# Patient Record
Sex: Female | Born: 1990 | Race: Black or African American | Hispanic: No | Marital: Single | Smoking: Current every day smoker
Health system: Southern US, Community
[De-identification: ages and names within clinical notes are randomized; demographics above are authoritative.]

## PROBLEM LIST (undated history)

## (undated) ENCOUNTER — Inpatient Hospital Stay (HOSPITAL_COMMUNITY): Payer: Self-pay

## (undated) DIAGNOSIS — F3281 Premenstrual dysphoric disorder: Secondary | ICD-10-CM

## (undated) DIAGNOSIS — G44009 Cluster headache syndrome, unspecified, not intractable: Secondary | ICD-10-CM

## (undated) DIAGNOSIS — R112 Nausea with vomiting, unspecified: Secondary | ICD-10-CM

## (undated) DIAGNOSIS — I10 Essential (primary) hypertension: Secondary | ICD-10-CM

## (undated) DIAGNOSIS — Z20828 Contact with and (suspected) exposure to other viral communicable diseases: Secondary | ICD-10-CM

## (undated) DIAGNOSIS — N390 Urinary tract infection, site not specified: Secondary | ICD-10-CM

## (undated) HISTORY — PX: NO PAST SURGERIES: SHX2092

## (undated) HISTORY — PX: WISDOM TOOTH EXTRACTION: SHX21

## (undated) HISTORY — DX: Essential (primary) hypertension: I10

---
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
## 2011-04-09 ENCOUNTER — Emergency Department (HOSPITAL_COMMUNITY)
Admission: EM | Admit: 2011-04-09 | Discharge: 2011-04-10 | Disposition: A | Discharge status: Home / Self Care | Payer: Medicaid Other | Attending: Emergency Medicine | Admitting: Emergency Medicine

## 2011-04-09 DIAGNOSIS — R070 Pain in throat: Secondary | ICD-10-CM | POA: Insufficient documentation

## 2011-04-09 MED ORDER — ONDANSETRON 8 MG PO TBDP
8.0000 mg | ORAL_TABLET | Freq: Once | ORAL | Status: AC
  Administered 2011-04-09: 8 mg via ORAL
  Filled 2011-04-09: qty 1

## 2011-04-09 NOTE — ED Notes (Signed)
Sore thraot and chills for one day

## 2011-04-09 NOTE — ED Notes (Signed)
Pt also vomiting as well.  Symptoms started today.

## 2011-04-10 NOTE — ED Notes (Signed)
Pt's mother wanting to leave, AMA paper signed

## 2011-04-11 ENCOUNTER — Encounter (HOSPITAL_COMMUNITY): Payer: Self-pay | Admitting: *Deleted

## 2011-04-11 ENCOUNTER — Emergency Department (HOSPITAL_COMMUNITY)
Admission: EM | Admit: 2011-04-11 | Discharge: 2011-04-11 | Disposition: A | Discharge status: Home / Self Care | Payer: Medicaid Other | Attending: Emergency Medicine | Admitting: Emergency Medicine

## 2011-04-11 DIAGNOSIS — R52 Pain, unspecified: Secondary | ICD-10-CM | POA: Insufficient documentation

## 2011-04-11 DIAGNOSIS — R509 Fever, unspecified: Secondary | ICD-10-CM | POA: Insufficient documentation

## 2011-04-11 DIAGNOSIS — J029 Acute pharyngitis, unspecified: Secondary | ICD-10-CM | POA: Insufficient documentation

## 2011-04-11 DIAGNOSIS — R1110 Vomiting, unspecified: Secondary | ICD-10-CM | POA: Insufficient documentation

## 2011-04-11 DIAGNOSIS — E860 Dehydration: Secondary | ICD-10-CM | POA: Insufficient documentation

## 2011-04-11 LAB — URINALYSIS, ROUTINE W REFLEX MICROSCOPIC
Glucose, UA: NEGATIVE mg/dL
Leukocytes, UA: NEGATIVE
Nitrite: NEGATIVE
Protein, ur: 30 mg/dL — AB
Specific Gravity, Urine: 1.025 (ref 1.005–1.030)

## 2011-04-11 LAB — BASIC METABOLIC PANEL
BUN: 9 mg/dL (ref 6–23)
Calcium: 9.2 mg/dL (ref 8.4–10.5)
Creatinine, Ser: 0.74 mg/dL (ref 0.50–1.10)

## 2011-04-11 LAB — URINE MICROSCOPIC-ADD ON

## 2011-04-11 MED ORDER — ACETAMINOPHEN 160 MG/5ML PO SOLN
650.0000 mg | Freq: Once | ORAL | Status: AC
  Administered 2011-04-11: 650 mg via ORAL
  Filled 2011-04-11: qty 20.3

## 2011-04-11 MED ORDER — POTASSIUM CHLORIDE CRYS ER 20 MEQ PO TBCR
20.0000 meq | EXTENDED_RELEASE_TABLET | Freq: Once | ORAL | Status: AC
  Administered 2011-04-11: 20 meq via ORAL
  Filled 2011-04-11: qty 1

## 2011-04-11 MED ORDER — IBUPROFEN 800 MG PO TABS
800.0000 mg | ORAL_TABLET | Freq: Once | ORAL | Status: AC
  Administered 2011-04-11: 800 mg via ORAL
  Filled 2011-04-11: qty 1

## 2011-04-11 MED ORDER — IBUPROFEN 800 MG PO TABS: 800.0000 mg | ORAL_TABLET | Freq: Three times a day (TID) | ORAL | Status: AC | PRN

## 2011-04-11 MED ORDER — SODIUM CHLORIDE 0.9 % IV BOLUS (SEPSIS)
1000.0000 mL | Freq: Once | INTRAVENOUS | Status: AC
  Administered 2011-04-11: 1000 mL via INTRAVENOUS

## 2011-04-11 NOTE — ED Notes (Signed)
Pt states sore throat began yesterday, states feel like something is blocking thoat, feels swollen.  Pt states vomiting. Last vomited last night. Fever of 102.5 in triage. HR 127

## 2011-04-11 NOTE — ED Provider Notes (Signed)
History     Chief Complaint  Patient presents with  . Fever    sore thorat, body aches, vomiting   HPI Comments: She was seen here 2 days ago and her strep test was negative.  She has continued throat pain and has had difficulty eating and drinking fluids.  She feels weak and dehydrated.  Patient is a 20 y.o. female presenting with fever. The history is provided by the patient and a parent.  Fever Primary symptoms of the febrile illness include fever. Primary symptoms do not include headaches, shortness of breath, abdominal pain, nausea, arthralgias or rash. The current episode started 2 days ago. This is a new problem. The problem has not changed since onset. The fever began 2 days ago. The maximum temperature recorded prior to her arrival was 102 to 102.9 F.  Risk factors: none.Primary symptoms comment: sore throat and nasal congestion    History reviewed. No pertinent past medical history.  History reviewed. No pertinent past surgical history.  No family history on file.  History  Substance Use Topics  . Smoking status: Never Smoker   . Smokeless tobacco: Not on file  . Alcohol Use: No    OB History    Grav Para Term Preterm Abortions TAB SAB Ect Mult Living                  Review of Systems  Constitutional: Positive for fever.  HENT: Positive for congestion, sore throat, rhinorrhea and trouble swallowing. Negative for neck pain, neck stiffness and ear discharge.   Eyes: Negative.   Respiratory: Negative for chest tightness and shortness of breath.   Cardiovascular: Negative for chest pain.  Gastrointestinal: Negative for nausea and abdominal pain.  Genitourinary: Negative.   Musculoskeletal: Negative for joint swelling and arthralgias.  Skin: Negative.  Negative for rash and wound.  Neurological: Positive for weakness. Negative for dizziness, light-headedness, numbness and headaches.  Hematological: Negative.   Psychiatric/Behavioral: Negative.     Physical  Exam  BP 137/86  Pulse 126  Temp(Src) 99.7 F (37.6 C) (Oral)  Resp 20  Ht 5' 2.5" (1.588 m)  Wt 172 lb (78.019 kg)  BMI 30.96 kg/m2  SpO2 100%  LMP 04/06/2011  Physical Exam  Vitals reviewed. Constitutional: She is oriented to person, place, and time. She appears well-developed and well-nourished. No distress.  HENT:  Head: Normocephalic and atraumatic.  Right Ear: Tympanic membrane, external ear and ear canal normal.  Left Ear: Tympanic membrane, external ear and ear canal normal.  Nose: Mucosal edema and rhinorrhea present.  Mouth/Throat: Uvula is midline and mucous membranes are normal. Posterior oropharyngeal erythema present. No oropharyngeal exudate, posterior oropharyngeal edema or tonsillar abscesses.  Eyes: Conjunctivae are normal.  Neck: Normal range of motion. Neck supple.  Cardiovascular: Normal rate, regular rhythm, normal heart sounds and intact distal pulses.   Pulmonary/Chest: Effort normal and breath sounds normal. She has no wheezes.  Abdominal: Soft. Bowel sounds are normal. There is no tenderness.  Musculoskeletal: Normal range of motion.  Neurological: She is alert and oriented to person, place, and time.  Skin: Skin is warm and dry.  Psychiatric: She has a normal mood and affect.    ED Course  Procedures  MDM       Candis Musa, [place] 04/11/11 1727

## 2011-06-09 NOTE — ED Provider Notes (Signed)
Medical screening examination/treatment/procedure(s) were performed by non-physician practitioner and as supervising physician I was immediately available for consultation/collaboration.  Nicholes Stairs, MD 06/09/11 1540

## 2011-07-04 LAB — DIFFERENTIAL
Eosinophils Absolute: 0.1 — ABNORMAL LOW
Lymphocytes Relative: 43
Monocytes Relative: 9
Neutro Abs: 3.5
Neutrophils Relative %: 46

## 2011-07-04 LAB — BASIC METABOLIC PANEL
BUN: 11
Calcium: 9.3
Creatinine, Ser: 0.68
Potassium: 4

## 2011-07-04 LAB — CBC
HCT: 34.3
MCHC: 31.6
MCV: 77.7

## 2011-09-10 ENCOUNTER — Emergency Department (HOSPITAL_COMMUNITY)
Admission: EM | Admit: 2011-09-10 | Discharge: 2011-09-10 | Disposition: A | Discharge status: Home / Self Care | Payer: Self-pay | Attending: Emergency Medicine | Admitting: Emergency Medicine

## 2011-09-10 ENCOUNTER — Encounter (HOSPITAL_COMMUNITY): Payer: Self-pay | Admitting: Emergency Medicine

## 2011-09-10 DIAGNOSIS — B9789 Other viral agents as the cause of diseases classified elsewhere: Secondary | ICD-10-CM | POA: Insufficient documentation

## 2011-09-10 DIAGNOSIS — B349 Viral infection, unspecified: Secondary | ICD-10-CM

## 2011-09-10 HISTORY — DX: Contact with and (suspected) exposure to other viral communicable diseases: Z20.828

## 2011-09-10 HISTORY — DX: Cluster headache syndrome, unspecified, not intractable: G44.009

## 2011-09-10 MED ORDER — IBUPROFEN 400 MG PO TABS
400.0000 mg | ORAL_TABLET | Freq: Once | ORAL | Status: AC
  Administered 2011-09-10: 400 mg via ORAL
  Filled 2011-09-10: qty 1

## 2011-09-10 MED ORDER — ACETAMINOPHEN 500 MG PO TABS
1000.0000 mg | ORAL_TABLET | Freq: Once | ORAL | Status: AC
  Administered 2011-09-10: 1000 mg via ORAL
  Filled 2011-09-10: qty 2

## 2011-09-10 NOTE — ED Notes (Signed)
Pt c/o fever body aches runny eyes and cough. Pt states she was exposed to flu at work.

## 2011-09-10 NOTE — ED Provider Notes (Signed)
History     CSN: [id number] Arrival date & time: 09/10/2011  4:27 PM      Chief Complaint  Patient presents with  . Headache  . Fever  . Emesis  . Nausea    HPI Pt was seen at 1755.  Per pt, c/o gradual onset and persistence of constant runny/stuffy nose, sinus congestion, generalized body aches/fatigue, home fevers/chills, cough with post-tussive emesis, nausea, and "runny eyes" for the past day.  Pt states she was exposed to the flu at work, with 4 people having the same symptoms.  Denies CP/SOB, no abd pain, no diarrhea, no rash.    Past Medical History  Diagnosis Date  . 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 Headaches, cluster     History reviewed. No pertinent past surgical history.   History  Substance Use Topics  . Smoking status: Never Smoker   . Smokeless tobacco: Not on file  . Alcohol Use: No    Review of Systems ROS: Statement: All systems negative except as marked or noted in the HPI; Constitutional: +fever and chills, generalized body aches/fatigue. ; ; Eyes: Negative for eye pain, redness and discharge. ; ; ENMT: Negative for ear pain, hoarseness, sore throat, +rhinorrhea, nasal congestion, sinus pressure. ; ; Cardiovascular: Negative for chest pain, palpitations, diaphoresis, dyspnea and peripheral edema. ; ; Respiratory: +cough. Negative for wheezing and stridor. ; ; Gastrointestinal: +post tussive emesis, nausea.  Negative for diarrhea, abdominal pain, blood in stool, hematemesis, jaundice and rectal bleeding. . ; ; Genitourinary: Negative for dysuria, flank pain and hematuria. ; ; Musculoskeletal: +LBP.  Negative for neck pain. Negative for swelling and trauma.; ; Skin: Negative for pruritus, rash, abrasions, blisters, bruising and skin lesion.; ; Neuro: Negative for headache, lightheadedness and neck stiffness. Negative for weakness, altered level of consciousness , altered mental status, extremity weakness, paresthesias, involuntary movement, seizure and syncope.     Allergies    Latex  Home Medications  No current outpatient prescriptions on file.  BP 135/84  Pulse 115  Temp(Src) 101.5 F (38.6 C) (Oral)  Resp 20  Ht 5\' 2"  (1.575 m)  Wt 155 lb 5 oz (70.449 kg)  BMI 28.41 kg/m2  SpO2 100%  LMP 08/30/2011  Physical Exam 1800: Physical examination:  Nursing notes reviewed; Vital signs and O2 SAT reviewed;  Constitutional: Well developed, Well nourished, Well hydrated, In no acute distress; Head:  Normocephalic, atraumatic; Eyes: EOMI, PERRL, No scleral icterus; ENMT: TM's clear bilat.  +edemetous nasal turbinates bilat with clear rhinorrhea. Mouth and pharynx normal, Mucous membranes moist; Neck: Supple, Full range of motion, No lymphadenopathy; Cardiovascular: Tachycardia rate and regular rhythm, No murmur, rub, or gallop; Respiratory: Breath sounds clear & equal bilaterally, No rales, rhonchi, wheezes, or rub, Normal respiratory effort/excursion; Chest: Nontender, Movement normal; Abdomen: Soft, Nontender, Nondistended, Normal bowel sounds; Genitourinary: No CVA tenderness; Spine:  No midline CS, TS, LS tenderness.  +TTP bilat lower lumbar paraspinal muscles.  Extremities: Pulses normal, No tenderness, No edema, No calf edema or asymmetry.; Neuro: AA&Ox3, Major CN grossly intact.  No gross focal motor or sensory deficits in extremities.; Skin: Color normal, Warm, Dry, no rash, no petechiae.   ED Course  Procedures   MDM  MDM Reviewed: nursing note and vitals   Appears viral process at this time, will tx symptomatically.       Cohen Doleman Allison Quarry, DO 09/11/11 1947

## 2011-11-10 ENCOUNTER — Emergency Department (HOSPITAL_COMMUNITY)
Admission: EM | Admit: 2011-11-10 | Discharge: 2011-11-11 | Disposition: A | Discharge status: Home / Self Care | Payer: Self-pay | Attending: Emergency Medicine | Admitting: Emergency Medicine

## 2011-11-10 ENCOUNTER — Encounter (HOSPITAL_COMMUNITY): Payer: Self-pay | Admitting: *Deleted

## 2011-11-10 ENCOUNTER — Emergency Department (HOSPITAL_COMMUNITY): Payer: Self-pay

## 2011-11-10 DIAGNOSIS — M25476 Effusion, unspecified foot: Secondary | ICD-10-CM | POA: Insufficient documentation

## 2011-11-10 DIAGNOSIS — S82831A Other fracture of upper and lower end of right fibula, initial encounter for closed fracture: Secondary | ICD-10-CM

## 2011-11-10 DIAGNOSIS — W19XXXA Unspecified fall, initial encounter: Secondary | ICD-10-CM | POA: Insufficient documentation

## 2011-11-10 DIAGNOSIS — M25473 Effusion, unspecified ankle: Secondary | ICD-10-CM | POA: Insufficient documentation

## 2011-11-10 DIAGNOSIS — S82899A Other fracture of unspecified lower leg, initial encounter for closed fracture: Secondary | ICD-10-CM | POA: Insufficient documentation

## 2011-11-10 DIAGNOSIS — M25579 Pain in unspecified ankle and joints of unspecified foot: Secondary | ICD-10-CM | POA: Insufficient documentation

## 2011-11-10 MED ORDER — HYDROCODONE-ACETAMINOPHEN 5-325 MG PO TABS
1.0000 | ORAL_TABLET | Freq: Once | ORAL | Status: AC
  Administered 2011-11-10: 1 via ORAL
  Filled 2011-11-10: qty 1

## 2011-11-10 MED ORDER — HYDROCODONE-ACETAMINOPHEN 5-325 MG PO TABS: 1.0000 | ORAL_TABLET | Freq: Four times a day (QID) | ORAL | Status: AC | PRN

## 2011-11-10 NOTE — ED Notes (Signed)
Patient given ice for right ankle per request. Denies any other needs. awaiting to be seen. Call bell and family at bedside. Will continue to monitor.

## 2011-11-10 NOTE — ED Notes (Signed)
MD at bedside. 

## 2011-11-10 NOTE — ED Notes (Signed)
MD at bedside to evaluate.

## 2011-11-10 NOTE — ED Notes (Signed)
Fell , pain rt ankle and foot.

## 2011-11-10 NOTE — ED Notes (Signed)
Patient report given to this nurse. Assuming care of patient. In no distress. Family with patient. Denies any needs at this time. Awaiting to be seen. Call bell within reach.

## 2011-11-10 NOTE — ED Provider Notes (Addendum)
Chief complaint: Fall with right ankle pain  History of present illness: This 21 year old black female who states she fell down a hill this evening about 10 PM. She is complaining of pain in her right lateral ankle. The pain is moderate to severe, worse with movement of the right ankle or palpation. She denies other injury. It was treated with an ice pack prior to my evaluation.  Past Medical History  Diagnosis Date  . 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 Headaches, cluster    History   Social History  . Marital Status: Single    Spouse Name: N/A    Number of Children: N/A  . Years of Education: N/A   Occupational History  . Not on file.   Social History Main Topics  . Smoking status: Never Smoker   . Smokeless tobacco: Not on file  . Alcohol Use: No  . Drug Use: No  . Sexually Active: Yes    Birth Control/ Protection: None   Other Topics Concern  . Not on file   Social History Narrative  . No narrative on file   ROS: All systems reviewed are negative except as noted in the history of present illness.  PE: General: Well-developed, well-nourished female in no acute distress; appearance consistent with age of record HENT: normocephalic, atraumatic Eyes: normal appearance Neck: supple; no C-spine tenderness Heart: regular rate and rhythm Lungs: normal effort and excursion Chest: nontender Abdomen: soft; nondistended; nondistended Back: No T-spine or LS-spine tenderness Neurologic: Awake, alert and oriented; motor function intact in all extremities and symmetric; no facial droop Skin: Warm and dry Extremities: normal except for tenderness and swelling right lateral malleolus of the ankle; decreased range of motion of right ankle and right toes due to pain, but tendon function, motor function and sensation intact distally with brisk capillary refill distally    Patient advised of x-ray findings and need for followup with orthopedist. Patient was advised that this nondisplaced fracture  is likely not require surgery but the final decision will be made by the orthopedist.  Hanley Seamen, MD 11/10/11 2348  Hanley Seamen, MD 11/10/11 4098

## 2011-11-10 NOTE — Discharge Instructions (Signed)
Ankle Fracture A fracture is a break in the bone. A cast or splint is used to protect and keep your injured bone from moving.  HOME CARE INSTRUCTIONS   Use your crutches as directed.   To lessen the swelling, keep the injured leg elevated while sitting or lying down.   Apply ice to the injury for 15 to 20 minutes, 3 to 4 times per day while awake for 2 days. Put the ice in a plastic bag and place a thin towel between the bag of ice and your cast.   If you have a plaster splint:   Wear the splint as directed.   You may loosen the elastic around the splint if your toes become numb, tingle, or turn cold or blue.   Do not put pressure on any part of your cast or splint; it may break. Rest your cast only on a pillow the first 24 hours until it is fully hardened.   Your cast or splint can be protected during bathing with a plastic bag. Do not lower the cast or splint into water.   Take medications as directed by your caregiver. Only take over-the-counter or prescription medicines for pain, discomfort, or fever as directed by your caregiver.   Do not drive a vehicle until your caregiver specifically tells you it is safe to do so.   If your caregiver has given you a follow-up appointment, it is very important to keep that appointment. Not keeping the appointment could result in a chronic or permanent injury, pain, and disability. If there is any problem keeping the appointment, you must call back to this facility for assistance.  SEEK IMMEDIATE MEDICAL CARE IF:   You have continued severe pain or more swelling than you did before the cast was put on.   Your skin or toenails below the injury turn blue or gray, or feel cold or numb.   There is a bad smell or new stains and/or purulent (pus like) drainage coming from under the cast.  If you do not have a window in your cast for observing the wound, a discharge or minor bleeding may show up as a stain on the outside of your cast. Report these  findings to your caregiver. MAKE SURE YOU:   Understand these instructions.   Will watch your condition.   Will get help right away if you are not doing well or get worse.  Document Released: 09/09/2000 Document Revised: 05/25/2011 Document Reviewed: 04/15/2008 [facility] Patient Information 2012 [place], [place].

## 2011-11-11 ENCOUNTER — Emergency Department (HOSPITAL_COMMUNITY)
Admission: EM | Admit: 2011-11-11 | Discharge: 2011-11-11 | Disposition: A | Discharge status: Home / Self Care | Payer: Self-pay | Attending: Orthopaedic Surgery | Admitting: Orthopaedic Surgery

## 2011-11-11 ENCOUNTER — Encounter (HOSPITAL_COMMUNITY): Payer: Self-pay | Admitting: *Deleted

## 2011-11-11 DIAGNOSIS — Z09 Encounter for follow-up examination after completed treatment for conditions other than malignant neoplasm: Secondary | ICD-10-CM | POA: Insufficient documentation

## 2011-11-11 NOTE — Consult Note (Signed)
Reason for Consult:fracture right ankle Referring Physician: ER  Bethany Gomez is an 21 y.o. female.  HPI: She fell in her yard yesterday and hurt her right ankle.  X-rays show a nondisplaced lateral malleolus fracture.  She was given crutches and a posterior splint.  She has no other injury.  Past Medical History  Diagnosis Date  . 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 Headaches, cluster     History reviewed. No pertinent past surgical history.  No family history on file.  Social History:  reports that she has never smoked. She does not have any smokeless tobacco history on file. She reports that she does not drink alcohol or use illicit drugs.  Allergies:  Allergies  Allergen Reactions  . Latex Hives    Medications: I have reviewed the patient's current medications.  No results found for this or any previous visit (from the past 48 hour(s)).  Dg Ankle Complete Right  11/10/2011  *RADIOLOGY REPORT*  Clinical Data: Right lateral ankle pain and swelling.  Status post fall.  RIGHT ANKLE - COMPLETE 3+ VIEW  Comparison: None available.  Findings: An oblique fracture is present within the distal fibula. There is minimal displacement.  The ankle joint is located.  No significant joint effusion is present. Soft tissue swelling overlies the fracture.  IMPRESSION: Minimally-displaced distal fibular fracture with associated soft tissue swelling.  Original Report Authenticated By: Jamesetta Orleans. MATTERN, M.D.    Review of Systems  Constitutional: Negative.   HENT: Negative.   Eyes: Negative.   Respiratory: Negative.   Cardiovascular: Negative.   Gastrointestinal: Negative.   Genitourinary: Negative.   Musculoskeletal: Positive for falls (she fell and hurt the right ankle).  Skin: Negative.   Neurological: Negative.   Endo/Heme/Allergies: Negative.   Psychiatric/Behavioral: Negative.    Blood pressure 120/82, pulse 91, temperature 98.7 F (37.1 C), temperature source Oral, resp. rate 16, height 5\' 2"   (1.575 m), weight 72.576 kg (160 lb), last menstrual period 10/25/2011, SpO2 100.00%. Physical Exam  Constitutional: She is oriented to person, place, and time. She appears well-developed and well-nourished.  HENT:  Head: Normocephalic and atraumatic.  Eyes: Conjunctivae and EOM are normal. Pupils are equal, round, and reactive to light.  Neck: Normal range of motion. Neck supple.  Cardiovascular: Normal rate, regular rhythm, normal heart sounds and intact distal pulses.   Respiratory: Effort normal and breath sounds normal.  GI: Bowel sounds are normal.  Musculoskeletal: She exhibits edema and tenderness (right ankle laterally).       Right ankle: She exhibits decreased range of motion, swelling and ecchymosis. tenderness. Lateral malleolus tenderness found.       Feet:  Neurological: She is alert and oriented to person, place, and time. She has normal reflexes.  Skin: Skin is warm and dry.  Psychiatric: She has a normal mood and affect. Her behavior is normal. Judgment and thought content normal.    Assessment/Plan: Lateral malleolus fracture nondisplaced right.  She will be changed to a CAM walker.  Continue crutches and no weight bearing.  Continue present medicine.  To be seen in office next week. Call if any problem.  Emaad Nanna 11/11/2011, 11:51 AM

## 2011-11-11 NOTE — ED Notes (Signed)
Into room to splint patient's right leg with short leg splint.

## 2011-11-11 NOTE — ED Notes (Signed)
Pt presents to er for cast placement by Dr. Hilda Lias, pt was seen in er last pm for fx of right lower leg

## 2011-11-11 NOTE — ED Notes (Signed)
Patient assisted to bathroom via wheelchair.

## 2011-11-11 NOTE — ED Notes (Signed)
Pt was here in ED last night, dx with broken leg.  To meet Dr. Hilda Lias here at 1030 for recheck.

## 2011-11-11 NOTE — ED Notes (Signed)
Applied splint to right leg. Used two ace wraps, one three inch orthoglass, and splint padding. Patient tolerated well. Fitted for crutches.

## 2011-11-11 NOTE — Discharge Instructions (Signed)
Wear cam walker, follow up with Dr. Hilda Lias office, perform contrast soaks, continue to use crutches.

## 2011-11-16 NOTE — ED Provider Notes (Signed)
History     CSN: [id number]  Arrival date & time 11/11/11  1018   First MD Initiated Contact with Patient 11/11/11 1052      Chief Complaint  Patient presents with  . Wound Check    (Consider location/radiation/quality/duration/timing/severity/associated sxs/prior treatment) Patient is a 21 y.o. female presenting with wound check.  Wound Check     Past Medical History  Diagnosis Date  . 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 Headaches, cluster     History reviewed. No pertinent past surgical history.  No family history on file.  History  Substance Use Topics  . Smoking status: Never Smoker   . Smokeless tobacco: Not on file  . Alcohol Use: No    OB History    Grav Para Term Preterm Abortions TAB SAB Ect Mult Living                  Review of Systems  Allergies  Latex  Home Medications   Current Outpatient Rx  Name Route Sig Dispense Refill  . ACETAMINOPHEN 500 MG PO TABS Oral Take 1,000 mg by mouth every 6 (six) hours as needed. pain    . HYDROCODONE-ACETAMINOPHEN 5-325 MG PO TABS Oral Take 1-2 tablets by mouth every 6 (six) hours as needed for pain. 20 tablet 0    BP 120/82  Pulse 91  Temp(Src) 98.7 F (37.1 C) (Oral)  Resp 16  Ht 5\' 2"  (1.575 m)  Wt 160 lb (72.576 kg)  BMI 29.26 kg/m2  SpO2 100%  LMP 10/25/2011  Physical Exam  ED Course  Procedures (including critical care time)  Labs Reviewed - No data to display No results found.   No diagnosis found.    MDM  i signed up to see pt but was then told that the pt was instructed to come to the ED and dr. Hilda Lias would evaluate him.  i did not see the pt.        Worthy Rancher, PA 11/16/11 2340

## 2011-11-22 NOTE — ED Provider Notes (Signed)
Evaluation and management procedures were performed by the PA/NP under my supervision/collaboration.    Felisa Bonier, MD 11/22/11 2005

## 2011-11-27 ENCOUNTER — Other Ambulatory Visit (HOSPITAL_COMMUNITY): Payer: Self-pay | Admitting: Orthopaedic Surgery

## 2011-11-27 ENCOUNTER — Ambulatory Visit (HOSPITAL_COMMUNITY)
Admission: RE | Admit: 2011-11-27 | Discharge: 2011-11-27 | Disposition: A | Discharge status: Home / Self Care | Payer: Self-pay | Source: Ambulatory Visit | Attending: Orthopaedic Surgery | Admitting: Orthopaedic Surgery

## 2011-11-27 DIAGNOSIS — X58XXXA Exposure to other specified factors, initial encounter: Secondary | ICD-10-CM | POA: Insufficient documentation

## 2011-11-27 DIAGNOSIS — R52 Pain, unspecified: Secondary | ICD-10-CM

## 2011-11-27 DIAGNOSIS — S82409A Unspecified fracture of shaft of unspecified fibula, initial encounter for closed fracture: Secondary | ICD-10-CM | POA: Insufficient documentation

## 2012-01-08 ENCOUNTER — Emergency Department (HOSPITAL_COMMUNITY)
Admission: EM | Admit: 2012-01-08 | Discharge: 2012-01-08 | Disposition: A | Discharge status: Home / Self Care | Payer: Self-pay | Attending: Emergency Medicine | Admitting: Emergency Medicine

## 2012-01-08 ENCOUNTER — Encounter (HOSPITAL_COMMUNITY): Payer: Self-pay

## 2012-01-08 DIAGNOSIS — R509 Fever, unspecified: Secondary | ICD-10-CM | POA: Insufficient documentation

## 2012-01-08 DIAGNOSIS — R112 Nausea with vomiting, unspecified: Secondary | ICD-10-CM | POA: Insufficient documentation

## 2012-01-08 DIAGNOSIS — R209 Unspecified disturbances of skin sensation: Secondary | ICD-10-CM | POA: Insufficient documentation

## 2012-01-08 LAB — CBC
HCT: 32.7 % — ABNORMAL LOW (ref 36.0–46.0)
Hemoglobin: 9.6 g/dL — ABNORMAL LOW (ref 12.0–15.0)
MCH: 21.6 pg — ABNORMAL LOW (ref 26.0–34.0)
MCV: 73.6 fL — ABNORMAL LOW (ref 78.0–100.0)
Platelets: 453 10*3/uL — ABNORMAL HIGH (ref 150–400)
RBC: 4.44 MIL/uL (ref 3.87–5.11)
WBC: 16.7 10*3/uL — ABNORMAL HIGH (ref 4.0–10.5)

## 2012-01-08 LAB — PREGNANCY, URINE: Preg Test, Ur: NEGATIVE

## 2012-01-08 LAB — BASIC METABOLIC PANEL
BUN: 15 mg/dL (ref 6–23)
CO2: 22 mEq/L (ref 19–32)
Calcium: 9.6 mg/dL (ref 8.4–10.5)
Chloride: 100 mEq/L (ref 96–112)
Creatinine, Ser: 0.69 mg/dL (ref 0.50–1.10)
Glucose, Bld: 89 mg/dL (ref 70–99)

## 2012-01-08 LAB — URINALYSIS, ROUTINE W REFLEX MICROSCOPIC
Bilirubin Urine: NEGATIVE
Glucose, UA: NEGATIVE mg/dL
Nitrite: NEGATIVE
Specific Gravity, Urine: 1.025 (ref 1.005–1.030)
pH: 5.5 (ref 5.0–8.0)

## 2012-01-08 MED ORDER — ACETAMINOPHEN 325 MG PO TABS
650.0000 mg | ORAL_TABLET | Freq: Once | ORAL | Status: AC
  Administered 2012-01-08: 650 mg via ORAL
  Filled 2012-01-08: qty 2

## 2012-01-08 NOTE — Discharge Instructions (Signed)
Fever  Fever is a higher-than-normal body temperature. A normal temperature varies with:  Age.   How it is measured (mouth, underarm, rectal, or ear).   Time of day.  In an adult, an oral temperature around 98.6 Fahrenheit (F) or 37 Celsius (C) is considered normal. A rise in temperature of about 1.8 F or 1 C is generally considered a fever (100.4 F or 38 C). In an infant age 20 days or less, a rectal temperature of 100.4 F (38 C) generally is regarded as fever. Fever is not a disease but can be a symptom of illness. CAUSES   Fever is most commonly caused by infection.   Some non-infectious problems can cause fever. For example:   Some arthritis problems.   Problems with the thyroid or adrenal glands.   Immune system problems.   Some kinds of cancer.   A reaction to certain medicines.   Occasionally, the source of a fever cannot be determined. This is sometimes called a "Fever of Unknown Origin" (FUO).   Some situations may lead to a temporary rise in body temperature that may go away on its own. Examples are:   Childbirth.   Surgery.   Some situations may cause a rise in body temperature but these are not considered "true fever". Examples are:   Intense exercise.   Dehydration.   Exposure to high outside or room temperatures.  SYMPTOMS   Feeling warm or hot.   Fatigue or feeling exhausted.   Aching all over.   Chills.   Shivering.   Sweats.  DIAGNOSIS  A fever can be suspected by your caregiver feeling that your skin is unusually warm. The fever is confirmed by taking a temperature with a thermometer. Temperatures can be taken different ways. Some methods are accurate and some are not: With adults, adolescents, and children:   An oral temperature is used most commonly.   An ear thermometer will only be accurate if it is positioned as recommended by the manufacturer.   Under the arm temperatures are not accurate and not recommended.   Most  electronic thermometers are fast and accurate.  Infants and Toddlers:  Rectal temperatures are recommended and most accurate.   Ear temperatures are not accurate in this age group and are not recommended.   Skin thermometers are not accurate.  RISKS AND COMPLICATIONS   During a fever, the body uses more oxygen, so a person with a fever may develop rapid breathing or shortness of breath. This can be dangerous especially in people with heart or lung disease.   The sweats that occur following a fever can cause dehydration.   High fever can cause seizures in infants and children.   Older persons can develop confusion during a fever.  TREATMENT   Medications may be used to control temperature.   Do not give aspirin to children with fevers. There is an association with Reye's syndrome. Reye's syndrome is a rare but potentially deadly disease.   If an infection is present and medications have been prescribed, take them as directed. Finish the full course of medications until they are gone.   Sponging or bathing with room-temperature water may help reduce body temperature. Do not use ice water or alcohol sponge baths.   Do not over-bundle children in blankets or heavy clothes.   Drinking adequate fluids during an illness with fever is important to prevent dehydration.  HOME CARE INSTRUCTIONS   For adults, rest and adequate fluid intake are important. Dress according   to how you feel, but do not over-bundle.   Drink enough water and/or fluids to keep your urine clear or pale yellow.   For infants over 3 months and children, giving medication as directed by your caregiver to control fever can help with comfort. The amount to be given is based on the child's weight. Do NOT give more than is recommended.  SEEK MEDICAL CARE IF:   You or your child are unable to keep fluids down.   Vomiting or diarrhea develops.   You develop a skin rash.   An oral temperature above 102 F (38.9 C)  develops, or a fever which persists for over 3 days.   You develop excessive weakness, dizziness, fainting or extreme thirst.   Fevers keep coming back after 3 days.  SEEK IMMEDIATE MEDICAL CARE IF:   Shortness of breath or trouble breathing develops   You pass out.   You feel you are making little or no urine.   New pain develops that was not there before (such as in the head, neck, chest, back, or abdomen).   You cannot hold down fluids.   Vomiting and diarrhea persist for more than a day or two.   You develop a stiff neck and/or your eyes become sensitive to light.   An unexplained temperature above 102 F (38.9 C) develops.  Document Released: 09/12/2005 Document Revised: 09/01/2011 Document Reviewed: 08/28/2008 [facility] Patient Information 2012 [place], [place].  Recommend taking Tylenol and/or Motrin for the fever and body aches. Most likely a viral illness return for any new or worse symptoms or if not better in 2 days followup with her regular doctor. Today's lab workup without any sniffing findings other than the leukocytosis which means elevation of your white blood cell count.

## 2012-01-08 NOTE — ED Notes (Signed)
Iv attempt x 3 without success. Advised will wait for edp. Nad.

## 2012-01-08 NOTE — ED Notes (Signed)
Pt presents with headache, nausea, and numbness to bilateral hands that started today.

## 2012-01-08 NOTE — ED Provider Notes (Signed)
History  This chart was scribed for Bethany Jakes, MD by Bennett Scrape and Temilola Ajibulu. This patient was seen in room APA07/APA07 and the patient's care was started at 3:40PM.  CSN: [id number]  Arrival date & time 01/08/12  1418   First MD Initiated Contact with Patient 01/08/12 1507      Chief Complaint  Patient presents with  . Headache  . Nausea  . Numbness    The history is provided by the patient. No language interpreter was used.     Bethany Gomez is a 21 y.o. female who presents to the Emergency Department complaining of 15 hours of gradual onset, gradually worsening, constant numbness described as tingling in bilateral hands and feet with associated fever, nausea, emesis and HA that started after she woke up this morning. Fever was unmeasured at home. Fever was measured 101.1 in the ED. She reports one episode of non-bloody emesis approximately 1 to 2 hours ago. She states that she no longer feels nauseated. She states that the HA is temporally located on both sides and she rates it a 7 out of 10 currently. Pt reports that she experienced prior episodes of similar numbness with the last time being July 2012. Pt claims that during her last episode she received IV fluids for dehydration and was diagnosed with viral pharyngitis. 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 She denies having a HA yesterday or the day before. She reports that she wanted to come to the hospital before her symptoms got worse. She also states that she has not urinated today stating that her last urination was "sometime yesterday". Pt denies taking any medication to relieve her symptoms. Pt denies any modifying factors. Pt  denies chills, sore throat, congestion, visual disturbance, chest pain, back pain, dysuria, abdominal pain, diarrhea, weakness and rash as associated symptoms. Other than the cluster HAs, she  has no h/o chronic medical conditions. Pt denies a h/o smoking and alcohol use.  She has no current PCP.  Past Medical History  Diagnosis Date  . 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 Headaches, cluster     History reviewed. No pertinent past surgical history.  No family history on file.  History  Substance Use Topics  . Smoking status: Never Smoker   . Smokeless tobacco: Not on file  . Alcohol Use: No    Review of Systems  Constitutional: Positive for fever. Negative for chills.  HENT: Negative for congestion, sore throat and neck pain.   Eyes: Negative for visual disturbance.  Respiratory: Negative for cough.   Cardiovascular: Negative for chest pain.  Gastrointestinal: Positive for nausea and vomiting. Negative for abdominal pain and diarrhea.  Genitourinary: Negative for dysuria, urgency and hematuria.  Musculoskeletal: Negative for back pain.  Skin: Negative for rash.  Neurological: Positive for numbness and headaches. Negative for seizures.  Hematological: Does not bruise/bleed easily.  Psychiatric/Behavioral: Negative for confusion.    Allergies  Latex  Home Medications   Current Outpatient Rx  Name Route Sig Dispense Refill  . ACETAMINOPHEN 500 MG PO TABS Oral Take 1,000 mg by mouth every 6 (six) hours as needed. pain      Triage Vitals: BP 117/74  Pulse 118  Temp(Src) 101.1 F (38.4 C) (Oral)  Resp 20  Ht 5\' 3"  (1.6 m)  Wt 170 lb (77.111 kg)  BMI 30.11 kg/m2  SpO2 100%  LMP 12/19/2011  Physical Exam  Nursing note and vitals reviewed. Constitutional: She is oriented to person, place, and time. She appears well-developed and well-nourished.       Pt is crying with tears  HENT:  Head: Normocephalic and atraumatic.       Mucus membrane are very moist  Eyes: Conjunctivae and EOM are normal. No scleral icterus.       Sclera are clear, there is no sign of conjunctivitis, eyes track well  Neck: Normal range of motion. Neck supple.  Cardiovascular: Normal rate, regular  rhythm and normal heart sounds.   Pulmonary/Chest: Effort normal and breath sounds normal. No respiratory distress.  Abdominal: Soft. Bowel sounds are normal. There is no tenderness.  Musculoskeletal: Normal range of motion. She exhibits no edema (No lower extremity edema).       Pt moves all extremities   Lymphadenopathy:    She has no cervical adenopathy.  Neurological: She is alert and oriented to person, place, and time. No cranial nerve deficit.  Skin: Skin is warm and dry.  Psychiatric: She has a normal mood and affect. Her behavior is normal.    ED Course  Procedures (including critical care time)  DIAGNOSTIC STUDIES: Oxygen Saturation is 100% on room air, normal by my interpretation.  COORDINATION OF CARE: 3:50PM: Discussed blood work and urinalysis with pt and pt agreed. Advised pt that if she was unable to provide a urine sample, we would have to cath her to get one.    Labs Reviewed  CBC - Abnormal; Notable for the following:    WBC 16.7 (*)    Hemoglobin 9.6 (*)    HCT 32.7 (*)    MCV 73.6 (*)    MCH 21.6 (*)    MCHC 29.4 (*)    Platelets 453 (*)    All other components within normal limits  BASIC METABOLIC PANEL - Abnormal; Notable for the following:    Sodium 134 (*)    All other components within normal limits  URINALYSIS, ROUTINE W REFLEX MICROSCOPIC - Abnormal; Notable for the following:    Ketones, ur TRACE (*)    All other components within normal limits  GLUCOSE, CAPILLARY  PREGNANCY, URINE   No results found.  Results for orders placed during the hospital encounter of 01/08/12  GLUCOSE, CAPILLARY      Component Value Range   Glucose-Capillary 84  70 - 99 (mg/dL)   Comment 1 Documented in Chart     Comment 2 Notify RN    CBC      Component Value Range   WBC 16.7 (*) 4.0 - 10.5 (K/uL)   RBC 4.44  3.87 - 5.11 (MIL/uL)   Hemoglobin 9.6 (*) 12.0 - 15.0 (g/dL)   HCT 19.1 (*) 47.8 - 46.0 (%)   MCV 73.6 (*) 78.0 - 100.0 (fL)   MCH 21.6 (*) 26.0 -  34.0 (pg)   MCHC 29.4 (*) 30.0 - 36.0 (g/dL)   RDW 29.5  62.1 - 30.8 (%)   Platelets 453 (*) 150 - 400 (K/uL)  BASIC METABOLIC PANEL      Component Value Range   Sodium 134 (*) 135 - 145 (mEq/L)   Potassium 4.0  3.5 - 5.1 (mEq/L)   Chloride 100  96 - 112 (mEq/L)   CO2 22  19 - 32 (mEq/L)   Glucose, Bld 89  70 - 99 (mg/dL)   BUN 15  6 - 23 (mg/dL)   Creatinine, Ser 6.57  0.50 - 1.10 (mg/dL)  Calcium 9.6  8.4 - 10.5 (mg/dL)   GFR calc non Af Amer >90  >90 (mL/min)   GFR calc Af Amer >90  >90 (mL/min)  PREGNANCY, URINE      Component Value Range   Preg Test, Ur NEGATIVE  NEGATIVE   URINALYSIS, ROUTINE W REFLEX MICROSCOPIC      Component Value Range   Color, Urine YELLOW  YELLOW    APPearance CLEAR  CLEAR    Specific Gravity, Urine 1.025  1.005 - 1.030    pH 5.5  5.0 - 8.0    Glucose, UA NEGATIVE  NEGATIVE (mg/dL)   Hgb urine dipstick NEGATIVE  NEGATIVE    Bilirubin Urine NEGATIVE  NEGATIVE    Ketones, ur TRACE (*) NEGATIVE (mg/dL)   Protein, ur NEGATIVE  NEGATIVE (mg/dL)   Urobilinogen, UA 0.2  0.0 - 1.0 (mg/dL)   Nitrite NEGATIVE  NEGATIVE    Leukocytes, UA NEGATIVE  NEGATIVE     1. Fever       MDM  Suspect a viral illness onset just today currently nontoxic no acute distress. Lab workup without significant findings other than a leukocytosis. No evidence urinary tract infection. Patient not having cough or congestion consistent with pneumonia suggest x-ray not done. Patient feels better in the emergency department currently.      I personally performed the services described in this documentation, which was scribed in my presence. The recorded information has been reviewed and considered.  And I   Bethany Jakes, MD 01/08/12 [phone]

## 2012-01-16 ENCOUNTER — Other Ambulatory Visit (HOSPITAL_COMMUNITY): Payer: Self-pay | Admitting: Orthopaedic Surgery

## 2012-01-16 ENCOUNTER — Ambulatory Visit (HOSPITAL_COMMUNITY)
Admission: RE | Admit: 2012-01-16 | Discharge: 2012-01-16 | Disposition: A | Discharge status: Home / Self Care | Payer: Self-pay | Source: Ambulatory Visit | Attending: Orthopaedic Surgery | Admitting: Orthopaedic Surgery

## 2012-01-16 DIAGNOSIS — X58XXXA Exposure to other specified factors, initial encounter: Secondary | ICD-10-CM | POA: Insufficient documentation

## 2012-01-16 DIAGNOSIS — T148XXA Other injury of unspecified body region, initial encounter: Secondary | ICD-10-CM | POA: Insufficient documentation

## 2013-02-15 ENCOUNTER — Emergency Department (HOSPITAL_COMMUNITY)
Admission: EM | Admit: 2013-02-15 | Discharge: 2013-02-15 | Disposition: A | Discharge status: Home / Self Care | Payer: Self-pay | Attending: Emergency Medicine | Admitting: Emergency Medicine

## 2013-02-15 ENCOUNTER — Encounter (HOSPITAL_COMMUNITY): Payer: Self-pay | Admitting: *Deleted

## 2013-02-15 DIAGNOSIS — R5381 Other malaise: Secondary | ICD-10-CM | POA: Insufficient documentation

## 2013-02-15 DIAGNOSIS — R05 Cough: Secondary | ICD-10-CM | POA: Insufficient documentation

## 2013-02-15 DIAGNOSIS — R059 Cough, unspecified: Secondary | ICD-10-CM | POA: Insufficient documentation

## 2013-02-15 DIAGNOSIS — Z3202 Encounter for pregnancy test, result negative: Secondary | ICD-10-CM | POA: Insufficient documentation

## 2013-02-15 DIAGNOSIS — Z91040 Latex allergy status: Secondary | ICD-10-CM | POA: Insufficient documentation

## 2013-02-15 DIAGNOSIS — E860 Dehydration: Secondary | ICD-10-CM | POA: Insufficient documentation

## 2013-02-15 DIAGNOSIS — R5383 Other fatigue: Secondary | ICD-10-CM | POA: Insufficient documentation

## 2013-02-15 DIAGNOSIS — R51 Headache: Secondary | ICD-10-CM | POA: Insufficient documentation

## 2013-02-15 DIAGNOSIS — R42 Dizziness and giddiness: Secondary | ICD-10-CM | POA: Insufficient documentation

## 2013-02-15 LAB — BASIC METABOLIC PANEL
BUN: 13 mg/dL (ref 6–23)
Chloride: 102 mEq/L (ref 96–112)
Creatinine, Ser: 0.71 mg/dL (ref 0.50–1.10)
Glucose, Bld: 89 mg/dL (ref 70–99)
Potassium: 4.3 mEq/L (ref 3.5–5.1)

## 2013-02-15 LAB — CBC WITH DIFFERENTIAL/PLATELET
HCT: 35.1 % — ABNORMAL LOW (ref 36.0–46.0)
Hemoglobin: 10.7 g/dL — ABNORMAL LOW (ref 12.0–15.0)
Lymphs Abs: 3 10*3/uL (ref 0.7–4.0)
MCH: 23.5 pg — ABNORMAL LOW (ref 26.0–34.0)
Monocytes Absolute: 0.7 10*3/uL (ref 0.1–1.0)
Monocytes Relative: 9 % (ref 3–12)
Neutro Abs: 3.9 10*3/uL (ref 1.7–7.7)
Neutrophils Relative %: 50 % (ref 43–77)
RBC: 4.56 MIL/uL (ref 3.87–5.11)

## 2013-02-15 LAB — URINALYSIS, ROUTINE W REFLEX MICROSCOPIC
Bilirubin Urine: NEGATIVE
Glucose, UA: NEGATIVE mg/dL
Hgb urine dipstick: NEGATIVE
Specific Gravity, Urine: >1.03 — ABNORMAL HIGH (ref 1.005–1.030)
pH: 6 (ref 5.0–8.0)

## 2013-02-15 MED ORDER — MECLIZINE HCL 12.5 MG PO TABS
25.0000 mg | ORAL_TABLET | Freq: Once | ORAL | Status: AC
  Administered 2013-02-15: 25 mg via ORAL
  Filled 2013-02-15: qty 2

## 2013-02-15 NOTE — ED Notes (Signed)
Patient would like something to drink at this time. RN made aware. 

## 2013-02-15 NOTE — ED Provider Notes (Signed)
History  This chart was scribed for Benny Lennert, MD by Ardelia Mems, ED Scribe. This patient was seen in room APA05/APA05 and the patient's care was started at 7:55 AM.   CSN: [id number]  Arrival date & time 02/15/13  0731     Chief Complaint  Patient presents with  . Near Syncope     Patient is a 22 y.o. female presenting with weakness. The history is provided by the patient. No language interpreter was used.  Weakness This is a recurrent problem. The current episode started 1 to 2 hours ago. The problem occurs rarely. The problem has been gradually improving. Pertinent negatives include no chest pain, no abdominal pain and no headaches. The symptoms are aggravated by standing (closing eyes). Nothing relieves the symptoms. She has tried nothing for the symptoms.   HPI Comments: Bethany Gomez is a 22 y.o. female who presents to the Emergency Department complaining of intermittent, moderate dizziness. Pt states that there is associated moderate, intermittent HA and moderate, constant cough.of 1 week duration. Pt states that she woke up this morning and that she was dizzy and unable to walk. She reports having the feeling that the room was spinning. She states that dizziness is worsened when closing eyes and standing. Pt states that she has a h/o similar symptoms. Pt is not in acute distress. Pt denies fever, chills, nausea, diarrhea or any other symptoms. Pt denies smoking and alcohol use.   Past Medical History  Diagnosis Date  . 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 Headaches, cluster     History reviewed. No pertinent past surgical history.  No family history on file.  History  Substance Use Topics  . Smoking status: Never Smoker   . Smokeless tobacco: Not on file  . Alcohol Use: No    OB History   Grav Para Term Preterm Abortions TAB SAB Ect Mult Living                  Review of Systems  Constitutional: Negative for appetite change and fatigue.  HENT: Negative for congestion,  sinus pressure and ear discharge.   Eyes: Negative for discharge.  Respiratory: Negative for cough.   Cardiovascular: Negative for chest pain.  Gastrointestinal: Negative for abdominal pain and diarrhea.  Genitourinary: Negative for frequency and hematuria.  Musculoskeletal: Negative for back pain.  Skin: Negative for rash.  Neurological: Positive for weakness. Negative for seizures and headaches.  Psychiatric/Behavioral: Negative for hallucinations.    Allergies  Latex  Home Medications   Current Outpatient Rx  Name  Route  Sig  Dispense  Refill  . acetaminophen (TYLENOL) 500 MG tablet   Oral   Take 1,000 mg by mouth every 6 (six) hours as needed. pain           Triage Vitals: BP 131/86  Pulse 87  Temp(Src) 98.9 F (37.2 C) (Oral)  Resp 16  SpO2 99%  LMP 01/28/2013  Physical Exam  Constitutional: She is oriented to person, place, and time. She appears well-developed.  HENT:  Head: Normocephalic.  Eyes: Conjunctivae and EOM are normal. No scleral icterus.  Neck: Neck supple. No thyromegaly present.  Cardiovascular: Normal rate and regular rhythm.  Exam reveals no gallop and no friction rub.   No murmur heard. Pulmonary/Chest: No stridor. She has no wheezes. She has no rales. She exhibits no tenderness.  Abdominal: She exhibits no distension. There is no tenderness. There is no rebound.  Musculoskeletal: Normal range of motion. She exhibits no  edema.  Lymphadenopathy:    She has no cervical adenopathy.  Neurological: She is oriented to person, place, and time. Coordination normal.  Skin: No rash noted. No erythema.  Psychiatric: She has a normal mood and affect. Her behavior is normal.    ED Course  Procedures (including critical care time)  DIAGNOSTIC STUDIES: Oxygen Saturation is 99% on RA, normal by my interpretation.    COORDINATION OF CARE: 8:02 AM- Pt advised of plan for treatment and pt agrees.  Medications  meclizine (ANTIVERT) tablet 25 mg (25 mg  Oral Given 02/15/13 0825)      Labs Reviewed  CBC WITH DIFFERENTIAL - Abnormal; Notable for the following:    Hemoglobin 10.7 (*)    HCT 35.1 (*)    MCV 77.0 (*)    MCH 23.5 (*)    RDW 16.1 (*)    Platelets 470 (*)    All other components within normal limits  URINALYSIS, ROUTINE W REFLEX MICROSCOPIC - Abnormal; Notable for the following:    Specific Gravity, Urine >1.030 (*)    Ketones, ur TRACE (*)    All other components within normal limits  BASIC METABOLIC PANEL  PREGNANCY, URINE   No results found.   No diagnosis found.    MDM   Dizziness related to dehydration and allergies       The chart was scribed for me under my direct supervision.  I personally performed the history, physical, and medical decision making and all procedures in the evaluation of this patient.Benny Lennert, MD 02/15/13 1024

## 2013-02-15 NOTE — ED Notes (Signed)
Patient given water per RN approval. 

## 2013-02-15 NOTE — ED Notes (Signed)
Dizziness. Worse when closing eyes and standing. States room is spinning. Cough all week. NAD.

## 2013-06-12 ENCOUNTER — Encounter (HOSPITAL_COMMUNITY): Payer: Self-pay | Admitting: *Deleted

## 2013-06-12 ENCOUNTER — Emergency Department (HOSPITAL_COMMUNITY)
Admission: EM | Admit: 2013-06-12 | Discharge: 2013-06-12 | Disposition: A | Discharge status: Home / Self Care | Payer: Self-pay | Attending: Emergency Medicine | Admitting: Emergency Medicine

## 2013-06-12 DIAGNOSIS — R52 Pain, unspecified: Secondary | ICD-10-CM | POA: Insufficient documentation

## 2013-06-12 DIAGNOSIS — IMO0002 Reserved for concepts with insufficient information to code with codable children: Secondary | ICD-10-CM | POA: Insufficient documentation

## 2013-06-12 DIAGNOSIS — Z91040 Latex allergy status: Secondary | ICD-10-CM | POA: Insufficient documentation

## 2013-06-12 DIAGNOSIS — R51 Headache: Secondary | ICD-10-CM | POA: Insufficient documentation

## 2013-06-12 DIAGNOSIS — R059 Cough, unspecified: Secondary | ICD-10-CM | POA: Insufficient documentation

## 2013-06-12 DIAGNOSIS — R000 Tachycardia, unspecified: Secondary | ICD-10-CM | POA: Insufficient documentation

## 2013-06-12 DIAGNOSIS — R05 Cough: Secondary | ICD-10-CM | POA: Insufficient documentation

## 2013-06-12 DIAGNOSIS — R1110 Vomiting, unspecified: Secondary | ICD-10-CM | POA: Insufficient documentation

## 2013-06-12 DIAGNOSIS — B9789 Other viral agents as the cause of diseases classified elsewhere: Secondary | ICD-10-CM | POA: Insufficient documentation

## 2013-06-12 DIAGNOSIS — Z8669 Personal history of other diseases of the nervous system and sense organs: Secondary | ICD-10-CM | POA: Insufficient documentation

## 2013-06-12 DIAGNOSIS — B349 Viral infection, unspecified: Secondary | ICD-10-CM

## 2013-06-12 DIAGNOSIS — J029 Acute pharyngitis, unspecified: Secondary | ICD-10-CM | POA: Insufficient documentation

## 2013-06-12 DIAGNOSIS — J3489 Other specified disorders of nose and nasal sinuses: Secondary | ICD-10-CM | POA: Insufficient documentation

## 2013-06-12 DIAGNOSIS — Z3202 Encounter for pregnancy test, result negative: Secondary | ICD-10-CM | POA: Insufficient documentation

## 2013-06-12 DIAGNOSIS — F17200 Nicotine dependence, unspecified, uncomplicated: Secondary | ICD-10-CM | POA: Insufficient documentation

## 2013-06-12 LAB — URINALYSIS, ROUTINE W REFLEX MICROSCOPIC
Glucose, UA: NEGATIVE mg/dL
Leukocytes, UA: NEGATIVE
Protein, ur: NEGATIVE mg/dL
Specific Gravity, Urine: 1.025 (ref 1.005–1.030)
pH: 6 (ref 5.0–8.0)

## 2013-06-12 LAB — PREGNANCY, URINE: Preg Test, Ur: NEGATIVE

## 2013-06-12 MED ORDER — ACETAMINOPHEN 500 MG PO TABS
1000.0000 mg | ORAL_TABLET | Freq: Once | ORAL | Status: AC
  Administered 2013-06-12: 1000 mg via ORAL
  Filled 2013-06-12: qty 2

## 2013-06-12 NOTE — ED Provider Notes (Signed)
CSN: [id number]     Arrival date & time 06/12/13  2048 History   This chart was scribed for Bethany Camel, MD by Blanchard Kelch, ED Scribe. The patient was seen in room APA08/APA08. Patient's care was started at 9:52 PM.    Chief Complaint  Patient presents with  . Fever  . Generalized Body Aches   Patient is a 22 y.o. female presenting with fever. The history is provided by the patient. No language interpreter was used.  Fever Associated symptoms: cough, headaches, myalgias, rhinorrhea, sore throat and vomiting   Associated symptoms: no congestion, no diarrhea and no dysuria     HPI Comments: Bethany Gomez is a 22 y.o. female who presents to the Emergency Department complaining of sudden-onset, constant headaches with associated myalgia and fevers (max 101 recorded in ED) that began yesterday. Her pain is currently an 8/10. She states the pain is worst in her back and describes it as aching. She reports one episode of emesis that occurred yesterday. She complains of dry cough, rhinorrhea, sore throat, and feeling dehydrated. She denies abdominal pain, congestion, diarrhea and dysuria. She has not had any sick contacts.    Past Medical History  Diagnosis Date  . 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 Headaches, cluster    History reviewed. No pertinent past surgical history. History reviewed. No pertinent family history. History  Substance Use Topics  . Smoking status: Light Tobacco Smoker  . Smokeless tobacco: Not on file  . Alcohol Use: No   OB History   Grav Para Term Preterm Abortions TAB SAB Ect Mult Living                 Review of Systems  Constitutional: Positive for fever.  HENT: Positive for sore throat and rhinorrhea. Negative for congestion.   Respiratory: Positive for cough.   Gastrointestinal: Positive for vomiting. Negative for abdominal pain and diarrhea.  Genitourinary: Negative for dysuria.  Musculoskeletal: Positive for myalgias.  Neurological: Positive for headaches.  All  other systems reviewed and are negative.    Allergies  Latex  Home Medications  No current outpatient prescriptions on file. Triage Vitals: BP 135/83  Pulse 107  Temp(Src) 101.1 F (38.4 C) (Oral)  Resp 24  Ht 5\' 3"  (1.6 m)  Wt 144 lb (65.318 kg)  BMI 25.51 kg/m2  SpO2 100%  LMP 05/21/2013  Physical Exam  Nursing note and vitals reviewed. Constitutional: She is oriented to person, place, and time. She appears well-developed and well-nourished. No distress.  HENT:  Head: Normocephalic and atraumatic.  Mouth/Throat: Oropharynx is clear and moist. No oropharyngeal exudate.  Mild erythema of oropharynx.  Eyes: Conjunctivae and EOM are normal.  Neck: Neck supple. No tracheal deviation present.  Cardiovascular: Regular rhythm and normal heart sounds.  Tachycardia present.   Pulmonary/Chest: Effort normal and breath sounds normal. No respiratory distress. She has no wheezes. She has no rales.  Abdominal: Soft. She exhibits no distension. There is no tenderness.  No CVA tenderness.  Musculoskeletal: Normal range of motion.  Lymphadenopathy:    She has no cervical adenopathy.  Neurological: She is alert and oriented to person, place, and time.  Skin: Skin is warm and dry.  Psychiatric: She has a normal mood and affect. Her behavior is normal.    ED Course  Procedures (including critical care time)  DIAGNOSTIC STUDIES: Oxygen Saturation is 100% on room air, normal by my interpretation.    COORDINATION OF CARE:  9:56 PM - Patient verbalizes understanding  and agrees with treatment plan.   Labs Review Labs Reviewed  URINALYSIS, ROUTINE W REFLEX MICROSCOPIC - Abnormal; Notable for the following:    Ketones, ur 15 (*)    All other components within normal limits  PREGNANCY, URINE   Imaging Review No results found.  MDM   1. Viral syndrome    Her symptoms are consistent with a viral syndrome. Her tachycardia resolved with Tylenol and oral rehydration. I offered  patient IV fluids even though she does not appear overtly dehydrated and she would rather do oral rehydration. She otherwise appears well hydrated and has a normal neuro exam and will appearance. I feel the likelihood of bacterial infection including meningitis, pneumonia, and UTI are all very unlikely. There is no testing indicated for strep throat by [facility] criteria (gets 1 point for fever). Will discharge home with good return precautions and PCP followup.  I personally performed the services described in this documentation, which was scribed in my presence. The recorded information has been reviewed and is accurate.    Bethany Camel, MD 06/12/13 [phone]

## 2013-06-12 NOTE — ED Notes (Signed)
Pt reporting generalized headache and body aches and states she thinks she may be dehydrated.

## 2014-02-10 ENCOUNTER — Emergency Department (HOSPITAL_COMMUNITY): Payer: Self-pay

## 2014-02-10 ENCOUNTER — Emergency Department (HOSPITAL_COMMUNITY)
Admission: EM | Admit: 2014-02-10 | Discharge: 2014-02-10 | Disposition: A | Discharge status: Home / Self Care | Payer: Self-pay | Attending: Emergency Medicine | Admitting: Emergency Medicine

## 2014-02-10 ENCOUNTER — Encounter (HOSPITAL_COMMUNITY): Payer: Self-pay | Admitting: Emergency Medicine

## 2014-02-10 DIAGNOSIS — Z3202 Encounter for pregnancy test, result negative: Secondary | ICD-10-CM | POA: Insufficient documentation

## 2014-02-10 DIAGNOSIS — F17200 Nicotine dependence, unspecified, uncomplicated: Secondary | ICD-10-CM | POA: Insufficient documentation

## 2014-02-10 DIAGNOSIS — R51 Headache: Secondary | ICD-10-CM | POA: Insufficient documentation

## 2014-02-10 DIAGNOSIS — B9789 Other viral agents as the cause of diseases classified elsewhere: Secondary | ICD-10-CM | POA: Insufficient documentation

## 2014-02-10 DIAGNOSIS — R1031 Right lower quadrant pain: Secondary | ICD-10-CM | POA: Insufficient documentation

## 2014-02-10 DIAGNOSIS — B349 Viral infection, unspecified: Secondary | ICD-10-CM

## 2014-02-10 DIAGNOSIS — Z91040 Latex allergy status: Secondary | ICD-10-CM | POA: Insufficient documentation

## 2014-02-10 LAB — URINALYSIS, ROUTINE W REFLEX MICROSCOPIC
Bilirubin Urine: NEGATIVE
GLUCOSE, UA: NEGATIVE mg/dL
HGB URINE DIPSTICK: NEGATIVE
KETONES UR: NEGATIVE mg/dL
LEUKOCYTES UA: NEGATIVE
Nitrite: NEGATIVE
PH: 8 (ref 5.0–8.0)
PROTEIN: NEGATIVE mg/dL
Specific Gravity, Urine: 1.02 (ref 1.005–1.030)
Urobilinogen, UA: 0.2 mg/dL (ref 0.0–1.0)

## 2014-02-10 LAB — POC URINE PREG, ED: PREG TEST UR: NEGATIVE

## 2014-02-10 MED ORDER — ONDANSETRON HCL 4 MG/2ML IJ SOLN
4.0000 mg | Freq: Once | INTRAMUSCULAR | Status: AC
  Administered 2014-02-10: 4 mg via INTRAVENOUS
  Filled 2014-02-10: qty 2

## 2014-02-10 MED ORDER — KETOROLAC TROMETHAMINE 30 MG/ML IJ SOLN
30.0000 mg | Freq: Once | INTRAMUSCULAR | Status: AC
  Administered 2014-02-10: 30 mg via INTRAVENOUS
  Filled 2014-02-10: qty 1

## 2014-02-10 MED ORDER — SODIUM CHLORIDE 0.9 % IV BOLUS (SEPSIS)
1000.0000 mL | Freq: Once | INTRAVENOUS | Status: AC
  Administered 2014-02-10: 1000 mL via INTRAVENOUS

## 2014-02-10 MED ORDER — IBUPROFEN 800 MG PO TABS
ORAL_TABLET | ORAL | Status: AC
  Filled 2014-02-10: qty 1

## 2014-02-10 MED ORDER — IBUPROFEN 800 MG PO TABS
800.0000 mg | ORAL_TABLET | Freq: Once | ORAL | Status: AC
  Administered 2014-02-10: 800 mg via ORAL

## 2014-02-10 MED ORDER — SODIUM CHLORIDE 0.9 % IV SOLN
INTRAVENOUS | Status: DC
  Administered 2014-02-10: 21:00:00 via INTRAVENOUS

## 2014-02-10 NOTE — Discharge Instructions (Signed)

## 2014-02-10 NOTE — ED Provider Notes (Signed)
CSN: [id number]     Arrival date & time 02/10/14  1838 History   First MD Initiated Contact with Patient 02/10/14 1911 This chart was scribed for Toy BakerAnthony T Noemi Ishmael, MD by Valera CastleSteven Perry, ED Scribe. This patient was seen in room APA06/APA06 and the patient's care was started at 7:16 PM.     Chief Complaint  Patient presents with  . Fever   (Consider location/radiation/quality/duration/timing/severity/associated sxs/prior Treatment) The history is provided by the patient. No language interpreter was used.   HPI Comments: Bethany Gomez is a 23 y.o. female who presents to the Emergency Department complaining of constant, generalized body aches, onset today, with associated headache to the back of her head, unproductive cough, and subjective fever. She also reports 1 episode of vomiting today and constant, diffuse abdominal pain. She denies medication PTA. Pt reports feeling fine prior to today. She denies being around any known sick contacts. She reports h/o dehydration and headaches. She states her current symptoms feels like her h/o dehydration. Mother reports pt has had a stressful day today. Pt denies diarrhea, dysuria, rash, and any other associated symptoms. She denies h/o bladder infections. 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 She reports possibility of pregnancy. She denies any current vaginal bleeding or discharge.  PCP - Colette RibasGOLDING, JOHN CABOT, MD  Past Medical History  Diagnosis Date  . 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 Headaches, cluster    History reviewed. No pertinent past surgical history. History reviewed. No pertinent family history. History  Substance Use Topics  . Smoking status: Light Tobacco Smoker  . Smokeless tobacco: Not on file  . Alcohol Use: No   OB History   Grav Para Term Preterm Abortions TAB SAB Ect Mult Living                 Review of Systems  Constitutional: Positive for fever.       Positive for dehydration  HENT: Negative for sore throat and trouble swallowing.    Respiratory: Positive for cough.   Gastrointestinal: Positive for vomiting (1 episode) and abdominal pain (RLQ). Negative for diarrhea.  Genitourinary: Negative for dysuria.  Musculoskeletal: Positive for myalgias (body aches).  Skin: Negative for rash.  Neurological: Positive for headaches (at back of head).    Allergies  Latex  Home Medications   Prior to Admission medications   Not on File   BP 138/83  Pulse 114  Temp(Src) 101.8 F (38.8 C) (Oral)  Resp 24  Ht 5\' 2"  (1.575 m)  Wt 152 lb 8 oz (69.174 kg)  BMI 27.89 kg/m2  SpO2 100%  LMP 01/09/2014  Physical Exam  Nursing note and vitals reviewed. Constitutional: She is oriented to person, place, and time. She appears well-developed and well-nourished.  Non-toxic appearance. No distress.  HENT:  Head: Normocephalic and atraumatic.  Right Ear: Hearing, tympanic membrane, external ear and ear canal normal.  Left Ear: Hearing, tympanic membrane, external ear and ear canal normal.  Nose: Nose normal.  Mouth/Throat: Uvula is midline, oropharynx is clear and moist and mucous membranes are normal. No oropharyngeal exudate.  Eyes: Conjunctivae, EOM and lids are normal. Pupils are equal, round, and reactive to light.  Neck: Trachea normal and normal range of motion. Neck supple. No tracheal deviation present. No mass and no thyromegaly present.  No menigismal signs.  Cardiovascular: Normal rate, regular rhythm and normal heart sounds.   No murmur heard. Pulmonary/Chest: Effort normal and breath sounds normal. No stridor. No respiratory distress. She has no decreased  breath sounds. She has no wheezes. She has no rhonchi. She has no rales.  Abdominal: Soft. Normal appearance and bowel sounds are normal. She exhibits no distension. There is no tenderness. There is no rebound and no CVA tenderness.  Musculoskeletal: Normal range of motion. She exhibits no edema and no tenderness.  Lymphadenopathy:    She has no cervical  adenopathy.  Neurological: She is alert and oriented to person, place, and time. She has normal strength. No cranial nerve deficit or sensory deficit. GCS eye subscore is 4. GCS verbal subscore is 5. GCS motor subscore is 6.  Skin: Skin is warm and dry. No abrasion and no rash noted.  No rashes appreciated.   Psychiatric: She has a normal mood and affect. Her speech is normal and behavior is normal.    ED Course  Procedures (including critical care time)  DIAGNOSTIC STUDIES: Oxygen Saturation is 100% on room air, normal by my interpretation.    COORDINATION OF CARE: 7:21 PM-Discussed treatment plan which includes IV fluids, CXR, and UA with pt at bedside and pt agreed to plan.   Results for orders placed during the hospital encounter of 02/10/14  URINALYSIS, ROUTINE W REFLEX MICROSCOPIC      Result Value Ref Range   Color, Urine YELLOW  YELLOW   APPearance CLEAR  CLEAR   Specific Gravity, Urine 1.020  1.005 - 1.030   pH 8.0  5.0 - 8.0   Glucose, UA NEGATIVE  NEGATIVE mg/dL   Hgb urine dipstick NEGATIVE  NEGATIVE   Bilirubin Urine NEGATIVE  NEGATIVE   Ketones, ur NEGATIVE  NEGATIVE mg/dL   Protein, ur NEGATIVE  NEGATIVE mg/dL   Urobilinogen, UA 0.2  0.0 - 1.0 mg/dL   Nitrite NEGATIVE  NEGATIVE   Leukocytes, UA NEGATIVE  NEGATIVE  POC URINE PREG, ED      Result Value Ref Range   Preg Test, Ur NEGATIVE  NEGATIVE   No results found.   EKG Interpretation None     Medications  0.9 %  sodium chloride infusion (not administered)  ibuprofen (ADVIL,MOTRIN) tablet 800 mg (800 mg Oral Given 02/10/14 1853)  sodium chloride 0.9 % bolus 1,000 mL (1,000 mLs Intravenous New Bag/Given 02/10/14 1959)  ondansetron (ZOFRAN) injection 4 mg (4 mg Intravenous Given 02/10/14 1959)   MDM   Final diagnoses:  None   Pt given meds and fluids--feels better--no evidence of meningitis--suspect viral illness, stable for d/c    Toy BakerAnthony T Heena Woodbury, MD 02/10/14 2144

## 2014-02-10 NOTE — ED Notes (Addendum)
Pt vomited a whole entire emesis basin full of emesis of partially digested food, pt states that she did recently ate

## 2014-02-10 NOTE — ED Notes (Addendum)
Cough, 2-3 weeks, throat feels tight.  Says she feels she is dehydrated.  Body aches. fever

## 2014-02-12 LAB — URINE CULTURE: Colony Count: >=100000

## 2014-04-07 ENCOUNTER — Emergency Department (HOSPITAL_COMMUNITY)
Admission: EM | Admit: 2014-04-07 | Discharge: 2014-04-07 | Disposition: A | Discharge status: Home / Self Care | Payer: Self-pay | Attending: Emergency Medicine | Admitting: Emergency Medicine

## 2014-04-07 ENCOUNTER — Encounter (HOSPITAL_COMMUNITY): Payer: Self-pay | Admitting: Emergency Medicine

## 2014-04-07 DIAGNOSIS — M25572 Pain in left ankle and joints of left foot: Secondary | ICD-10-CM

## 2014-04-07 DIAGNOSIS — Z91040 Latex allergy status: Secondary | ICD-10-CM | POA: Insufficient documentation

## 2014-04-07 DIAGNOSIS — M25579 Pain in unspecified ankle and joints of unspecified foot: Secondary | ICD-10-CM | POA: Insufficient documentation

## 2014-04-07 DIAGNOSIS — Z8781 Personal history of (healed) traumatic fracture: Secondary | ICD-10-CM | POA: Insufficient documentation

## 2014-04-07 DIAGNOSIS — Z8669 Personal history of other diseases of the nervous system and sense organs: Secondary | ICD-10-CM | POA: Insufficient documentation

## 2014-04-07 DIAGNOSIS — Z791 Long term (current) use of non-steroidal anti-inflammatories (NSAID): Secondary | ICD-10-CM | POA: Insufficient documentation

## 2014-04-07 DIAGNOSIS — F17200 Nicotine dependence, unspecified, uncomplicated: Secondary | ICD-10-CM | POA: Insufficient documentation

## 2014-04-07 MED ORDER — NAPROXEN 250 MG PO TABS
500.0000 mg | ORAL_TABLET | Freq: Once | ORAL | Status: AC
  Administered 2014-04-07: 500 mg via ORAL
  Filled 2014-04-07: qty 2

## 2014-04-07 MED ORDER — NAPROXEN 500 MG PO TABS: 500.0000 mg | ORAL_TABLET | Freq: Two times a day (BID) | ORAL | Status: DC

## 2014-04-07 NOTE — ED Provider Notes (Signed)
CSN: [id number]     Arrival date & time 04/07/14  0459 History   First MD Initiated Contact with Patient 04/07/14 [phone]     Chief Complaint  Patient presents with  . Ankle Pain     (Consider location/radiation/quality/duration/timing/severity/associated sxs/prior Treatment) HPI Comments: The patient reports a prior history of ankle fracture several years ago on the left, nonsurgical, healed completely. This evening while standing she felt pain located in the lateral foot below the ankle, some radiation up the leg, worse with standing, resolves with sitting or raising her leg. Initially there was some signs of a mass or a lump on the lateral side of the foot but this resolved spontaneously. No interventions or medications taken prior to arrival. She denies injury, she is ambulatory with minimal difficulty  Patient is a 23 y.o. female presenting with ankle pain. The history is provided by the patient.  Ankle Pain   Past Medical History  Diagnosis Date  . 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 Headaches, cluster    History reviewed. No pertinent past surgical history. No family history on file. History  Substance Use Topics  . Smoking status: Light Tobacco Smoker  . Smokeless tobacco: Not on file  . Alcohol Use: No   OB History   Grav Para Term Preterm Abortions TAB SAB Ect Mult Living                 Review of Systems  Musculoskeletal: Positive for joint swelling.  Skin: Negative for rash.      Allergies  Latex  Home Medications   Prior to Admission medications   Medication Sig Start Date End Date Taking? Authorizing Provider  citalopram (CELEXA) 20 MG tablet Take 20 mg by mouth every morning.    Historical Provider, MD  naproxen (NAPROSYN) 500 MG tablet Take 1 tablet (500 mg total) by mouth 2 (two) times daily with a meal. 04/07/14   Vida RollerBrian D Adylee Leonardo, MD  traZODone (DESYREL) 50 MG tablet Take 50 mg by mouth at bedtime.    Historical Provider, MD   Pulse 77  Temp(Src) 98.1 F (36.7 C)  (Oral)  Resp 20  Ht 5' 2.5" (1.588 m)  Wt 150 lb (68.04 kg)  BMI 26.98 kg/m2  SpO2 98% Physical Exam  Nursing note and vitals reviewed. Constitutional: She appears well-developed and well-nourished. No distress.  HENT:  Head: Normocephalic and atraumatic.  Eyes: Conjunctivae are normal. No scleral icterus.  Cardiovascular: Normal rate, regular rhythm and intact distal pulses.   Pulmonary/Chest: Effort normal and breath sounds normal.  Musculoskeletal: She exhibits tenderness ( Minimal tenderness to the lateral foot just inferior to the lateral malleolus, no swelling bruising or masses). She exhibits no edema.  No tenderness at the head of the fibula or the distal lateral or medial malleoli, normal range of motion of the foot and ankle with minimal tenderness with inversion of the left ankle  Neurological: She is alert.  Skin: Skin is warm and dry. No rash noted. She is not diaphoretic.    ED Course  Procedures (including critical care time) Labs Review Labs Reviewed - No data to display  Imaging Review No results found.    MDM   Final diagnoses:  Ankle pain, left    Patient is well-appearing, nontraumatic minimal ankle pain, no swelling, no signs of fracture, will assist with some immobilization with ASO ankle splint, anti-inflammatories and rice therapy.   Meds given in ED:  Medications  naproxen (NAPROSYN) tablet 500 mg (not administered)  New Prescriptions   NAPROXEN (NAPROSYN) 500 MG TABLET    Take 1 tablet (500 mg total) by mouth 2 (two) times daily with a meal.       Vida Roller, MD 04/07/14 [phone]

## 2014-04-07 NOTE — ED Notes (Signed)
Pt states she was working [employer] and her left ankle starting hurting. Pt denies new injury, states she broke it a couple of years ago.

## 2014-04-24 ENCOUNTER — Encounter (HOSPITAL_COMMUNITY): Payer: Self-pay | Admitting: Emergency Medicine

## 2014-04-24 ENCOUNTER — Emergency Department (HOSPITAL_COMMUNITY): Payer: Self-pay

## 2014-04-24 ENCOUNTER — Emergency Department (HOSPITAL_COMMUNITY)
Admission: EM | Admit: 2014-04-24 | Discharge: 2014-04-24 | Disposition: A | Discharge status: Home / Self Care | Payer: Self-pay | Attending: Emergency Medicine | Admitting: Emergency Medicine

## 2014-04-24 DIAGNOSIS — Z791 Long term (current) use of non-steroidal anti-inflammatories (NSAID): Secondary | ICD-10-CM | POA: Insufficient documentation

## 2014-04-24 DIAGNOSIS — F17200 Nicotine dependence, unspecified, uncomplicated: Secondary | ICD-10-CM | POA: Insufficient documentation

## 2014-04-24 DIAGNOSIS — M25569 Pain in unspecified knee: Secondary | ICD-10-CM | POA: Insufficient documentation

## 2014-04-24 DIAGNOSIS — M25572 Pain in left ankle and joints of left foot: Secondary | ICD-10-CM

## 2014-04-24 DIAGNOSIS — Z91040 Latex allergy status: Secondary | ICD-10-CM | POA: Insufficient documentation

## 2014-04-24 DIAGNOSIS — Z8669 Personal history of other diseases of the nervous system and sense organs: Secondary | ICD-10-CM | POA: Insufficient documentation

## 2014-04-24 MED ORDER — ACETAMINOPHEN-CODEINE #3 300-30 MG PO TABS: ORAL_TABLET | ORAL | Status: DC

## 2014-04-24 NOTE — Discharge Instructions (Signed)
Please continue your Naprosyn as ordered. Lesions Tylenol codeine at bedtime if needed for pain, or every 6 hours if needed for pain. Tylenol codeine may cause drowsiness, please use with caution. It is very important that you see the orthopedic specialist listed above, or the podiatry specialist in this area for assistance with your ongoing pain and management. Your x-ray today is negative for any new fracture, or dislocation or free-floating body. Please elevate her foot when possible. Ankle Pain Ankle pain is a common symptom. The bones, cartilage, tendons, and muscles of the ankle joint perform a lot of work each day. The ankle joint holds your body weight and allows you to move around. Ankle pain can occur on either side or back of 1 or both ankles. Ankle pain may be sharp and burning or dull and aching. There may be tenderness, stiffness, redness, or warmth around the ankle. The pain occurs more often when a person walks or puts pressure on the ankle. CAUSES  There are many reasons ankle pain can develop. It is important to work with your caregiver to identify the cause since many conditions can impact the bones, cartilage, muscles, and tendons. Causes for ankle pain include:  Injury, including a break (fracture), sprain, or strain often due to a fall, sports, or a high-impact activity.  Swelling (inflammation) of a tendon (tendonitis).  Achilles tendon rupture.  Ankle instability after repeated sprains and strains.  Poor foot alignment.  Pressure on a nerve (tarsal tunnel syndrome).  Arthritis in the ankle or the lining of the ankle.  Crystal formation in the ankle (gout or pseudogout). DIAGNOSIS  A diagnosis is based on your medical history, your symptoms, results of your physical exam, and results of diagnostic tests. Diagnostic tests may include X-ray exams or a computerized magnetic scan (magnetic resonance imaging, MRI). TREATMENT  Treatment will depend on the cause of your ankle  pain and may include:  Keeping pressure off the ankle and limiting activities.  Using crutches or other walking support (a cane or brace).  Using rest, ice, compression, and elevation.  Participating in physical therapy or home exercises.  Wearing shoe inserts or special shoes.  Losing weight.  Taking medications to reduce pain or swelling or receiving an injection.  Undergoing surgery. HOME CARE INSTRUCTIONS   Only take over-the-counter or prescription medicines for pain, discomfort, or fever as directed by your caregiver.  Put ice on the injured area.  Put ice in a plastic bag.  Place a towel between your skin and the bag.  Leave the ice on for 15-20 minutes at a time, 03-04 times a day.  Keep your leg raised (elevated) when possible to lessen swelling.  Avoid activities that cause ankle pain.  Follow specific exercises as directed by your caregiver.  Record how often you have ankle pain, the location of the pain, and what it feels like. This information may be helpful to you and your caregiver.  Ask your caregiver about returning to work or sports and whether you should drive.  Follow up with your caregiver for further examination, therapy, or testing as directed. SEEK MEDICAL CARE IF:   Pain or swelling continues or worsens beyond 1 week.  You have an oral temperature above 102 F (38.9 C).  You are feeling unwell or have chills.  You are having an increasingly difficult time with walking.  You have loss of sensation or other new symptoms.  You have questions or concerns. MAKE SURE YOU:   Understand these  instructions.  Will watch your condition.  Will get help right away if you are not doing well or get worse. Document Released: 03/02/2010 Document Revised: 12/05/2011 Document Reviewed: 03/02/2010 [facility] Patient Information 2015 [place], [place]. This information is not intended to replace advice given to you by your health care provider. Make sure  you discuss any questions you have with your health care provider.

## 2014-04-24 NOTE — ED Notes (Signed)
Patient with c/o left ankle pain. H/o fracture to same ankle. States her ankle will hurt and swell when she works, works 12 hour shifts on her feet. No new injury, no pain complaints at present.

## 2014-04-24 NOTE — ED Notes (Signed)
Patient with no complaints at this time. Respirations even and unlabored. Skin warm/dry. Discharge instructions reviewed with patient at this time. Patient given opportunity to voice concerns/ask questions. Patient discharged at this time and left Emergency Department with steady gait.   

## 2014-04-24 NOTE — ED Notes (Signed)
Noted swelling to left lateral ankle.

## 2014-04-24 NOTE — ED Provider Notes (Signed)
CSN: [id number]     Arrival date & time 04/24/14  4098 History   First MD Initiated Contact with Patient 04/24/14 [phone]     Chief Complaint  Patient presents with  . Ankle Pain     (Consider location/radiation/quality/duration/timing/severity/associated sxs/prior Treatment) HPI Comments: The patient is a 23 year old female who presents to the emergency department with complaint of left ankle pain. The patient states that in 2000 08/29/2012 she sustained an injury/fracture of the left ankle. She states time to time she has pain, but these usually goes away with conservative management. She has recently started a new job that involves her standing or walking for 12 hour shifts. The patient states she cannot complete her shift without her foot hurting. The patient states that time she has pain that goes from her toes up to just below her knee. He mostly has pain on the lateral portion of her ankle, but it hurts while she is standing. The patient wears steel toe shoes at her job. She was seen and evaluated on July 13 for this problem and placed on Naprosyn, she states that this is not helping. She was given an ankle splint but states that that makes the pain worse instead of better.  Patient is a 23 y.o. female presenting with ankle pain. The history is provided by the patient.  Ankle Pain Location:  Ankle Ankle location:  L ankle Associated symptoms: no back pain and no neck pain     Past Medical History  Diagnosis Date  . 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 Headaches, cluster    History reviewed. No pertinent past surgical history. No family history on file. History  Substance Use Topics  . Smoking status: Light Tobacco Smoker  . Smokeless tobacco: Not on file  . Alcohol Use: Yes     Comment: "occasionally"   OB History   Grav Para Term Preterm Abortions TAB SAB Ect Mult Living                 Review of Systems  Constitutional: Negative for activity change.       All ROS Neg except as noted in HPI   HENT: Negative for nosebleeds.   Eyes: Negative for photophobia and discharge.  Respiratory: Negative for cough, shortness of breath and wheezing.   Cardiovascular: Negative for chest pain and palpitations.  Gastrointestinal: Negative for abdominal pain and blood in stool.  Genitourinary: Negative for dysuria, frequency and hematuria.  Musculoskeletal: Positive for arthralgias. Negative for back pain and neck pain.  Skin: Negative.   Neurological: Negative for dizziness, seizures and speech difficulty.  Psychiatric/Behavioral: Negative for hallucinations and confusion.      Allergies  Latex  Home Medications   Prior to Admission medications   Medication Sig Start Date End Date Taking? Authorizing Provider  citalopram (CELEXA) 20 MG tablet Take 20 mg by mouth every morning.    Historical Provider, MD  naproxen (NAPROSYN) 500 MG tablet Take 1 tablet (500 mg total) by mouth 2 (two) times daily with a meal. 04/07/14   Vida Roller, MD  traZODone (DESYREL) 50 MG tablet Take 50 mg by mouth at bedtime.    Historical Provider, MD   BP 123/77  Pulse 84  Temp(Src) 97.8 F (36.6 C) (Oral)  Resp 16  Ht 5' 2.5" (1.588 m)  Wt 150 lb (68.04 kg)  BMI 26.98 kg/m2  SpO2 100%  LMP 04/08/2014 Physical Exam  Nursing note and vitals reviewed. Constitutional: She is oriented to person, place, and  time. She appears well-developed and well-nourished.  Non-toxic appearance.  HENT:  Head: Normocephalic.  Right Ear: Tympanic membrane and external ear normal.  Left Ear: Tympanic membrane and external ear normal.  Eyes: EOM and lids are normal. Pupils are equal, round, and reactive to light.  Neck: Normal range of motion. Neck supple. Carotid bruit is not present.  Cardiovascular: Normal rate, regular rhythm, normal heart sounds, intact distal pulses and normal pulses.   Pulmonary/Chest: Breath sounds normal. No respiratory distress.  Abdominal: Soft. Bowel sounds are normal. There is no  tenderness. There is no guarding.  Musculoskeletal: Normal range of motion.  There is good range of motion of the left hip and knee. There is no temperature change of the left lower extremity. Effusion of the left ankle. There is left lateral malleolus pain with inversion. There is callus formation under the metatarsal heads. There is some discomfort in between the toes when pressure is applied to the metatarsal head areas. There is full range of motion of the toes. The capillary refill is less than 2 seconds. The dorsalis pedis pulses 2+ bilaterally. There is a negative Homans sign bilaterally.   Lymphadenopathy:       Head (right side): No submandibular adenopathy present.       Head (left side): No submandibular adenopathy present.    She has no cervical adenopathy.  Neurological: She is alert and oriented to person, place, and time. She has normal strength. No cranial nerve deficit or sensory deficit.  Skin: Skin is warm and dry.  Psychiatric: She has a normal mood and affect. Her speech is normal.    ED Course  Procedures (including critical care time) Labs Review Labs Reviewed - No data to display  Imaging Review Dg Ankle Complete Left  04/24/2014   CLINICAL DATA:  Lateral left ankle pain for the past 3 years.  EXAM: LEFT ANKLE COMPLETE - 3+ VIEW  COMPARISON:  No priors.  FINDINGS: Multiple views of the left ankle demonstrate no acute displaced fracture, subluxation, dislocation, or soft tissue abnormality.  IMPRESSION: No acute radiographic abnormality of the left ankle.   Electronically Signed   By: Trudie Reedaniel  Entrikin M.D.   On: 04/24/2014 09:05     EKG Interpretation None      MDM I have reviewed the emergency department visits for July 13, also reviewed the x-rays from 2013. I evaluated the x-rays from today July 30. The x-ray today shows no acute fracture, subluxation, or dislocation. I reviewed the vital signs and findings with the patient. Discussed with her the need to see an  orthopedic or podiatry specialist concerning her continued problems with pain. The patient has anti-inflammatory medication. She has an ankle stirrup splint, but states that this causes her more pain than it does hurt a good. Will issue the patient a work note to return on Tuesday, August 4. Patient is referred to orthopedics, and given the name of the podiatry specialist in this area.    Final diagnoses:  None    *I have reviewed nursing notes, vital signs, and all appropriate lab and imaging results for this patient.Kathie Dike**    Siddhant Hashemi M Lamin Chandley, PA-C 04/24/14 [phone]

## 2014-04-26 NOTE — ED Provider Notes (Signed)
Medical screening examination/treatment/procedure(s) were performed by non-physician practitioner and as supervising physician I was immediately available for consultation/collaboration.   EKG Interpretation None        Vondra Aldredge M Quanta Robertshaw, DO 04/26/14 0827 

## 2014-05-24 IMAGING — CR DG CHEST 2V
2 series · 2 of 2 positions shown · non-contrast
Comparison: None.

CLINICAL DATA: Cough.

EXAM:
CHEST  2 VIEW

[view not recorded (1 of 2)]
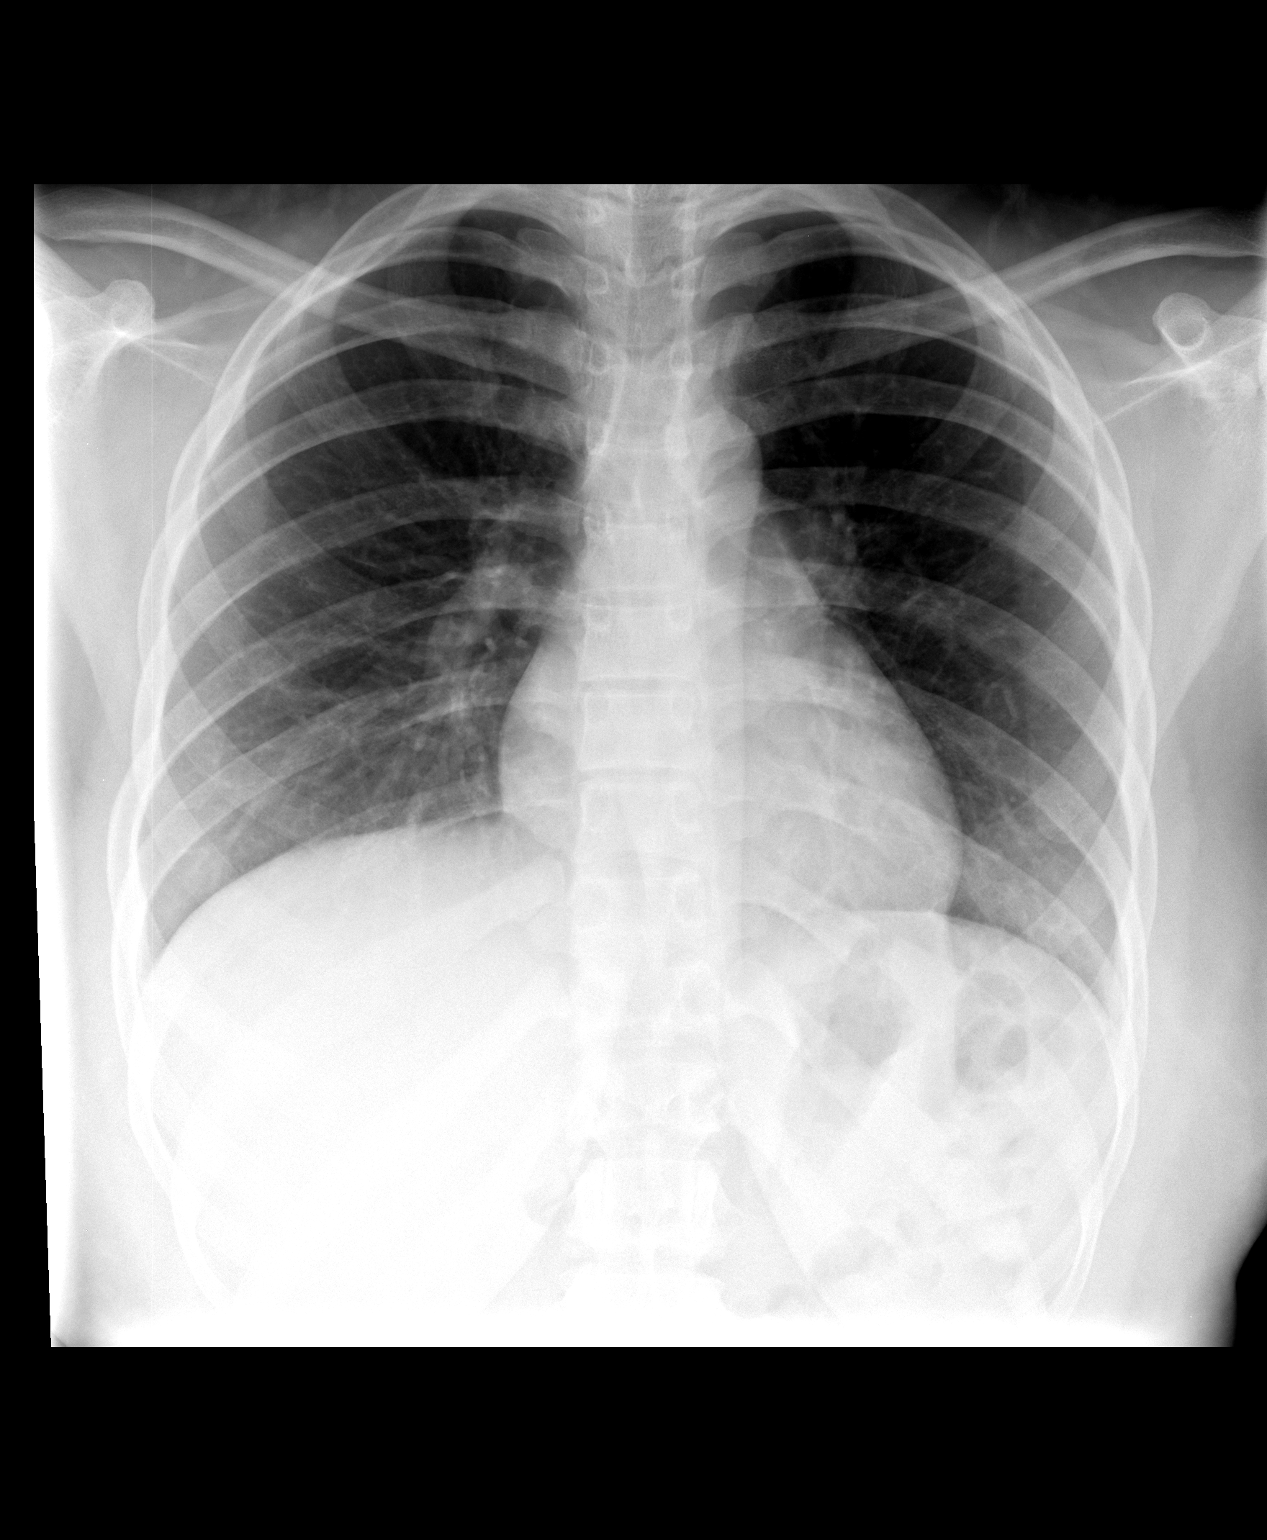

[view not recorded (2 of 2)]
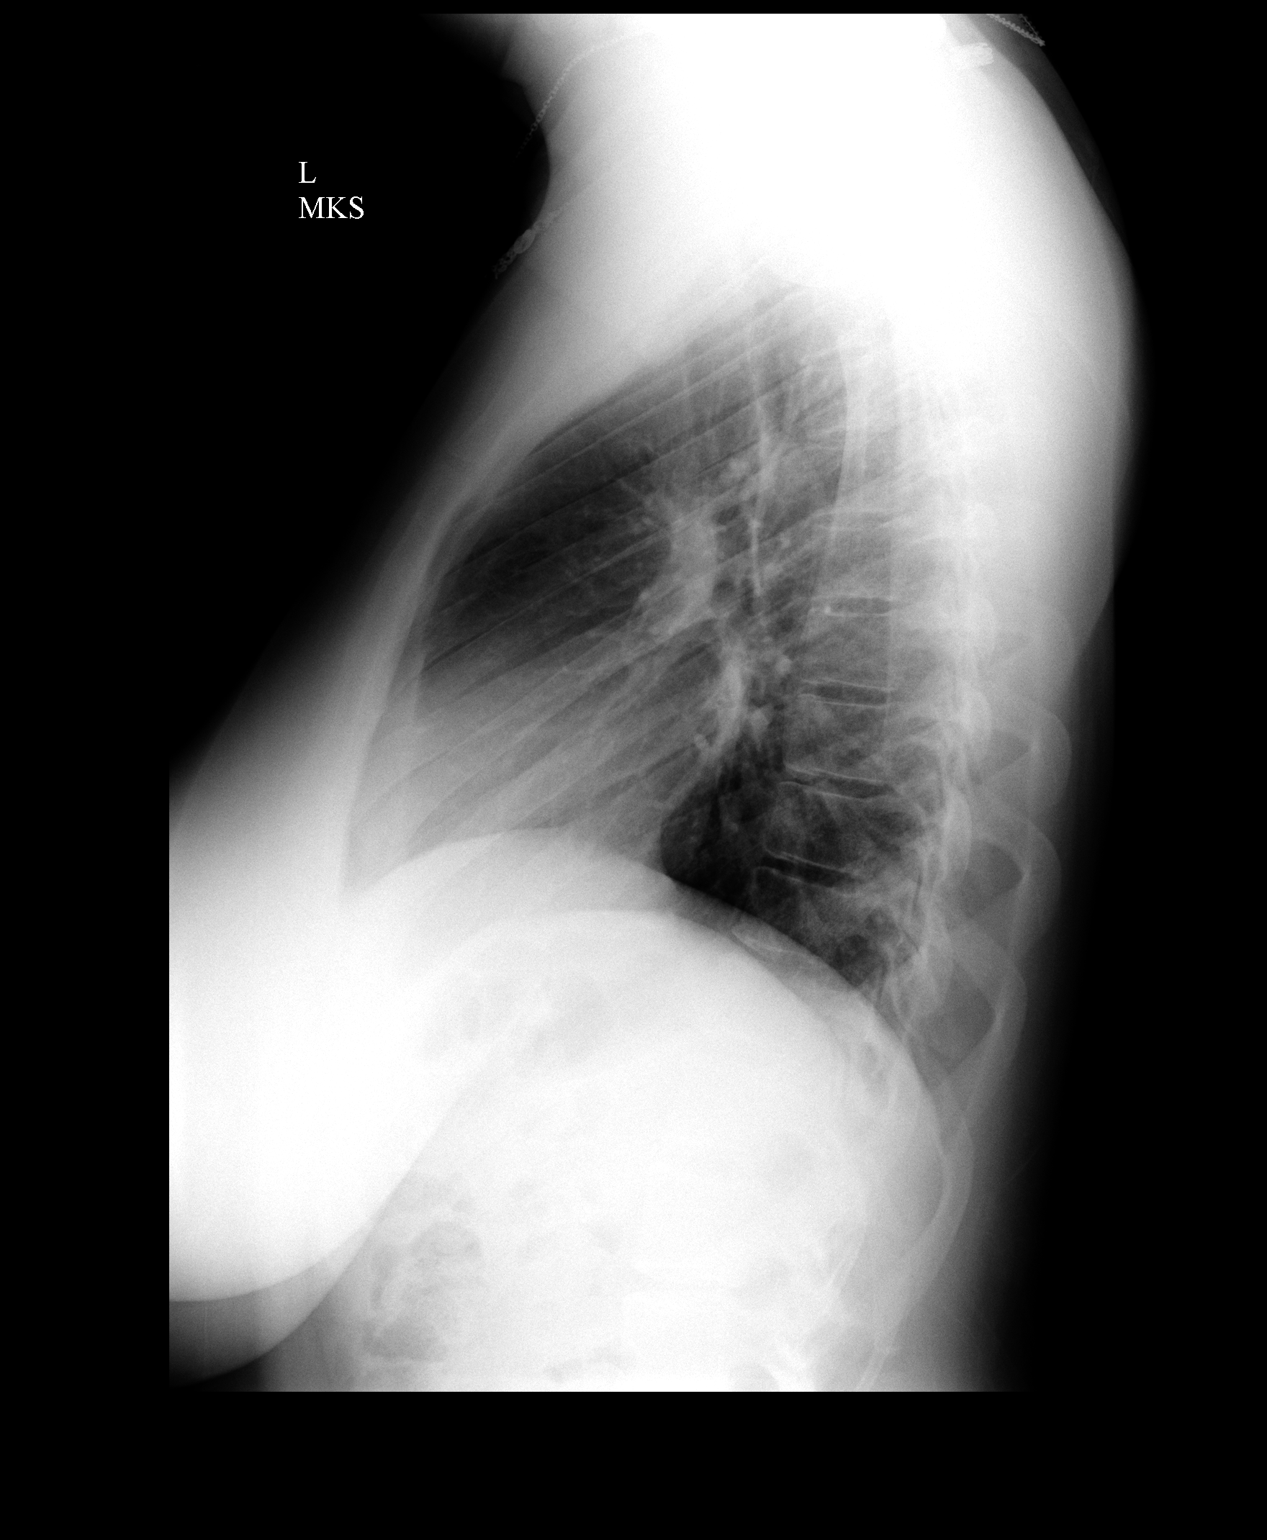

[2 of 2 positions shown; findings below may reference images not displayed]

FINDINGS: The heart size and mediastinal contours are within normal limits.
Both lungs are clear. No pneumothorax or pleural effusion is noted.
The visualized skeletal structures are unremarkable.
IMPRESSION: No acute cardiopulmonary abnormality seen.

## 2014-06-09 ENCOUNTER — Encounter (HOSPITAL_COMMUNITY): Payer: Self-pay | Admitting: Emergency Medicine

## 2014-06-09 ENCOUNTER — Emergency Department (HOSPITAL_COMMUNITY)
Admission: EM | Admit: 2014-06-09 | Discharge: 2014-06-09 | Disposition: A | Discharge status: Home / Self Care | Payer: Self-pay | Attending: Emergency Medicine | Admitting: Emergency Medicine

## 2014-06-09 DIAGNOSIS — N949 Unspecified condition associated with female genital organs and menstrual cycle: Secondary | ICD-10-CM | POA: Insufficient documentation

## 2014-06-09 DIAGNOSIS — Z3202 Encounter for pregnancy test, result negative: Secondary | ICD-10-CM | POA: Insufficient documentation

## 2014-06-09 DIAGNOSIS — N12 Tubulo-interstitial nephritis, not specified as acute or chronic: Secondary | ICD-10-CM | POA: Insufficient documentation

## 2014-06-09 DIAGNOSIS — B9689 Other specified bacterial agents as the cause of diseases classified elsewhere: Secondary | ICD-10-CM | POA: Insufficient documentation

## 2014-06-09 DIAGNOSIS — R350 Frequency of micturition: Secondary | ICD-10-CM | POA: Insufficient documentation

## 2014-06-09 DIAGNOSIS — N760 Acute vaginitis: Secondary | ICD-10-CM | POA: Insufficient documentation

## 2014-06-09 DIAGNOSIS — N939 Abnormal uterine and vaginal bleeding, unspecified: Secondary | ICD-10-CM

## 2014-06-09 DIAGNOSIS — Z872 Personal history of diseases of the skin and subcutaneous tissue: Secondary | ICD-10-CM | POA: Insufficient documentation

## 2014-06-09 DIAGNOSIS — R51 Headache: Secondary | ICD-10-CM | POA: Insufficient documentation

## 2014-06-09 DIAGNOSIS — Z91040 Latex allergy status: Secondary | ICD-10-CM | POA: Insufficient documentation

## 2014-06-09 DIAGNOSIS — A499 Bacterial infection, unspecified: Secondary | ICD-10-CM | POA: Insufficient documentation

## 2014-06-09 DIAGNOSIS — N938 Other specified abnormal uterine and vaginal bleeding: Secondary | ICD-10-CM | POA: Insufficient documentation

## 2014-06-09 DIAGNOSIS — F17200 Nicotine dependence, unspecified, uncomplicated: Secondary | ICD-10-CM | POA: Insufficient documentation

## 2014-06-09 LAB — URINALYSIS, ROUTINE W REFLEX MICROSCOPIC
Bilirubin Urine: NEGATIVE
GLUCOSE, UA: NEGATIVE mg/dL
Ketones, ur: NEGATIVE mg/dL
Nitrite: POSITIVE — AB
Specific Gravity, Urine: 1.01 (ref 1.005–1.030)
UROBILINOGEN UA: 0.2 mg/dL (ref 0.0–1.0)
pH: 6 (ref 5.0–8.0)

## 2014-06-09 LAB — CBC WITH DIFFERENTIAL/PLATELET
Basophils Absolute: 0 10*3/uL (ref 0.0–0.1)
Basophils Relative: 0 % (ref 0–1)
Eosinophils Absolute: 0 10*3/uL (ref 0.0–0.7)
Eosinophils Relative: 0 % (ref 0–5)
HCT: 35.4 % — ABNORMAL LOW (ref 36.0–46.0)
Hemoglobin: 11.6 g/dL — ABNORMAL LOW (ref 12.0–15.0)
Lymphocytes Relative: 5 % — ABNORMAL LOW (ref 12–46)
Lymphs Abs: 0.7 10*3/uL (ref 0.7–4.0)
MCH: 26.9 pg (ref 26.0–34.0)
MCHC: 32.8 g/dL (ref 30.0–36.0)
MCV: 81.9 fL (ref 78.0–100.0)
Monocytes Absolute: 1 10*3/uL (ref 0.1–1.0)
Monocytes Relative: 7 % (ref 3–12)
Neutro Abs: 12.4 10*3/uL — ABNORMAL HIGH (ref 1.7–7.7)
Neutrophils Relative %: 88 % — ABNORMAL HIGH (ref 43–77)
PLATELETS: 309 10*3/uL (ref 150–400)
RBC: 4.32 MIL/uL (ref 3.87–5.11)
RDW: 13.7 % (ref 11.5–15.5)
WBC: 14.1 10*3/uL — AB (ref 4.0–10.5)

## 2014-06-09 LAB — URINE MICROSCOPIC-ADD ON

## 2014-06-09 LAB — WET PREP, GENITAL
TRICH WET PREP: NONE SEEN
Yeast Wet Prep HPF POC: NONE SEEN

## 2014-06-09 LAB — POC URINE PREG, ED: Preg Test, Ur: NEGATIVE

## 2014-06-09 MED ORDER — DEXTROSE 5 % IV SOLN
1.0000 g | Freq: Once | INTRAVENOUS | Status: AC
  Administered 2014-06-09: 1 g via INTRAVENOUS
  Filled 2014-06-09: qty 10

## 2014-06-09 MED ORDER — METRONIDAZOLE 500 MG PO TABS: 500.0000 mg | ORAL_TABLET | Freq: Two times a day (BID) | ORAL | Status: DC

## 2014-06-09 MED ORDER — HYDROCODONE-ACETAMINOPHEN 5-325 MG PO TABS: 1.0000 | ORAL_TABLET | ORAL | Status: DC | PRN

## 2014-06-09 MED ORDER — SODIUM CHLORIDE 0.9 % IV SOLN
INTRAVENOUS | Status: DC
  Administered 2014-06-09: 20:00:00 via INTRAVENOUS

## 2014-06-09 MED ORDER — CEPHALEXIN 500 MG PO CAPS: 500.0000 mg | ORAL_CAPSULE | Freq: Four times a day (QID) | ORAL | Status: DC

## 2014-06-09 MED ORDER — ACETAMINOPHEN 325 MG PO TABS
650.0000 mg | ORAL_TABLET | Freq: Once | ORAL | Status: AC
  Administered 2014-06-09: 650 mg via ORAL
  Filled 2014-06-09: qty 2

## 2014-06-09 MED ORDER — PROMETHAZINE HCL 12.5 MG PO TABS: 12.5000 mg | ORAL_TABLET | Freq: Four times a day (QID) | ORAL | Status: DC | PRN

## 2014-06-09 NOTE — ED Provider Notes (Signed)
CSN: [id number]     Arrival date & time 06/09/14  1548 History   First MD Initiated Contact with Patient 06/09/14 1753   This chart was scribed for non-physician practitioner Kerrie Buffalo, NP,  working with Hurman Horn, MD by Gwenevere Abbot, ED scribe. This patient was seen in room APFT20/APFT20 and the patient's care was started at 5:56 PM.   Chief Complaint  Patient presents with  . Back Pain   Patient is a 23 y.o. female presenting with back pain. The history is provided by the patient. No language interpreter was used.  Back Pain Pain location: Flank pain. Quality:  Aching and stabbing Pain severity:  Severe Timing:  Constant Progression:  Worsening Chronicity:  New  HPI Comments:  Bethany Gomez is a 23 y.o. female who presents to the Emergency Department complaining of back pain, with associated symtpoms of flank pain, nausea and frequency onset 3 days ago. Pt has never had a UTI. Pt denies itching, or dysuria. Pt reports that her period has been irregular recently. Pt is sexually active, and has not been using BC. Pt reports that she was seen at the [facility] two months ago. Pt reports that she has been with the same partner for the past 5 years.   Past Medical History  Diagnosis Date  . 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 Headaches, cluster    History reviewed. No pertinent past surgical history. History reviewed. No pertinent family history. History  Substance Use Topics  . Smoking status: Light Tobacco Smoker  . Smokeless tobacco: Not on file  . Alcohol Use: Yes     Comment: "occasionally"   OB History   Grav Para Term Preterm Abortions TAB SAB Ect Mult Living                 Review of Systems  Genitourinary: Positive for frequency and vaginal bleeding (Irregular period).  Musculoskeletal: Positive for back pain.  All other systems reviewed and are negative.     Allergies  Latex  Home Medications   Prior to Admission medications   Medication Sig Start Date End Date  Taking? Authorizing Provider  acetaminophen-codeine (TYLENOL #3) 300-30 MG per tablet 1 or 2 at hs, or q6h prn pain. 04/24/14   Kathie Dike, PA-C  ibuprofen (ADVIL,MOTRIN) 200 MG tablet Take 400 mg by mouth daily as needed for moderate pain.    Historical Provider, MD  naproxen (NAPROSYN) 500 MG tablet Take 1 tablet (500 mg total) by mouth 2 (two) times daily with a meal. 04/07/14   Vida Roller, MD   BP 135/83  Pulse 114  Temp(Src) 99.8 F (37.7 C) (Oral)  Resp 18  Ht 5' 2.5" (1.588 m)  Wt 146 lb 6 oz (66.395 kg)  BMI 26.33 kg/m2  SpO2 100%  LMP 06/03/2014 Physical Exam  Nursing note and vitals reviewed. Constitutional: She is oriented to person, place, and time. She appears well-developed and well-nourished.  HENT:  Head: Normocephalic and atraumatic.  Eyes: EOM are normal.  Neck: Normal range of motion. Neck supple.  Cardiovascular: Normal rate and regular rhythm.   Pulmonary/Chest: Effort normal and breath sounds normal.  Abdominal: Soft. Normal appearance. There is tenderness in the suprapubic area. There is CVA tenderness (right). There is no rigidity, no rebound and no guarding.  Right flank pain.   Musculoskeletal: Normal range of motion.  Neurological: She is alert and oriented to person, place, and time.  Skin: Skin is warm and dry.  Psychiatric:  She has a normal mood and affect. Her behavior is normal.    ED Course  Procedures  DIAGNOSTIC STUDIES: Oxygen Saturation is 100% on RA, normal by my interpretation.  COORDINATION OF CARE: 6:01 PM-Discussed treatment plan which includes Recephin, and 1 L of fluids with pt at bedside and pt agreed to plan.  Labs Review Labs Reviewed  URINALYSIS, ROUTINE W REFLEX MICROSCOPIC - Abnormal; Notable for the following:    APPearance HAZY (*)    Hgb urine dipstick LARGE (*)    Protein, ur TRACE (*)    Nitrite POSITIVE (*)    Leukocytes, UA SMALL (*)    All other components within normal limits  URINE MICROSCOPIC-ADD ON -  Abnormal; Notable for the following:    Squamous Epithelial / LPF MANY (*)    Bacteria, UA MANY (*)    All other components within normal limits  POC URINE PREG, ED     MDM  23 y.o. female with right CVA tenderness and UTI, will treat outpatient for pyelonephritis. Rocephin 1 gram IV, NS IV, medication for nausea, pain management. Stable for discharge without fever or signs of sepsis. Discussed with the patient and all questioned fully answered. She will return  if any problems arise.    Medication List    TAKE these medications       cephALEXin 500 MG capsule  Commonly known as:  KEFLEX  Take 1 capsule (500 mg total) by mouth 4 (four) times daily.     HYDROcodone-acetaminophen 5-325 MG per tablet  Commonly known as:  NORCO/VICODIN  Take 1 tablet by mouth every 4 (four) hours as needed.     metroNIDAZOLE 500 MG tablet  Commonly known as:  FLAGYL  Take 1 tablet (500 mg total) by mouth 2 (two) times daily.     promethazine 12.5 MG tablet  Commonly known as:  PHENERGAN  Take 1 tablet (12.5 mg total) by mouth every 6 (six) hours as needed for nausea or vomiting.      ASK your doctor about these medications       acetaminophen 500 MG tablet  Commonly known as:  TYLENOL  Take 500 mg by mouth every 6 (six) hours as needed for mild pain or moderate pain.     ibuprofen 200 MG tablet  Commonly known as:  ADVIL,MOTRIN  Take 400 mg by mouth daily as needed for moderate pain.        I personally performed the services described in this documentation, which was scribed in my presence. The recorded information has been reviewed and is accurate.     [place], [place] 06/13/14 2047

## 2014-06-09 NOTE — ED Notes (Signed)
Low back pain for 3 days with pain to rt buttock.

## 2014-06-11 LAB — GC/CHLAMYDIA PROBE AMP
CT Probe RNA: NEGATIVE
GC Probe RNA: NEGATIVE

## 2014-06-12 LAB — URINE CULTURE: Colony Count: 10000

## 2014-06-13 ENCOUNTER — Telehealth (HOSPITAL_BASED_OUTPATIENT_CLINIC_OR_DEPARTMENT_OTHER): Payer: Self-pay | Admitting: Emergency Medicine

## 2014-06-13 NOTE — Telephone Encounter (Signed)
Post ED Visit - Positive Culture Follow-up  Culture report reviewed by antimicrobial stewardship pharmacist:  Wes Dulaney, Pharm.D., BCPS  Celedonio Miyamoto, Pharm.D., BCPS  Georgina Pillion, Pharm.D., BCPS  [place], [place].D., BCPS, AAHIVP  Estella Husk, Pharm.D., BCPS, AAHIVP  Carly Sabat, Pharm.D.  Enzo Bi, Pharm.D.   Positive urine culture 10,000 E. Coli Treated with cephalexin qid x 5 days  Flagyl  po tabs bid x 7 days,organism sensitive to the same and no further patient follow-up is required at this time.  Berle Mull 06/13/2014, 12:02 PM

## 2014-06-25 NOTE — ED Provider Notes (Signed)
Medical screening examination/treatment/procedure(s) were performed by non-physician practitioner and as supervising physician I was immediately available for consultation/collaboration.   EKG Interpretation None       Coleton Woon M Havannah Streat, MD 06/25/14 1321 

## 2014-07-22 ENCOUNTER — Encounter (HOSPITAL_COMMUNITY): Payer: Self-pay | Admitting: Emergency Medicine

## 2014-07-22 ENCOUNTER — Emergency Department (HOSPITAL_COMMUNITY)
Admission: EM | Admit: 2014-07-22 | Discharge: 2014-07-22 | Disposition: A | Discharge status: Home / Self Care | Payer: Self-pay | Attending: Emergency Medicine | Admitting: Emergency Medicine

## 2014-07-22 DIAGNOSIS — Z91040 Latex allergy status: Secondary | ICD-10-CM | POA: Insufficient documentation

## 2014-07-22 DIAGNOSIS — R51 Headache: Secondary | ICD-10-CM | POA: Insufficient documentation

## 2014-07-22 DIAGNOSIS — Z79899 Other long term (current) drug therapy: Secondary | ICD-10-CM | POA: Insufficient documentation

## 2014-07-22 DIAGNOSIS — Z792 Long term (current) use of antibiotics: Secondary | ICD-10-CM | POA: Insufficient documentation

## 2014-07-22 DIAGNOSIS — M791 Myalgia: Secondary | ICD-10-CM | POA: Insufficient documentation

## 2014-07-22 DIAGNOSIS — Z720 Tobacco use: Secondary | ICD-10-CM | POA: Insufficient documentation

## 2014-07-22 DIAGNOSIS — Z3202 Encounter for pregnancy test, result negative: Secondary | ICD-10-CM | POA: Insufficient documentation

## 2014-07-22 DIAGNOSIS — H9209 Otalgia, unspecified ear: Secondary | ICD-10-CM | POA: Insufficient documentation

## 2014-07-22 DIAGNOSIS — M549 Dorsalgia, unspecified: Secondary | ICD-10-CM | POA: Insufficient documentation

## 2014-07-22 DIAGNOSIS — N390 Urinary tract infection, site not specified: Secondary | ICD-10-CM

## 2014-07-22 HISTORY — DX: Premenstrual dysphoric disorder: F32.81

## 2014-07-22 LAB — CBC WITH DIFFERENTIAL/PLATELET
Basophils Absolute: 0 10*3/uL (ref 0.0–0.1)
Basophils Relative: 0 % (ref 0–1)
Eosinophils Absolute: 0 10*3/uL (ref 0.0–0.7)
Eosinophils Relative: 0 % (ref 0–5)
HEMATOCRIT: 33.9 % — AB (ref 36.0–46.0)
HEMOGLOBIN: 10.9 g/dL — AB (ref 12.0–15.0)
LYMPHS ABS: 2 10*3/uL (ref 0.7–4.0)
Lymphocytes Relative: 14 % (ref 12–46)
MCH: 26.1 pg (ref 26.0–34.0)
MCHC: 32.2 g/dL (ref 30.0–36.0)
MCV: 81.3 fL (ref 78.0–100.0)
MONO ABS: 2.2 10*3/uL — AB (ref 0.1–1.0)
MONOS PCT: 15 % — AB (ref 3–12)
NEUTROS ABS: 10.1 10*3/uL — AB (ref 1.7–7.7)
Neutrophils Relative %: 71 % (ref 43–77)
Platelets: 312 10*3/uL (ref 150–400)
RBC: 4.17 MIL/uL (ref 3.87–5.11)
RDW: 14.1 % (ref 11.5–15.5)
WBC: 14.3 10*3/uL — ABNORMAL HIGH (ref 4.0–10.5)

## 2014-07-22 LAB — URINE MICROSCOPIC-ADD ON

## 2014-07-22 LAB — URINALYSIS, ROUTINE W REFLEX MICROSCOPIC
Bilirubin Urine: NEGATIVE
Glucose, UA: NEGATIVE mg/dL
Nitrite: POSITIVE — AB
Specific Gravity, Urine: 1.025 (ref 1.005–1.030)
Urobilinogen, UA: 0.2 mg/dL (ref 0.0–1.0)
pH: 6 (ref 5.0–8.0)

## 2014-07-22 LAB — COMPREHENSIVE METABOLIC PANEL
ALK PHOS: 53 U/L (ref 39–117)
ALT: 13 U/L (ref 0–35)
ANION GAP: 15 (ref 5–15)
AST: 14 U/L (ref 0–37)
Albumin: 3.4 g/dL — ABNORMAL LOW (ref 3.5–5.2)
BILIRUBIN TOTAL: 0.4 mg/dL (ref 0.3–1.2)
BUN: 7 mg/dL (ref 6–23)
CO2: 22 meq/L (ref 19–32)
Calcium: 9.2 mg/dL (ref 8.4–10.5)
Chloride: 98 mEq/L (ref 96–112)
Creatinine, Ser: 0.7 mg/dL (ref 0.50–1.10)
GLUCOSE: 100 mg/dL — AB (ref 70–99)
POTASSIUM: 3.4 meq/L — AB (ref 3.7–5.3)
Sodium: 135 mEq/L — ABNORMAL LOW (ref 137–147)
TOTAL PROTEIN: 8.1 g/dL (ref 6.0–8.3)

## 2014-07-22 LAB — PREGNANCY, URINE: PREG TEST UR: NEGATIVE

## 2014-07-22 MED ORDER — ACETAMINOPHEN 325 MG PO TABS
650.0000 mg | ORAL_TABLET | Freq: Once | ORAL | Status: AC
  Administered 2014-07-22: 650 mg via ORAL
  Filled 2014-07-22: qty 2

## 2014-07-22 MED ORDER — SODIUM CHLORIDE 0.9 % IV BOLUS (SEPSIS)
1000.0000 mL | Freq: Once | INTRAVENOUS | Status: AC
  Administered 2014-07-22: 1000 mL via INTRAVENOUS

## 2014-07-22 MED ORDER — DEXTROSE 5 % IV SOLN
1.0000 g | Freq: Once | INTRAVENOUS | Status: AC
  Administered 2014-07-22: 1 g via INTRAVENOUS
  Filled 2014-07-22: qty 10

## 2014-07-22 MED ORDER — KETOROLAC TROMETHAMINE 30 MG/ML IJ SOLN
30.0000 mg | Freq: Once | INTRAMUSCULAR | Status: AC
  Administered 2014-07-22: 30 mg via INTRAVENOUS
  Filled 2014-07-22: qty 1

## 2014-07-22 MED ORDER — CEPHALEXIN 500 MG PO CAPS
500.0000 mg | ORAL_CAPSULE | Freq: Four times a day (QID) | ORAL | Status: DC
Start: 2014-07-22 — End: 2016-08-09

## 2014-07-22 MED ORDER — TRAMADOL HCL 50 MG PO TABS: 50.0000 mg | ORAL_TABLET | Freq: Four times a day (QID) | ORAL | Status: DC | PRN

## 2014-07-22 NOTE — ED Provider Notes (Signed)
CSN: [id number]     Arrival date & time 07/22/14  [id number] History  This chart was scribed for Benny LennertJoseph L Lemon Sternberg, MD by Annye AsaAnna Dorsett, ED Scribe. This patient was seen in room APA03/APA03 and the patient's care was started at 8:00 AM.    Chief Complaint  Patient presents with  . Fever   Patient is a 23 y.o. female presenting with fever. The history is provided by the patient ((pt complains of fever and generalized myalgias)). No language interpreter was used.  Fever Max temp prior to arrival:  102.6 Severity:  Moderate Onset quality:  Gradual Duration:  3 days Relieved by:  None tried Worsened by:  Nothing tried Ineffective treatments:  None tried Associated symptoms: ear pain, headaches and myalgias   Associated symptoms: no chest pain, no congestion, no cough, no diarrhea, no dysuria and no rash   Headaches:    Severity:  Mild   Onset quality:  Gradual   Duration:  3 days Myalgias:    Location:  Generalized   Quality:  Aching   Severity:  Mild   Onset quality:  Gradual   Duration:  3 days   HPI Comments: Bethany Gomez is a 23 y.o. female who presents to the Emergency Department complaining of three days of fever (TMAX 102.6) with associated generalized myalgias. She reports back pain and headaches, as well as left ear pain as of this morning. She denies any urinary symptoms; denies dysuria, hematuria. She has not taken any medication for her symptoms.   Patient was recently being treated for a UTI.    Past Medical History  Diagnosis Date  . 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 Headaches, cluster   . Premenstrual dysphoric disorder    History reviewed. No pertinent past surgical history. History reviewed. No pertinent family history. History  Substance Use Topics  . Smoking status: Light Tobacco Smoker -- 0.03 packs/day for 3 years    Types: Cigarettes  . Smokeless tobacco: Never Used  . Alcohol Use: Yes     Comment: "occasionally"   OB History   Grav Para Term Preterm Abortions TAB SAB  Ect Mult Living            0     Review of Systems  Constitutional: Positive for fever. Negative for appetite change and fatigue.  HENT: Positive for ear pain. Negative for congestion, ear discharge and sinus pressure.   Eyes: Negative for discharge.  Respiratory: Negative for cough.   Cardiovascular: Negative for chest pain.  Gastrointestinal: Negative for abdominal pain and diarrhea.  Genitourinary: Negative for dysuria, frequency and hematuria.  Musculoskeletal: Positive for back pain and myalgias.  Skin: Negative for rash.  Neurological: Positive for headaches. Negative for seizures.  Psychiatric/Behavioral: Negative for hallucinations.    Allergies  Latex  Home Medications   Prior to Admission medications   Medication Sig Start Date End Date Taking? Authorizing Provider  acetaminophen (TYLENOL) 500 MG tablet Take 500 mg by mouth every 6 (six) hours as needed for mild pain or moderate pain.    Historical Provider, MD  cephALEXin (KEFLEX) 500 MG capsule Take 1 capsule (500 mg total) by mouth 4 (four) times daily. 06/09/14   Hope Orlene OchM Neese, NP  HYDROcodone-acetaminophen (NORCO/VICODIN) 5-325 MG per tablet Take 1 tablet by mouth every 4 (four) hours as needed. 06/09/14   Hope Orlene OchM Neese, NP  ibuprofen (ADVIL,MOTRIN) 200 MG tablet Take 400 mg by mouth daily as needed for moderate pain.    Historical Provider, MD  metroNIDAZOLE (FLAGYL) 500 MG tablet Take 1 tablet (500 mg total) by mouth 2 (two) times daily. 06/09/14   Hope Orlene OchM Neese, NP  promethazine (PHENERGAN) 12.5 MG tablet Take 1 tablet (12.5 mg total) by mouth every 6 (six) hours as needed for nausea or vomiting. 06/09/14   Hope Orlene OchM Neese, NP   BP 128/81  Pulse 115  Temp(Src) 101.7 F (38.7 C) (Oral)  Resp 18  Ht 5\' 2"  (1.575 m)  Wt 145 lb (65.772 kg)  BMI 26.51 kg/m2  SpO2 100%  LMP 07/15/2014 Physical Exam  Constitutional: She is oriented to person, place, and time. She appears well-developed.  HENT:  Head: Normocephalic.   Eyes: Conjunctivae and EOM are normal. No scleral icterus.  Neck: Neck supple. No thyromegaly present.  Cardiovascular: Normal rate and regular rhythm.  Exam reveals no gallop and no friction rub.   No murmur heard. Pulmonary/Chest: No stridor. She has no wheezes. She has no rales. She exhibits no tenderness.  Abdominal: She exhibits no distension. There is no tenderness. There is no rebound.  Musculoskeletal: Normal range of motion. She exhibits no edema.  Mild lower back tenderness  Lymphadenopathy:    She has no cervical adenopathy.  Neurological: She is oriented to person, place, and time. She exhibits normal muscle tone. Coordination normal.  Skin: No rash noted. No erythema.  Psychiatric: She has a normal mood and affect. Her behavior is normal.    ED Course  Procedures   DIAGNOSTIC STUDIES: Oxygen Saturation is 100% on RA, normal by my interpretation.    COORDINATION OF CARE: 8:02 AM Discussed treatment plan with pt at bedside and pt agreed to plan.   Labs Review Labs Reviewed - No data to display  Imaging Review No results found.   EKG Interpretation None      MDM   Final diagnoses:  None    uti tx with keflex  The chart was scribed for me under my direct supervision.  I personally performed the history, physical, and medical decision making and all procedures in the evaluation of this patient.Benny Lennert.      Dazhane Villagomez L Rosangelica Pevehouse, MD 07/22/14 [phone]

## 2014-07-22 NOTE — Discharge Instructions (Signed)
Drink plenty of fluids.  Follow up with your md next week °

## 2014-07-22 NOTE — ED Notes (Signed)
Patient c/o fever, body aches, and headache x3 days. Per patient highest fever 102.6. Denies taking any medication for fever. Per patient left ear started hurting this morning. Mother reports patient recently being treated her for UTI.

## 2014-11-25 ENCOUNTER — Encounter (HOSPITAL_COMMUNITY): Payer: Self-pay | Admitting: Emergency Medicine

## 2014-11-25 ENCOUNTER — Emergency Department (HOSPITAL_COMMUNITY)
Admission: EM | Admit: 2014-11-25 | Discharge: 2014-11-25 | Disposition: A | Discharge status: Home / Self Care | Payer: Self-pay | Attending: Emergency Medicine | Admitting: Emergency Medicine

## 2014-11-25 DIAGNOSIS — Z8742 Personal history of other diseases of the female genital tract: Secondary | ICD-10-CM | POA: Insufficient documentation

## 2014-11-25 DIAGNOSIS — R110 Nausea: Secondary | ICD-10-CM | POA: Insufficient documentation

## 2014-11-25 DIAGNOSIS — Z87891 Personal history of nicotine dependence: Secondary | ICD-10-CM | POA: Insufficient documentation

## 2014-11-25 DIAGNOSIS — Z8669 Personal history of other diseases of the nervous system and sense organs: Secondary | ICD-10-CM | POA: Insufficient documentation

## 2014-11-25 DIAGNOSIS — Z87440 Personal history of urinary (tract) infections: Secondary | ICD-10-CM | POA: Insufficient documentation

## 2014-11-25 DIAGNOSIS — Z91040 Latex allergy status: Secondary | ICD-10-CM | POA: Insufficient documentation

## 2014-11-25 DIAGNOSIS — Z20828 Contact with and (suspected) exposure to other viral communicable diseases: Secondary | ICD-10-CM | POA: Insufficient documentation

## 2014-11-25 DIAGNOSIS — J069 Acute upper respiratory infection, unspecified: Secondary | ICD-10-CM | POA: Insufficient documentation

## 2014-11-25 DIAGNOSIS — J018 Other acute sinusitis: Secondary | ICD-10-CM | POA: Insufficient documentation

## 2014-11-25 DIAGNOSIS — Z3202 Encounter for pregnancy test, result negative: Secondary | ICD-10-CM | POA: Insufficient documentation

## 2014-11-25 HISTORY — DX: Urinary tract infection, site not specified: N39.0

## 2014-11-25 LAB — URINALYSIS, ROUTINE W REFLEX MICROSCOPIC
Bilirubin Urine: NEGATIVE
Glucose, UA: NEGATIVE mg/dL
HGB URINE DIPSTICK: NEGATIVE
Ketones, ur: NEGATIVE mg/dL
Leukocytes, UA: NEGATIVE
NITRITE: NEGATIVE
Protein, ur: NEGATIVE mg/dL
SPECIFIC GRAVITY, URINE: 1.02 (ref 1.005–1.030)
UROBILINOGEN UA: 0.2 mg/dL (ref 0.0–1.0)
pH: 6.5 (ref 5.0–8.0)

## 2014-11-25 LAB — CBG MONITORING, ED: Glucose-Capillary: 89 mg/dL (ref 70–99)

## 2014-11-25 LAB — PREGNANCY, URINE: Preg Test, Ur: NEGATIVE

## 2014-11-25 MED ORDER — IBUPROFEN 800 MG PO TABS: 800.0000 mg | ORAL_TABLET | Freq: Three times a day (TID) | ORAL | Status: DC

## 2014-11-25 MED ORDER — LORATADINE-PSEUDOEPHEDRINE ER 5-120 MG PO TB12: 1.0000 | ORAL_TABLET | Freq: Two times a day (BID) | ORAL | Status: DC

## 2014-11-25 MED ORDER — PROMETHAZINE-CODEINE 6.25-10 MG/5ML PO SYRP: 5.0000 mL | ORAL_SOLUTION | Freq: Four times a day (QID) | ORAL | Status: DC | PRN

## 2014-11-25 NOTE — Discharge Instructions (Signed)
Upper Respiratory Infection, Adult An upper respiratory infection (URI) is also known as the common cold. It is often caused by a type of germ (virus). Colds are easily spread (contagious). You can pass it to others by kissing, coughing, sneezing, or drinking out of the same glass. Usually, you get better in 1 or 2 weeks.  HOME CARE   Only take medicine as told by your doctor.  Use a warm mist humidifier or breathe in steam from a hot shower.  Drink enough water and fluids to keep your pee (urine) clear or pale yellow.  Get plenty of rest.  Return to work when your temperature is back to normal or as told by your doctor. You may use a face mask and wash your hands to stop your cold from spreading. GET HELP RIGHT AWAY IF:   After the first few days, you feel you are getting worse.  You have questions about your medicine.  You have chills, shortness of breath, or brown or red spit (mucus).  You have yellow or brown snot (nasal discharge) or pain in the face, especially when you bend forward.  You have a fever, puffy (swollen) neck, pain when you swallow, or white spots in the back of your throat.  You have a bad headache, ear pain, sinus pain, or chest pain.  You have a high-pitched whistling sound when you breathe in and out (wheezing).  You have a lasting cough or cough up blood.  You have sore muscles or a stiff neck. MAKE SURE YOU:   Understand these instructions.  Will watch your condition.  Will get help right away if you are not doing well or get worse. Document Released: 02/29/2008 Document Revised: 12/05/2011 Document Reviewed: 12/18/2013 [employer] Patient Information 2015 [employer]. This information is not intended to replace advice given to you by your health care provider. Make sure you discuss any questions you have with your health care provider.  

## 2014-11-25 NOTE — ED Notes (Addendum)
PT c/o sinus pressure with yellow drainage x1 week. PT also c/o generalized body aches. Last tylenol was last night. PT c/o recent UTI.

## 2014-11-25 NOTE — ED Notes (Signed)
Patient c/o generalized body aches intermittently. Unsure of any fevers but states "I'm cold but sweating at the same time." Patient reports extreme thirst with frequent urination. Denies any pain with urination. No hx of high blood sugar. Per patient has happened before, "twice last year," and was diagnosed with kidney infections.

## 2014-11-25 NOTE — ED Provider Notes (Signed)
CSN: [id number]     Arrival date & time 11/25/14  1245 History   First MD Initiated Contact with Patient 11/25/14 1411     Chief Complaint  Patient presents with  . Sinusitis     (Consider location/radiation/quality/duration/timing/severity/associated sxs/prior Treatment) Patient is a 24 y.o. female presenting with sinusitis. The history is provided by the patient.  Sinusitis Pain details:    Location:  Frontal   Quality:  Aching   Severity:  Moderate   Duration:  1 week   Timing:  Intermittent Progression:  Worsening Chronicity:  New Relieved by:  Nothing Worsened by:  Nothing tried Ineffective treatments:  None tried Associated symptoms: chills, cough, fatigue, nausea and rhinorrhea   Associated symptoms: no chest pain, no shortness of breath and no wheezing   Risk factors: no diabetes and no immune deficiency     Past Medical History  Diagnosis Date  . 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 Headaches, cluster   . Premenstrual dysphoric disorder   . UTI (lower urinary tract infection)    History reviewed. No pertinent past surgical history. No family history on file. History  Substance Use Topics  . Smoking status: Former Smoker -- 0.03 packs/day for 3 years    Types: Cigarettes    Quit date: 11/22/2014  . Smokeless tobacco: Never Used  . Alcohol Use: Yes     Comment: "occasionally"   OB History    Gravida Para Term Preterm AB TAB SAB Ectopic Multiple Living            0     Review of Systems  Constitutional: Positive for chills and fatigue. Negative for activity change.       All ROS Neg except as noted in HPI  HENT: Positive for rhinorrhea. Negative for nosebleeds.   Eyes: Negative for photophobia and discharge.  Respiratory: Positive for cough. Negative for shortness of breath and wheezing.   Cardiovascular: Negative for chest pain and palpitations.  Gastrointestinal: Positive for nausea. Negative for abdominal pain and blood in stool.  Genitourinary: Positive for frequency  and flank pain. Negative for dysuria and hematuria.  Musculoskeletal: Negative for back pain and arthralgias.  Skin: Negative.   Neurological: Negative for dizziness, seizures and speech difficulty.  Psychiatric/Behavioral: Negative for hallucinations and confusion.      Allergies  Latex  Home Medications   Prior to Admission medications   Medication Sig Start Date End Date Taking? Authorizing Provider  cephALEXin (KEFLEX) 500 MG capsule Take 1 capsule (500 mg total) by mouth 4 (four) times daily. Patient not taking: Reported on 11/25/2014 07/22/14   Benny Lennert, MD  promethazine (PHENERGAN) 12.5 MG tablet Take 1 tablet (12.5 mg total) by mouth every 6 (six) hours as needed for nausea or vomiting. Patient not taking: Reported on 11/25/2014 06/09/14   Janne Napoleon, NP  traMADol (ULTRAM) 50 MG tablet Take 1 tablet (50 mg total) by mouth every 6 (six) hours as needed. Patient not taking: Reported on 11/25/2014 07/22/14   Benny Lennert, MD   BP 147/85 mmHg  Pulse 103  Temp(Src) 99.6 F (37.6 C) (Oral)  Resp 18  Ht  (1.575 m)  Wt 169 lb (76.658 kg)  BMI 30.90 kg/m2  SpO2 100%  LMP 11/05/2014 Physical Exam  Constitutional: She is oriented to person, place, and time. She appears well-developed and well-nourished.  Non-toxic appearance.  HENT:  Head: Normocephalic.  Right Ear: Tympanic membrane and external ear normal.  Left Ear: Tympanic membrane and external  ear normal.  Nasal congestion present. Mild pain to frontal sinuses  Eyes: EOM and lids are normal. Pupils are equal, round, and reactive to light.  Neck: Normal range of motion. Neck supple. Carotid bruit is not present.  Cardiovascular: Normal rate, regular rhythm, normal heart sounds, intact distal pulses and normal pulses.   Pulmonary/Chest: Breath sounds normal. No respiratory distress. She has no wheezes.  Abdominal: Soft. Bowel sounds are normal. There is no tenderness. There is no guarding.  Musculoskeletal:  Normal range of motion.  Lymphadenopathy:       Head (right side): No submandibular adenopathy present.       Head (left side): No submandibular adenopathy present.    She has no cervical adenopathy.  Neurological: She is alert and oriented to person, place, and time. She has normal strength. No cranial nerve deficit or sensory deficit. She exhibits normal muscle tone. Coordination normal.  Skin: Skin is warm and dry.  Psychiatric: She has a normal mood and affect. Her speech is normal.  Nursing note and vitals reviewed.   ED Course  Procedures (including critical care time) Labs Review Labs Reviewed  URINALYSIS, ROUTINE W REFLEX MICROSCOPIC  PREGNANCY, URINE    Imaging Review No results found.   EKG Interpretation None      MDM  Urine pregnancy test is negative. Urinalysis is negative for urinary tract infection. There is no suggestion of kidney stone. Vital signs show the pulse rate being elevated at 103, otherwise the vital signs are well within normal limits. The examination supports upper respiratory infection and sinusitis.  The plan at this time is for the patient to be treated with Claritin-D, ibuprofen 800 mg, and Tylenol codeine for cough and body aches. Patient is been advised to use a mask, and to increase fluids.    Final diagnoses:  None    *I have reviewed nursing notes, vital signs, and all appropriate lab and imaging results for this patient.    Kathie DikeHobson M Tiajuana Leppanen, PA-C 11/25/14 1434  Flint MelterElliott L Wentz, MD 11/25/14 1600

## 2014-11-25 NOTE — ED Notes (Signed)
CBG done in triage 89-value did not cross over in computer. EDPA made aware.

## 2016-07-25 ENCOUNTER — Other Ambulatory Visit: Payer: Self-pay | Admitting: Obstetrics and Gynecology

## 2016-07-25 DIAGNOSIS — O3680X0 Pregnancy with inconclusive fetal viability, not applicable or unspecified: Secondary | ICD-10-CM

## 2016-07-26 ENCOUNTER — Ambulatory Visit (INDEPENDENT_AMBULATORY_CARE_PROVIDER_SITE_OTHER): Payer: Medicaid Other

## 2016-07-26 DIAGNOSIS — Z3A09 9 weeks gestation of pregnancy: Secondary | ICD-10-CM

## 2016-07-26 DIAGNOSIS — O3680X0 Pregnancy with inconclusive fetal viability, not applicable or unspecified: Secondary | ICD-10-CM | POA: Diagnosis not present

## 2016-07-26 DIAGNOSIS — O3491 Maternal care for abnormality of pelvic organ, unspecified, first trimester: Secondary | ICD-10-CM | POA: Diagnosis not present

## 2016-07-26 NOTE — Progress Notes (Signed)
US 8+6 wks,single IUP w/ys,pos fht 178 bpm,normal lt ov,simple right corpus luteal cyst 4.1 x 3.9 x 2.9 cm,

## 2016-08-08 ENCOUNTER — Encounter: Payer: Self-pay | Admitting: Obstetrics & Gynecology

## 2016-08-09 ENCOUNTER — Ambulatory Visit (INDEPENDENT_AMBULATORY_CARE_PROVIDER_SITE_OTHER): Payer: Medicaid Other | Admitting: Advanced Practice Midwife

## 2016-08-09 ENCOUNTER — Encounter: Payer: Self-pay | Admitting: Advanced Practice Midwife

## 2016-08-09 VITALS — BP 108/78 | HR 78 | Wt 145.0 lb

## 2016-08-09 DIAGNOSIS — Z331 Pregnant state, incidental: Secondary | ICD-10-CM

## 2016-08-09 DIAGNOSIS — Z118 Encounter for screening for other infectious and parasitic diseases: Secondary | ICD-10-CM

## 2016-08-09 DIAGNOSIS — Z3401 Encounter for supervision of normal first pregnancy, first trimester: Secondary | ICD-10-CM

## 2016-08-09 DIAGNOSIS — Z23 Encounter for immunization: Secondary | ICD-10-CM

## 2016-08-09 DIAGNOSIS — Z1389 Encounter for screening for other disorder: Secondary | ICD-10-CM

## 2016-08-09 DIAGNOSIS — Z3A11 11 weeks gestation of pregnancy: Secondary | ICD-10-CM

## 2016-08-09 DIAGNOSIS — Z3682 Encounter for antenatal screening for nuchal translucency: Secondary | ICD-10-CM

## 2016-08-09 DIAGNOSIS — Z0283 Encounter for blood-alcohol and blood-drug test: Secondary | ICD-10-CM

## 2016-08-09 DIAGNOSIS — Z1159 Encounter for screening for other viral diseases: Secondary | ICD-10-CM

## 2016-08-09 DIAGNOSIS — Z3400 Encounter for supervision of normal first pregnancy, unspecified trimester: Secondary | ICD-10-CM

## 2016-08-09 LAB — POCT URINALYSIS DIPSTICK
Glucose, UA: NEGATIVE
Leukocytes, UA: NEGATIVE
Nitrite, UA: NEGATIVE
Protein, UA: NEGATIVE
RBC UA: NEGATIVE

## 2016-08-09 NOTE — Progress Notes (Signed)
  Subjective:    Bethany Gomez is a G1P0 242w6d being seen today for her first obstetrical visit.  Her obstetrical history is significant for first baby.  Pregnancy history fully reviewed.  Patient reports no complaints.  Vitals:   08/09/16 1359  BP: 108/78  Pulse: 78  Weight: 145 lb (65.8 kg)    HISTORY: OB History  Gravida Para Term Preterm AB Living  1         0  SAB TAB Ectopic Multiple Live Births               # Outcome Date GA Lbr Len/2nd Weight Sex Delivery Anes PTL Lv  1 Current              Past Medical History:  Diagnosis Date  . Headaches, cluster   . 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 Premenstrual dysphoric disorder   . UTI (lower urinary tract infection)    Past Surgical History:  Procedure Laterality Date  . NO PAST SURGERIES     Family History  Problem Relation Age of Onset  . Diabetes Mother   . Fibromyalgia Mother   . Hypertension Mother   . Congestive Heart Failure Father   . Narcolepsy Father   . Aneurysm Father   . Congestive Heart Failure Paternal Uncle   . Diabetes Maternal Grandmother   . Congestive Heart Failure Maternal Grandmother   . Diabetes Maternal Grandfather   . Congestive Heart Failure Maternal Grandfather   . Diabetes Paternal Grandmother   . Diabetes Paternal Grandfather   . Congestive Heart Failure Paternal Grandfather      Exam                                           Skin: normal coloration and turgor, no rashes    Neurologic: oriented, normal, normal mood   Extremities: normal strength, tone, and muscle mass   HEENT PERRLA   Mouth/Teeth mucous membranes moist, normal dentition   Neck supple and no masses   Cardiovascular: regular rate and rhythm   Respiratory:  appears well, vitals normal, no respiratory distress, acyanotic   Abdomen: soft, non-tender;  FHR: 160 US          Assessment:    Pregnancy: G1P0 Patient Active Problem List   Diagnosis Date Noted  . Supervision of normal first pregnancy 08/09/2016         Plan:     Initial labs drawn. Continue prenatal vitamins  Problem list reviewed and updated  Reviewed n/v relief measures and warning s/s to report  Reviewed recommended weight gain based on pre-gravid BMI  Encouraged well-balanced diet Genetic Screening discussed Integrated Screen: requested.  Ultrasound discussed; fetal survey: requested.  Return in about 2 weeks (around 08/23/2016) for US:NT+1st IT, LROB.  CRESENZO-DISHMAN,[place] 08/09/2016

## 2016-08-09 NOTE — Patient Instructions (Signed)
 First Trimester of Pregnancy The first trimester of pregnancy is from week 1 until the end of week 12 (months 1 through 3). A week after a sperm fertilizes an egg, the egg will implant on the wall of the uterus. This embryo will begin to develop into a baby. Genes from you and your partner are forming the baby. The female genes determine whether the baby is a boy or a girl. At 6-8 weeks, the eyes and face are formed, and the heartbeat can be seen on ultrasound. At the end of 12 weeks, all the baby's organs are formed.  Now that you are pregnant, you will want to do everything you can to have a healthy baby. Two of the most important things are to get good prenatal care and to follow your health care provider's instructions. Prenatal care is all the medical care you receive before the baby's birth. This care will help prevent, find, and treat any problems during the pregnancy and childbirth. BODY CHANGES Your body goes through many changes during pregnancy. The changes vary from woman to woman.   You may gain or lose a couple of pounds at first.  You may feel sick to your stomach (nauseous) and throw up (vomit). If the vomiting is uncontrollable, call your health care provider.  You may tire easily.  You may develop headaches that can be relieved by medicines approved by your health care provider.  You may urinate more often. Painful urination may mean you have a bladder infection.  You may develop heartburn as a result of your pregnancy.  You may develop constipation because certain hormones are causing the muscles that push waste through your intestines to slow down.  You may develop hemorrhoids or swollen, bulging veins (varicose veins).  Your breasts may begin to grow larger and become tender. Your nipples may stick out more, and the tissue that surrounds them (areola) may become darker.  Your gums may bleed and may be sensitive to brushing and flossing.  Dark spots or blotches  (chloasma, mask of pregnancy) may develop on your face. This will likely fade after the baby is born.  Your menstrual periods will stop.  You may have a loss of appetite.  You may develop cravings for certain kinds of food.  You may have changes in your emotions from day to day, such as being excited to be pregnant or being concerned that something may go wrong with the pregnancy and baby.  You may have more vivid and strange dreams.  You may have changes in your hair. These can include thickening of your hair, rapid growth, and changes in texture. Some women also have hair loss during or after pregnancy, or hair that feels dry or thin. Your hair will most likely return to normal after your baby is born. WHAT TO EXPECT AT YOUR PRENATAL VISITS During a routine prenatal visit:  You will be weighed to make sure you and the baby are growing normally.  Your blood pressure will be taken.  Your abdomen will be measured to track your baby's growth.  The fetal heartbeat will be listened to starting around week 10 or 12 of your pregnancy.  Test results from any previous visits will be discussed. Your health care provider may ask you:  How you are feeling.  If you are feeling the baby move.  If you have had any abnormal symptoms, such as leaking fluid, bleeding, severe headaches, or abdominal cramping.  If you have any questions. Other   tests that may be performed during your first trimester include:  Blood tests to find your blood type and to check for the presence of any previous infections. They will also be used to check for low iron levels (anemia) and Rh antibodies. Later in the pregnancy, blood tests for diabetes will be done along with other tests if problems develop.  Urine tests to check for infections, diabetes, or protein in the urine.  An ultrasound to confirm the proper growth and development of the baby.  An amniocentesis to check for possible genetic problems.  Fetal  screens for spina bifida and Down syndrome.  You may need other tests to make sure you and the baby are doing well. HOME CARE INSTRUCTIONS  Medicines  Follow your health care provider's instructions regarding medicine use. Specific medicines may be either safe or unsafe to take during pregnancy.  Take your prenatal vitamins as directed.  If you develop constipation, try taking a stool softener if your health care provider approves. Diet  Eat regular, well-balanced meals. Choose a variety of foods, such as meat or vegetable-based protein, fish, milk and low-fat dairy products, vegetables, fruits, and whole grain breads and cereals. Your health care provider will help you determine the amount of weight gain that is right for you.  Avoid raw meat and uncooked cheese. These carry germs that can cause birth defects in the baby.  Eating four or five small meals rather than three large meals a day may help relieve nausea and vomiting. If you start to feel nauseous, eating a few soda crackers can be helpful. Drinking liquids between meals instead of during meals also seems to help nausea and vomiting.  If you develop constipation, eat more high-fiber foods, such as fresh vegetables or fruit and whole grains. Drink enough fluids to keep your urine clear or pale yellow. Activity and Exercise  Exercise only as directed by your health care provider. Exercising will help you:  Control your weight.  Stay in shape.  Be prepared for labor and delivery.  Experiencing pain or cramping in the lower abdomen or low back is a good sign that you should stop exercising. Check with your health care provider before continuing normal exercises.  Try to avoid standing for long periods of time. Move your legs often if you must stand in one place for a long time.  Avoid heavy lifting.  Wear low-heeled shoes, and practice good posture.  You may continue to have sex unless your health care provider directs you  otherwise. Relief of Pain or Discomfort  Wear a good support bra for breast tenderness.   Take warm sitz baths to soothe any pain or discomfort caused by hemorrhoids. Use hemorrhoid cream if your health care provider approves.   Rest with your legs elevated if you have leg cramps or low back pain.  If you develop varicose veins in your legs, wear support hose. Elevate your feet for 15 minutes, 3-4 times a day. Limit salt in your diet. Prenatal Care  Schedule your prenatal visits by the twelfth week of pregnancy. They are usually scheduled monthly at first, then more often in the last 2 months before delivery.  Write down your questions. Take them to your prenatal visits.  Keep all your prenatal visits as directed by your health care provider. Safety  Wear your seat belt at all times when driving.  Make a list of emergency phone numbers, including numbers for family, friends, the hospital, and police and fire departments. General   Tips  Ask your health care provider for a referral to a local prenatal education class. Begin classes no later than at the beginning of month 6 of your pregnancy.  Ask for help if you have counseling or nutritional needs during pregnancy. Your health care provider can offer advice or refer you to specialists for help with various needs.  Do not use hot tubs, steam rooms, or saunas.  Do not douche or use tampons or scented sanitary pads.  Do not cross your legs for long periods of time.  Avoid cat litter boxes and soil used by cats. These carry germs that can cause birth defects in the baby and possibly loss of the fetus by miscarriage or stillbirth.  Avoid all smoking, herbs, alcohol, and medicines not prescribed by your health care provider. Chemicals in these affect the formation and growth of the baby.  Schedule a dentist appointment. At home, brush your teeth with a soft toothbrush and be gentle when you floss. SEEK MEDICAL CARE IF:   You have  dizziness.  You have mild pelvic cramps, pelvic pressure, or nagging pain in the abdominal area.  You have persistent nausea, vomiting, or diarrhea.  You have a bad smelling vaginal discharge.  You have pain with urination.  You notice increased swelling in your face, hands, legs, or ankles. SEEK IMMEDIATE MEDICAL CARE IF:   You have a fever.  You are leaking fluid from your vagina.  You have spotting or bleeding from your vagina.  You have severe abdominal cramping or pain.  You have rapid weight gain or loss.  You vomit blood or material that looks like coffee grounds.  You are exposed to German measles and have never had them.  You are exposed to fifth disease or chickenpox.  You develop a severe headache.  You have shortness of breath.  You have any kind of trauma, such as from a fall or a car accident. Document Released: 09/06/2001 Document Revised: 01/27/2014 Document Reviewed: 07/23/2013 [employer] Patient Information 2015 [employer]. This information is not intended to replace advice given to you by your health care provider. Make sure you discuss any questions you have with your health care provider.   Nausea & Vomiting  Have saltine crackers or pretzels by your bed and eat a few bites before you raise your head out of bed in the morning  Eat small frequent meals throughout the day instead of large meals  Drink plenty of fluids throughout the day to stay hydrated, just don't drink a lot of fluids with your meals.  This can make your stomach fill up faster making you feel sick  Do not brush your teeth right after you eat  Products with real ginger are good for nausea, like ginger ale and ginger hard candy Make sure it says made with real ginger!  Sucking on sour candy like lemon heads is also good for nausea  If your prenatal vitamins make you nauseated, take them at night so you will sleep through the nausea  Sea Bands  If you feel like you need  medicine for the nausea & vomiting please let us know  If you are unable to keep any fluids or food down please let us know   Constipation  Drink plenty of fluid, preferably water, throughout the day  Eat foods high in fiber such as fruits, vegetables, and grains  Exercise, such as walking, is a good way to keep your bowels regular  Drink warm fluids, especially warm   prune juice, or decaf coffee  Eat a 1/2 cup of real oatmeal (not instant), 1/2 cup applesauce, and 1/2-1 cup warm prune juice every day  If needed, you may take Colace (docusate sodium) stool softener once or twice a day to help keep the stool soft. If you are pregnant, wait until you are out of your first trimester (12-14 weeks of pregnancy)  If you still are having problems with constipation, you may take Miralax once daily as needed to help keep your bowels regular.  If you are pregnant, wait until you are out of your first trimester (12-14 weeks of pregnancy)  Safe Medications in Pregnancy   Acne: Benzoyl Peroxide Salicylic Acid  Backache/Headache: Tylenol: 2 regular strength every 4 hours OR              2 Extra strength every 6 hours  Colds/Coughs/Allergies: Benadryl (alcohol free) 25 mg every 6 hours as needed Breath right strips Claritin Cepacol throat lozenges Chloraseptic throat spray Cold-Eeze- up to three times per day Cough drops, alcohol free Flonase (by prescription only) Guaifenesin Mucinex Robitussin DM (plain only, alcohol free) Saline nasal spray/drops Sudafed (pseudoephedrine) & Actifed ** use only after [redacted] weeks gestation and if you do not have high blood pressure Tylenol Vicks Vaporub Zinc lozenges Zyrtec   Constipation: Colace Ducolax suppositories Fleet enema Glycerin suppositories Metamucil Milk of magnesia Miralax Senokot Smooth move tea  Diarrhea: Kaopectate Imodium A-D  *NO pepto Bismol  Hemorrhoids: Anusol Anusol HC Preparation  H Tucks  Indigestion: Tums Maalox Mylanta Zantac  Pepcid  Insomnia: Benadryl (alcohol free) 25mg every 6 hours as needed Tylenol PM Unisom, no Gelcaps  Leg Cramps: Tums MagGel  Nausea/Vomiting:  Bonine Dramamine Emetrol Ginger extract Sea bands Meclizine  Nausea medication to take during pregnancy:  Unisom (doxylamine succinate 25 mg tablets) Take one tablet daily at bedtime. If symptoms are not adequately controlled, the dose can be increased to a maximum recommended dose of two tablets daily (1/2 tablet in the morning, 1/2 tablet mid-afternoon and one at bedtime). Vitamin B6 100mg tablets. Take one tablet twice a day (up to 200 mg per day).  Skin Rashes: Aveeno products Benadryl cream or 25mg every 6 hours as needed Calamine Lotion 1% cortisone cream  Yeast infection: Gyne-lotrimin 7 Monistat 7   **If taking multiple medications, please check labels to avoid duplicating the same active ingredients **take medication as directed on the label ** Do not exceed 4000 mg of tylenol in 24 hours **Do not take medications that contain aspirin or ibuprofen      

## 2016-08-10 LAB — SICKLE CELL SCREEN: Sickle Cell Screen: NEGATIVE

## 2016-08-10 LAB — URINALYSIS, ROUTINE W REFLEX MICROSCOPIC
Bilirubin, UA: NEGATIVE
Glucose, UA: NEGATIVE
LEUKOCYTES UA: NEGATIVE
Nitrite, UA: NEGATIVE
Protein, UA: NEGATIVE
RBC, UA: NEGATIVE
SPEC GRAV UA: 1.028 (ref 1.005–1.030)
Urobilinogen, Ur: 1 mg/dL (ref 0.2–1.0)
pH, UA: 6.5 (ref 5.0–7.5)

## 2016-08-10 LAB — GC/CHLAMYDIA PROBE AMP
Chlamydia trachomatis, NAA: NEGATIVE
NEISSERIA GONORRHOEAE BY PCR: NEGATIVE

## 2016-08-10 LAB — CBC
Hematocrit: 38.5 % (ref 34.0–46.6)
Hemoglobin: 12.6 g/dL (ref 11.1–15.9)
MCH: 27.2 pg (ref 26.6–33.0)
MCHC: 32.7 g/dL (ref 31.5–35.7)
MCV: 83 fL (ref 79–97)
PLATELETS: 349 10*3/uL (ref 150–379)
RBC: 4.64 x10E6/uL (ref 3.77–5.28)
RDW: 13.8 % (ref 12.3–15.4)
WBC: 8.3 10*3/uL (ref 3.4–10.8)

## 2016-08-10 LAB — PMP SCREEN PROFILE (10S), URINE
Amphetamine Screen, Ur: NEGATIVE ng/mL
Barbiturate Screen, Ur: NEGATIVE ng/mL
Benzodiazepine Screen, Urine: NEGATIVE ng/mL
Cannabinoids Ur Ql Scn: POSITIVE ng/mL
Cocaine(Metab.)Screen, Urine: NEGATIVE ng/mL
Creatinine(Crt), U: 228.3 mg/dL (ref 20.0–300.0)
Methadone Scn, Ur: NEGATIVE ng/mL
Opiate Scrn, Ur: NEGATIVE ng/mL
Oxycodone+Oxymorphone Ur Ql Scn: NEGATIVE ng/mL
PCP SCRN UR: NEGATIVE ng/mL
PH UR, DRUG SCRN: 6 (ref 4.5–8.9)
PROPOXYPHENE SCREEN: NEGATIVE ng/mL

## 2016-08-10 LAB — ANTIBODY SCREEN: Antibody Screen: NEGATIVE

## 2016-08-10 LAB — ABO/RH: RH TYPE: POSITIVE

## 2016-08-10 LAB — HIV ANTIBODY (ROUTINE TESTING W REFLEX): HIV Screen 4th Generation wRfx: NONREACTIVE

## 2016-08-10 LAB — HEPATITIS B SURFACE ANTIGEN: HEP B S AG: NEGATIVE

## 2016-08-10 LAB — VARICELLA ZOSTER ANTIBODY, IGG: Varicella zoster IgG: 1468 index (ref >165)

## 2016-08-10 LAB — RUBELLA SCREEN: Rubella Antibodies, IGG: 7.16 index (ref >0.99)

## 2016-08-10 LAB — RPR: RPR Ser Ql: NONREACTIVE

## 2016-08-11 LAB — URINE CULTURE

## 2016-08-23 ENCOUNTER — Ambulatory Visit (INDEPENDENT_AMBULATORY_CARE_PROVIDER_SITE_OTHER): Payer: Medicaid Other | Admitting: Obstetrics & Gynecology

## 2016-08-23 ENCOUNTER — Encounter: Payer: Self-pay | Admitting: Obstetrics & Gynecology

## 2016-08-23 ENCOUNTER — Ambulatory Visit (INDEPENDENT_AMBULATORY_CARE_PROVIDER_SITE_OTHER): Payer: Medicaid Other

## 2016-08-23 VITALS — BP 120/80 | HR 80 | Wt 149.0 lb

## 2016-08-23 DIAGNOSIS — Z3A13 13 weeks gestation of pregnancy: Secondary | ICD-10-CM

## 2016-08-23 DIAGNOSIS — O3491 Maternal care for abnormality of pelvic organ, unspecified, first trimester: Secondary | ICD-10-CM

## 2016-08-23 DIAGNOSIS — Z3682 Encounter for antenatal screening for nuchal translucency: Secondary | ICD-10-CM | POA: Diagnosis not present

## 2016-08-23 DIAGNOSIS — Z3402 Encounter for supervision of normal first pregnancy, second trimester: Secondary | ICD-10-CM

## 2016-08-23 DIAGNOSIS — Z1389 Encounter for screening for other disorder: Secondary | ICD-10-CM

## 2016-08-23 DIAGNOSIS — Z3401 Encounter for supervision of normal first pregnancy, first trimester: Secondary | ICD-10-CM

## 2016-08-23 DIAGNOSIS — Z331 Pregnant state, incidental: Secondary | ICD-10-CM

## 2016-08-23 LAB — POCT URINALYSIS DIPSTICK
Blood, UA: NEGATIVE
GLUCOSE UA: NEGATIVE
KETONES UA: NEGATIVE
Leukocytes, UA: NEGATIVE
Nitrite, UA: NEGATIVE
PROTEIN UA: 1

## 2016-08-23 NOTE — Progress Notes (Signed)
US 12+6 wks,measurements c/w dates,crl 63.3 mm,NB present,NT 1.6 mm,normal lt ov,simple corpus luteal cyst right ov 4.3 x 2.1 x 4 cm,fhr 169 bpm.

## 2016-08-23 NOTE — Progress Notes (Signed)
G1P0 56105w6d Estimated Date of Delivery: 03/01/17  Blood pressure 120/80, pulse 80, weight 149 lb (67.6 kg), last menstrual period 05/25/2016.   BP weight and urine results all reviewed and noted.  Please refer to the obstetrical flow sheet for the fundal height and fetal heart rate documentation:  Patient reports good fetal movement, denies any bleeding and no rupture of membranes symptoms or regular contractions. Patient is without complaints. All questions were answered.  Orders Placed This Encounter  Procedures  . Maternal Screen, Integrated #1  . POCT urinalysis dipstick    Plan:  Continued routine obstetrical care, NT sonogram is normal  Return in about 4 weeks (around 09/20/2016) for LROB.

## 2016-08-26 LAB — MATERNAL SCREEN, INTEGRATED #1
Crown Rump Length: 63.3 mm
Gest. Age on Collection Date: 12.6 weeks
Maternal Age at EDD: 24.8 years
Nuchal Translucency (NT): 1.6 mm
Number of Fetuses: 1
PAPP-A Value: 690.8 ng/mL
Weight: 149 [lb_av]

## 2016-09-21 ENCOUNTER — Ambulatory Visit (INDEPENDENT_AMBULATORY_CARE_PROVIDER_SITE_OTHER): Payer: Medicaid Other | Admitting: Obstetrics and Gynecology

## 2016-09-21 ENCOUNTER — Encounter: Payer: Self-pay | Admitting: Obstetrics and Gynecology

## 2016-09-21 VITALS — BP 144/83 | HR 98 | Wt 149.8 lb

## 2016-09-21 DIAGNOSIS — Z3402 Encounter for supervision of normal first pregnancy, second trimester: Secondary | ICD-10-CM

## 2016-09-21 DIAGNOSIS — R030 Elevated blood-pressure reading, without diagnosis of hypertension: Secondary | ICD-10-CM

## 2016-09-21 DIAGNOSIS — Z3682 Encounter for antenatal screening for nuchal translucency: Secondary | ICD-10-CM

## 2016-09-21 DIAGNOSIS — Z3A17 17 weeks gestation of pregnancy: Secondary | ICD-10-CM

## 2016-09-21 DIAGNOSIS — Z331 Pregnant state, incidental: Secondary | ICD-10-CM

## 2016-09-21 DIAGNOSIS — Z1389 Encounter for screening for other disorder: Secondary | ICD-10-CM

## 2016-09-21 LAB — POCT URINALYSIS DIPSTICK
Blood, UA: NEGATIVE
GLUCOSE UA: NEGATIVE
Leukocytes, UA: NEGATIVE
Nitrite, UA: NEGATIVE

## 2016-09-21 NOTE — Progress Notes (Signed)
Patient ID: Bethany Gomez, female   DOB: 1990/12/23, 25 y.o.   MRN: [id number]  G1P0  Estimated Date of Delivery: 03/01/17 LROB 6731w0d  Blood pressure (!) 144/83, pulse 98, weight 149 lb 12.8 oz (67.9 kg), last menstrual period 05/25/2016.    Urine results: notable for moderate ketones, trace protein  Chief Complaint  Patient presents with  . Routine Prenatal Visit    Patient complaints: none. Pt expresses interest in waterbirth. Partner has 2 other children by prior relaitonship  Patient reports  Too early for fetal movement. She denies any bleeding, rupture of membranes, or regular contractions.   Refer to the ob flow sheet for FH and FHR.    Physical Examination: General appearance - alert, well appearing, and in no distress                                      Abdomen - FH- 10 cm just below the umbilicus              FHR 160 bpm                                                         soft, nontender, nondistended, no masses or organomegaly                                            Questions were answered. Assessment: LROB G1P0 @ 8131w0d  1. Encouraged pt and FOB to attend childbirth classes together   Plan:  Continued routine obstetrical care 1. Anatomy US in 2 weeks   F/u in 4 weeks for routine prenatal care, 2 weeks for US   By signing my name below, I, Doreatha MartinEva Mathews, attest that this documentation has been prepared under the direction and in the presence of Tilda BurrowJohn V Sarahelizabeth Conway, MD. Electronically Signed: Doreatha MartinEva Mathews, ED Scribe. 09/21/16. 9:32 AM.    I personally performed the services described in this documentation, which was SCRIBED in my presence. The recorded information has been reviewed and considered accurate. It has been edited as necessary during review. Tilda BurrowFERGUSON,Emrys Mckamie V, MD ;

## 2016-09-24 LAB — MATERNAL SCREEN, INTEGRATED #2
ADSF: 0.94
AFP MARKER: 26.2 ng/mL
AFP MoM: 0.7
CROWN RUMP LENGTH: 63.3 mm
DIA MoM: 1.08
DIA Value: 186.6 pg/mL
Estriol, Unconjugated: 0.89 ng/mL
GEST. AGE ON COLLECTION DATE: 12.6 wk
GESTATIONAL AGE: 16.7 wk
HCG VALUE: 20.2 [IU]/mL
MATERNAL AGE AT EDD: 24.8 a
NUMBER OF FETUSES: 1
Nuchal Translucency (NT): 1.6 mm
Nuchal Translucency MoM: 1
PAPP-A MoM: 0.7
PAPP-A Value: 690.8 ng/mL
Test Results:: NEGATIVE
WEIGHT: 150 [lb_av]
Weight: 149 [lb_av]
hCG MoM: 0.68

## 2016-09-26 NOTE — L&D Delivery Note (Signed)
Patient is 26 y.o. G1P1001 6761w2d admitted for induction fo labor for IUGR, hx of FGR <2% with elevated Doppler ratios as well as gestational HTN.   Delivery Note At 6:32 AM a viable female was delivered via Vaginal, Spontaneous Delivery (Presentation: LOP with compound L hand).  APGAR: pending; weight 3 lb 14.4 oz (1770 g).   Placenta status: complete, intact; sent to pathology.  Cord:  3V with the following complications: none.  Cord pH: N/A.  Anesthesia:  Epidural Episiotomy: None Lacerations: None Est. Blood Loss (mL): 200  Mom to postpartum.  Baby to NICU.  Upon arrival patient was complete and pushing. She pushed with good maternal effort to deliver a healthy baby female. Baby delivered without difficulty, was noted to have good tone and place on maternal abdomen for oral suctioning, drying and stimulation. Delayed cord clamping performed. Placenta delivered intact with 3V cord. Vaginal canal and perineum was inspected and no repairs necessary; hemostatic. Pitocin was started and uterus massaged until bleeding slowed. Counts of sharps, instruments, and lap pads were all correct.   Tarri AbernethyAbigail J Deirdra Heumann, MD, MPH PGY-2 [facility] Pager [phone]

## 2016-10-10 ENCOUNTER — Other Ambulatory Visit: Payer: Self-pay | Admitting: Obstetrics and Gynecology

## 2016-10-10 DIAGNOSIS — Z363 Encounter for antenatal screening for malformations: Secondary | ICD-10-CM

## 2016-10-12 ENCOUNTER — Encounter: Payer: Medicaid Other | Admitting: Advanced Practice Midwife

## 2016-10-12 ENCOUNTER — Other Ambulatory Visit: Payer: Medicaid Other

## 2016-10-17 ENCOUNTER — Encounter: Payer: Self-pay | Admitting: Obstetrics and Gynecology

## 2016-10-17 ENCOUNTER — Ambulatory Visit (INDEPENDENT_AMBULATORY_CARE_PROVIDER_SITE_OTHER): Payer: Medicaid Other

## 2016-10-17 ENCOUNTER — Ambulatory Visit (INDEPENDENT_AMBULATORY_CARE_PROVIDER_SITE_OTHER): Payer: Medicaid Other | Admitting: Obstetrics and Gynecology

## 2016-10-17 VITALS — BP 128/79 | HR 87 | Wt 155.2 lb

## 2016-10-17 DIAGNOSIS — Z3A21 21 weeks gestation of pregnancy: Secondary | ICD-10-CM

## 2016-10-17 DIAGNOSIS — Z331 Pregnant state, incidental: Secondary | ICD-10-CM

## 2016-10-17 DIAGNOSIS — Z3402 Encounter for supervision of normal first pregnancy, second trimester: Secondary | ICD-10-CM

## 2016-10-17 DIAGNOSIS — Z363 Encounter for antenatal screening for malformations: Secondary | ICD-10-CM

## 2016-10-17 DIAGNOSIS — Z1389 Encounter for screening for other disorder: Secondary | ICD-10-CM

## 2016-10-17 DIAGNOSIS — O23592 Infection of other part of genital tract in pregnancy, second trimester: Secondary | ICD-10-CM

## 2016-10-17 LAB — POCT URINALYSIS DIPSTICK
GLUCOSE UA: NEGATIVE
Ketones, UA: NEGATIVE
Nitrite, UA: NEGATIVE
Protein, UA: NEGATIVE
RBC UA: NEGATIVE

## 2016-10-17 NOTE — Progress Notes (Signed)
Patient ID: Bethany Gomez, female   DOB: 03/04/1991, 26 y.o.   MRN: [id number]  G1P0  Estimated Date of Delivery: 03/01/17 LROB 5343w5d  Chief Complaint  Patient presents with  . Routine Prenatal Visit    anatomy scan  ____  Patient complaints: Pt notes she has noticed some increased white vaginal discharge. She denies vaginal or vulvar itching.   Patient reports good fetal movement. She denies any bleeding, rupture of membranes,or regular contractions.  Blood pressure 128/79, pulse 87, weight 155 lb 3.2 oz (70.4 kg), last menstrual period 05/25/2016.    Urine results:notable for +1 leukocytes refer to the ob flow sheet for FH and FHR,                         Physical Examination: General appearance - alert, well appearing, and in no distress                                      Abdomen - FH 22 cm, U + 2 cm                                                         -FHR 159 bpm                                                         soft, nontender, nondistended, no masses or organomegaly                                            Questions were answered. Assessment: LROB G1P0 @ 2543w5d  Encouraged pt to attend childbirth classes ([facility]    Plan:  Continued routine obstetrical care  F/u in 4 weeks for routine prenatal care, GTT    By signing my name below, I, Doreatha MartinEva Mathews, attest that this documentation has been prepared under the direction and in the presence of Tilda BurrowJohn Makendra Vigeant V, MD. Electronically Signed: Doreatha MartinEva Mathews, ED Scribe. 10/17/16. 10:20 AM.  I personally performed the services described in this documentation, which was SCRIBED in my presence. The recorded information has been reviewed and considered accurate. It has been edited as necessary during review. Tilda BurrowFERGUSON,Melessia Kaus V, MD

## 2016-10-17 NOTE — Progress Notes (Signed)
US 20+5 wks,cephalic,cx 3 cm,post pl gr 0,simple right corpus luteal cyst 3.7 x 3.2 x 2.6 cm,normal left ovary,svp of fluid 6 cm,fhr 159 bpm,EFW 327 g,anatomy complete,no obvious abnormalities seen

## 2016-11-14 ENCOUNTER — Ambulatory Visit (INDEPENDENT_AMBULATORY_CARE_PROVIDER_SITE_OTHER): Payer: Medicaid Other | Admitting: Women's Health

## 2016-11-14 ENCOUNTER — Encounter: Payer: Self-pay | Admitting: Women's Health

## 2016-11-14 VITALS — BP 132/62 | HR 80 | Wt 163.0 lb

## 2016-11-14 DIAGNOSIS — F32A Depression, unspecified: Secondary | ICD-10-CM | POA: Insufficient documentation

## 2016-11-14 DIAGNOSIS — Z1389 Encounter for screening for other disorder: Secondary | ICD-10-CM

## 2016-11-14 DIAGNOSIS — O99322 Drug use complicating pregnancy, second trimester: Secondary | ICD-10-CM

## 2016-11-14 DIAGNOSIS — Z331 Pregnant state, incidental: Secondary | ICD-10-CM

## 2016-11-14 DIAGNOSIS — Z3A25 25 weeks gestation of pregnancy: Secondary | ICD-10-CM

## 2016-11-14 DIAGNOSIS — F1290 Cannabis use, unspecified, uncomplicated: Secondary | ICD-10-CM | POA: Insufficient documentation

## 2016-11-14 DIAGNOSIS — F329 Major depressive disorder, single episode, unspecified: Secondary | ICD-10-CM | POA: Insufficient documentation

## 2016-11-14 DIAGNOSIS — Z3402 Encounter for supervision of normal first pregnancy, second trimester: Secondary | ICD-10-CM

## 2016-11-14 DIAGNOSIS — O99342 Other mental disorders complicating pregnancy, second trimester: Secondary | ICD-10-CM

## 2016-11-14 LAB — POCT URINALYSIS DIPSTICK
Blood, UA: NEGATIVE
Glucose, UA: NEGATIVE
Ketones, UA: NEGATIVE
Leukocytes, UA: NEGATIVE
Nitrite, UA: NEGATIVE
PROTEIN UA: NEGATIVE

## 2016-11-14 NOTE — Patient Instructions (Signed)
You will have your sugar test next visit.  Please do not eat or drink anything after midnight the night before you come, not even water.  You will be here for at least two hours.     Call the office [phone]) or go to [facility] if:  You begin to have strong, frequent contractions  Your water breaks.  Sometimes it is a big gush of fluid, sometimes it is just a trickle that keeps getting your panties wet or running down your legs  You have vaginal bleeding.  It is normal to have a small amount of spotting if your cervix was checked.   You don't feel your baby moving like normal.  If you don't, get you something to eat and drink and lay down and focus on feeling your baby move.   If your baby is still not moving like normal, you should call the office or go to [place] [facility]:  [place] [phone]                 [place] [phone] (usually not accepting new patients unless you have family there already, you are always welcome to call and ask)            [facility] ([place]) [phone]   [facility] Pediatricians/Family Doctors:   [place]: [phone]  [facility]: [phone]    Second Trimester of Pregnancy The second trimester is from week 13 through week 28, months 4 through 6. The second trimester is often a time when you feel your best. Your body has also adjusted to being pregnant, and you begin to feel better physically. Usually, morning sickness has lessened or quit completely, you may have more energy, and you may have an increase in appetite. The second trimester is also a time when the fetus is growing rapidly. At the end of the sixth month, the fetus is about 9 inches long and weighs about 1 pounds. You will likely begin to feel the baby move (quickening) between 18 and 20  weeks of the pregnancy. BODY CHANGES Your body goes through many changes during pregnancy. The changes vary from woman to woman.   Your weight will continue to increase. You will notice your lower abdomen bulging out.  You may begin to get stretch marks on your hips, abdomen, and breasts.  You may develop headaches that can be relieved by medicines approved by your health care provider.  You may urinate more often because the fetus is pressing on your bladder.  You may develop or continue to have heartburn as a result of your pregnancy.  You may develop constipation because certain hormones are causing the muscles that push waste through your intestines to slow down.  You may develop hemorrhoids or swollen, bulging veins (varicose veins).  You may have back pain because of the weight gain and pregnancy hormones relaxing your joints between the bones in your pelvis and as a result of a shift in weight and the muscles that support your balance.  Your breasts will continue to grow and be tender.  Your gums may bleed and may be sensitive to brushing and flossing.  Dark spots or blotches (chloasma, mask of pregnancy) may develop on your face. This will likely fade after the baby is born.  A dark line from your belly button to the pubic  area (linea nigra) may appear. This will likely fade after the baby is born.  You may have changes in your hair. These can include thickening of your hair, rapid growth, and changes in texture. Some women also have hair loss during or after pregnancy, or hair that feels dry or thin. Your hair will most likely return to normal after your baby is born. WHAT TO EXPECT AT YOUR PRENATAL VISITS During a routine prenatal visit:  You will be weighed to make sure you and the fetus are growing normally.  Your blood pressure will be taken.  Your abdomen will be measured to track your baby's growth.  The fetal heartbeat will be listened to.  Any test results  from the previous visit will be discussed. Your health care provider may ask you:  How you are feeling.  If you are feeling the baby move.  If you have had any abnormal symptoms, such as leaking fluid, bleeding, severe headaches, or abdominal cramping.  If you have any questions. Other tests that may be performed during your second trimester include:  Blood tests that check for:  Low iron levels (anemia).  Gestational diabetes (between 24 and 28 weeks).  Rh antibodies.  Urine tests to check for infections, diabetes, or protein in the urine.  An ultrasound to confirm the proper growth and development of the baby.  An amniocentesis to check for possible genetic problems.  Fetal screens for spina bifida and Down syndrome. HOME CARE INSTRUCTIONS   Avoid all smoking, herbs, alcohol, and unprescribed drugs. These chemicals affect the formation and growth of the baby.  Follow your health care provider's instructions regarding medicine use. There are medicines that are either safe or unsafe to take during pregnancy.  Exercise only as directed by your health care provider. Experiencing uterine cramps is a good sign to stop exercising.  Continue to eat regular, healthy meals.  Wear a good support bra for breast tenderness.  Do not use hot tubs, steam rooms, or saunas.  Wear your seat belt at all times when driving.  Avoid raw meat, uncooked cheese, cat litter boxes, and soil used by cats. These carry germs that can cause birth defects in the baby.  Take your prenatal vitamins.  Try taking a stool softener (if your health care provider approves) if you develop constipation. Eat more high-fiber foods, such as fresh vegetables or fruit and whole grains. Drink plenty of fluids to keep your urine clear or pale yellow.  Take warm sitz baths to soothe any pain or discomfort caused by hemorrhoids. Use hemorrhoid cream if your health care provider approves.  If you develop varicose  veins, wear support hose. Elevate your feet for 15 minutes, 3-4 times a day. Limit salt in your diet.  Avoid heavy lifting, wear low heel shoes, and practice good posture.  Rest with your legs elevated if you have leg cramps or low back pain.  Visit your dentist if you have not gone yet during your pregnancy. Use a soft toothbrush to brush your teeth and be gentle when you floss.  A sexual relationship may be continued unless your health care provider directs you otherwise.  Continue to go to all your prenatal visits as directed by your health care provider. SEEK MEDICAL CARE IF:   You have dizziness.  You have mild pelvic cramps, pelvic pressure, or nagging pain in the abdominal area.  You have persistent nausea, vomiting, or diarrhea.  You have a bad smelling vaginal discharge.  You have  pain with urination. SEEK IMMEDIATE MEDICAL CARE IF:   You have a fever.  You are leaking fluid from your vagina.  You have spotting or bleeding from your vagina.  You have severe abdominal cramping or pain.  You have rapid weight gain or loss.  You have shortness of breath with chest pain.  You notice sudden or extreme swelling of your face, hands, ankles, feet, or legs.  You have not felt your baby move in over an hour.  You have severe headaches that do not go away with medicine.  You have vision changes. Document Released: 09/06/2001 Document Revised: 09/17/2013 Document Reviewed: 11/13/2012 Exit[employer] Patient Information 2015 [employer]. This information is not intended to replace advice given to you by your health care provider. Make sure you discuss any questions you have with your health care provider.     

## 2016-11-14 NOTE — Progress Notes (Signed)
Low-risk OB appointment G1P0 3069w5d Estimated Date of Delivery: 03/01/17 BP 132/62   Pulse 80   Wt 163 lb (73.9 kg)   LMP 05/25/2016 (Exact Date)   BMI 29.81 kg/m   BP, weight, and urine reviewed.  Refer to obstetrical flow sheet for FH & FHR.  Reports good fm.  Denies regular uc's, lof, vb, or uti s/s. Epigastric pain when she lays down- sharp and brief. Does report some reflux/indigestion- sounds like this. Can take tums/antacids.  [facility]- is [facility] herself to make appt. Reports h/o PMDD/depression/anger issues, hasn't been on meds 'in years'. Denies SI/HI.  Reviewed ptl s/s, warning s/s to report. Plan:  Continue routine obstetrical care  F/U in 4wks for OB appointment and pn2

## 2016-12-07 ENCOUNTER — Telehealth: Payer: Self-pay | Admitting: Obstetrics & Gynecology

## 2016-12-07 NOTE — Telephone Encounter (Signed)
Spoke with patient who states she woke up a couple of times feeling like she was choking. Pt sounded congested when talking with her. States she has not had any problems with her sinuses. Advised patient she could take Claritin, Zyrtec, Mucinex to help loosen mucous. Encouraged to push fluids and continue to monitor for fever. Pt to call us back if new or symptoms worsen.

## 2016-12-07 NOTE — Telephone Encounter (Signed)
Pt called stating that she has been feeling sick. Pt states that she has woken up in the middle of the night 3 times nausea and then she woke up to throw up and she felt a little dizzy. Please contact pt

## 2016-12-12 ENCOUNTER — Ambulatory Visit (INDEPENDENT_AMBULATORY_CARE_PROVIDER_SITE_OTHER): Payer: Medicaid Other | Admitting: Women's Health

## 2016-12-12 ENCOUNTER — Encounter: Payer: Self-pay | Admitting: Women's Health

## 2016-12-12 ENCOUNTER — Other Ambulatory Visit: Payer: Medicaid Other

## 2016-12-12 VITALS — BP 110/80 | HR 72 | Wt 169.6 lb

## 2016-12-12 DIAGNOSIS — Z1389 Encounter for screening for other disorder: Secondary | ICD-10-CM

## 2016-12-12 DIAGNOSIS — Z131 Encounter for screening for diabetes mellitus: Secondary | ICD-10-CM

## 2016-12-12 DIAGNOSIS — Z3402 Encounter for supervision of normal first pregnancy, second trimester: Secondary | ICD-10-CM

## 2016-12-12 DIAGNOSIS — Z331 Pregnant state, incidental: Secondary | ICD-10-CM

## 2016-12-12 DIAGNOSIS — O368130 Decreased fetal movements, third trimester, not applicable or unspecified: Secondary | ICD-10-CM | POA: Diagnosis not present

## 2016-12-12 LAB — POCT URINALYSIS DIPSTICK
Blood, UA: NEGATIVE
GLUCOSE UA: NEGATIVE
Ketones, UA: NEGATIVE
Leukocytes, UA: NEGATIVE
Nitrite, UA: NEGATIVE
Protein, UA: NEGATIVE

## 2016-12-12 NOTE — Progress Notes (Signed)
Low-risk OB appointment G1P0 8524w5d Estimated Date of Delivery: 03/01/17 BP 110/80   Pulse 72   Wt 169 lb 9.6 oz (76.9 kg)   LMP 05/25/2016 (Exact Date)   BMI 31.02 kg/m   BP, weight, and urine reviewed.  Refer to obstetrical flow sheet for FH & FHR.  Reports decreased fm.  Denies regular uc's, lof, vb, or uti s/s. Reflux- still hasn't tried taking anything, can take TUMS, otc antacids. Spot on back of Lt thigh that comes and goes since being pregnant, hurts/turns into multiple small bumps, then dries up and goes away. Uses 'fever blister' cream on it and it goes away. Area is healed right now, so difficult to assess- pt to call when it comes back for appt.  NST for decreased fm: reactive/reassuring for GA, occ very mild variable c/w GA Reviewed ptl s/s, fkc. Recommended Tdap at HD/PCP per CDC guidelines.  Plan:  Continue routine obstetrical care  F/U in 4wks for OB appointment  PN2 today

## 2016-12-12 NOTE — Patient Instructions (Signed)

## 2016-12-13 LAB — CBC
Hematocrit: 35.8 % (ref 34.0–46.6)
Hemoglobin: 11.5 g/dL (ref 11.1–15.9)
MCH: 27.9 pg (ref 26.6–33.0)
MCHC: 32.1 g/dL (ref 31.5–35.7)
MCV: 87 fL (ref 79–97)
Platelets: 285 10*3/uL (ref 150–379)
RBC: 4.12 x10E6/uL (ref 3.77–5.28)
RDW: 14.3 % (ref 12.3–15.4)
WBC: 9.5 10*3/uL (ref 3.4–10.8)

## 2016-12-13 LAB — GLUCOSE TOLERANCE, 2 HOURS W/ 1HR
GLUCOSE, FASTING: 79 mg/dL (ref 65–91)
Glucose, 1 hour: 71 mg/dL (ref 65–179)
Glucose, 2 hour: 75 mg/dL (ref 65–152)

## 2016-12-13 LAB — HIV ANTIBODY (ROUTINE TESTING W REFLEX): HIV Screen 4th Generation wRfx: NONREACTIVE

## 2016-12-13 LAB — RPR: RPR Ser Ql: NONREACTIVE

## 2016-12-13 LAB — ANTIBODY SCREEN: Antibody Screen: NEGATIVE

## 2016-12-31 ENCOUNTER — Inpatient Hospital Stay (HOSPITAL_COMMUNITY)
Admission: AD | Admit: 2016-12-31 | Discharge: 2016-12-31 | Disposition: A | Discharge status: Home / Self Care | Payer: Medicaid Other | Source: Ambulatory Visit | Attending: Obstetrics and Gynecology | Admitting: Obstetrics and Gynecology

## 2016-12-31 ENCOUNTER — Encounter (HOSPITAL_COMMUNITY): Payer: Self-pay | Admitting: *Deleted

## 2016-12-31 DIAGNOSIS — O26893 Other specified pregnancy related conditions, third trimester: Secondary | ICD-10-CM | POA: Diagnosis not present

## 2016-12-31 DIAGNOSIS — Z833 Family history of diabetes mellitus: Secondary | ICD-10-CM | POA: Diagnosis not present

## 2016-12-31 DIAGNOSIS — Z3A31 31 weeks gestation of pregnancy: Secondary | ICD-10-CM | POA: Diagnosis not present

## 2016-12-31 DIAGNOSIS — Z888 Allergy status to other drugs, medicaments and biological substances status: Secondary | ICD-10-CM | POA: Diagnosis not present

## 2016-12-31 DIAGNOSIS — Z8489 Family history of other specified conditions: Secondary | ICD-10-CM | POA: Diagnosis not present

## 2016-12-31 DIAGNOSIS — Z8249 Family history of ischemic heart disease and other diseases of the circulatory system: Secondary | ICD-10-CM | POA: Insufficient documentation

## 2016-12-31 DIAGNOSIS — R1030 Lower abdominal pain, unspecified: Secondary | ICD-10-CM | POA: Diagnosis present

## 2016-12-31 DIAGNOSIS — Z87891 Personal history of nicotine dependence: Secondary | ICD-10-CM | POA: Diagnosis not present

## 2016-12-31 LAB — URINALYSIS, ROUTINE W REFLEX MICROSCOPIC
Bilirubin Urine: NEGATIVE
Glucose, UA: NEGATIVE mg/dL
HGB URINE DIPSTICK: NEGATIVE
Ketones, ur: NEGATIVE mg/dL
NITRITE: NEGATIVE
Protein, ur: NEGATIVE mg/dL
Specific Gravity, Urine: 1.025 (ref 1.005–1.030)
pH: 5 (ref 5.0–8.0)

## 2016-12-31 LAB — WET PREP, GENITAL
Clue Cells Wet Prep HPF POC: NONE SEEN
Sperm: NONE SEEN
Trich, Wet Prep: NONE SEEN
Yeast Wet Prep HPF POC: NONE SEEN

## 2016-12-31 NOTE — Discharge Instructions (Signed)

## 2016-12-31 NOTE — MAU Note (Signed)
Patient states she has been feeling abdominal tightness and vaginal pressure for past week.  States "I'm probably dehydrated." Unsure if having contractions, but states feeling vaginal pressure.  Denies vaginal bleeding or discharge, but having some itching.

## 2016-12-31 NOTE — MAU Provider Note (Signed)
Patient Bethany Gomez is a G1P0 at 31 weeks and 3 days who is complaining of vaginal pressure. She endorses positive fetal movements; denies bleeding, leaking of fluid or dysuria, flank pain. She states taht she "feels the baby pressing down" and she came in to get checked out. She also says she doesn't drink enough water.  History     CSN: [id number]  Arrival date and time: 12/31/16 1004   First Provider Initiated Contact with Patient 12/31/16 1059      Chief Complaint  Patient presents with  . vaginal pressure  . abdominal tightness   Abdominal Pain  This is a new problem. The current episode started in the past 7 days. The onset quality is gradual. The problem occurs intermittently. The problem has been unchanged. The patient is experiencing no pain. The abdominal pain radiates to the suprapubic region. Pertinent negatives include no anorexia, diarrhea, dysuria, hematuria, nausea or vomiting.    OB History    Gravida Para Term Preterm AB Living   1         0   SAB TAB Ectopic Multiple Live Births                  Past Medical History:  Diagnosis Date  . Headaches, cluster   . 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 Premenstrual dysphoric disorder   . UTI (lower urinary tract infection)     Past Surgical History:  Procedure Laterality Date  . NO PAST SURGERIES      Family History  Problem Relation Age of Onset  . Diabetes Mother   . Fibromyalgia Mother   . Hypertension Mother   . Congestive Heart Failure Father   . Narcolepsy Father   . Aneurysm Father   . Congestive Heart Failure Paternal Uncle   . Diabetes Maternal Grandmother   . Congestive Heart Failure Maternal Grandmother   . Diabetes Maternal Grandfather   . Congestive Heart Failure Maternal Grandfather   . Diabetes Paternal Grandmother   . Diabetes Paternal Grandfather   . Congestive Heart Failure Paternal Grandfather     Social History  Substance Use Topics  . Smoking status: Former Smoker    Packs/day: 0.03    Years:  3.00    Types: Cigarettes    Quit date: 11/22/2014  . Smokeless tobacco: Never Used  . Alcohol use No     Comment: "occasionally"    Allergies:  Allergies  Allergen Reactions  . Latex Hives, Itching and Other (See Comments)    Reaction:  Burning    No prescriptions prior to admission.    Review of Systems  Gastrointestinal: Positive for abdominal pain. Negative for anorexia, diarrhea, nausea and vomiting.  Genitourinary: Negative for dysuria and hematuria.   Physical Exam   Blood pressure 124/87, pulse 88, temperature 98.1 F (36.7 C), temperature source Oral, resp. rate 16, height 5' 2.5" (1.588 m), weight 77.6 kg (171 lb 1.3 oz), last menstrual period 05/25/2016.  Physical Exam  Constitutional: She is oriented to person, place, and time. She appears well-developed.  HENT:  Head: Normocephalic.  Neck: Normal range of motion.  Respiratory: Effort normal.  GI: Soft.  Genitourinary:  Genitourinary Comments: NEFG; no CMT; cervix is closed, long and thick.   Musculoskeletal: Normal range of motion.  Neurological: She is alert and oriented to person, place, and time.  Skin: Skin is warm and dry.    MAU Course  Procedures  MDM -wet prep negative -moderate leuks in urine, sent for culture -gc  ct pending -NST: 145 bpm, present acels, neg decels, moderate variability, no contractions -oral hydration Assessment and Plan   1. Lower abdominal pain    2. Patient stable for discharge; reassured patient of normalcy of pelvic pressure in 3rd trimester. Encouraged patient to constantly drink water throughout the day to prevent dehydration and prevent UTIs.  3.  All questions answered; patient to keep next prenatal visit.    Charlesetta Garibaldi Kooistra CNM 12/31/2016, 1:43 PM

## 2017-01-01 LAB — CULTURE, OB URINE

## 2017-01-02 LAB — GC/CHLAMYDIA PROBE AMP (~~LOC~~) NOT AT ARMC
CHLAMYDIA, DNA PROBE: NEGATIVE
NEISSERIA GONORRHEA: NEGATIVE

## 2017-01-11 ENCOUNTER — Ambulatory Visit (INDEPENDENT_AMBULATORY_CARE_PROVIDER_SITE_OTHER): Payer: Medicaid Other | Admitting: Advanced Practice Midwife

## 2017-01-11 ENCOUNTER — Encounter: Payer: Self-pay | Admitting: Advanced Practice Midwife

## 2017-01-11 VITALS — BP 110/70 | HR 74 | Wt 170.0 lb

## 2017-01-11 DIAGNOSIS — Z331 Pregnant state, incidental: Secondary | ICD-10-CM

## 2017-01-11 DIAGNOSIS — Z1389 Encounter for screening for other disorder: Secondary | ICD-10-CM

## 2017-01-11 DIAGNOSIS — Z3403 Encounter for supervision of normal first pregnancy, third trimester: Secondary | ICD-10-CM

## 2017-01-11 LAB — POCT URINALYSIS DIPSTICK
GLUCOSE UA: NEGATIVE
Ketones, UA: NEGATIVE
LEUKOCYTES UA: NEGATIVE
NITRITE UA: NEGATIVE
RBC UA: NEGATIVE

## 2017-01-11 NOTE — Patient Instructions (Signed)
Third Trimester of Pregnancy The third trimester is from week 28 through week 40 (months 7 through 9). The third trimester is a time when the unborn baby (fetus) is growing rapidly. At the end of the ninth month, the fetus is about 20 inches in length and weighs 6-10 pounds. Body changes during your third trimester Your body will continue to go through many changes during pregnancy. The changes vary from woman to woman. During the third trimester:  Your weight will continue to increase. You can expect to gain 25-35 pounds (11-16 kg) by the end of the pregnancy.  You may begin to get stretch marks on your hips, abdomen, and breasts.  You may urinate more often because the fetus is moving lower into your pelvis and pressing on your bladder.  You may develop or continue to have heartburn. This is caused by increased hormones that slow down muscles in the digestive tract.  You may develop or continue to have constipation because increased hormones slow digestion and cause the muscles that push waste through your intestines to relax.  You may develop hemorrhoids. These are swollen veins (varicose veins) in the rectum that can itch or be painful.  You may develop swollen, bulging veins (varicose veins) in your legs.  You may have increased body aches in the pelvis, back, or thighs. This is due to weight gain and increased hormones that are relaxing your joints.  You may have changes in your hair. These can include thickening of your hair, rapid growth, and changes in texture. Some women also have hair loss during or after pregnancy, or hair that feels dry or thin. Your hair will most likely return to normal after your baby is born.  Your breasts will continue to grow and they will continue to become tender. A yellow fluid (colostrum) may leak from your breasts. This is the first milk you are producing for your baby.  Your belly button may stick out.  You may notice more swelling in your hands,  face, or ankles.  You may have increased tingling or numbness in your hands, arms, and legs. The skin on your belly may also feel numb.  You may feel short of breath because of your expanding uterus.  You may have more problems sleeping. This can be caused by the size of your belly, increased need to urinate, and an increase in your body's metabolism.  You may notice the fetus "dropping," or moving lower in your abdomen (lightening).  You may have increased vaginal discharge.  You may notice your joints feel loose and you may have pain around your pelvic bone.  What to expect at prenatal visits You will have prenatal exams every 2 weeks until week 36. Then you will have weekly prenatal exams. During a routine prenatal visit:  You will be weighed to make sure you and the baby are growing normally.  Your blood pressure will be taken.  Your abdomen will be measured to track your baby's growth.  The fetal heartbeat will be listened to.  Any test results from the previous visit will be discussed.  You may have a cervical check near your due date to see if your cervix has softened or thinned (effaced).  You will be tested for Group B streptococcus. This happens between 35 and 37 weeks.  Your health care provider may ask you:  What your birth plan is.  How you are feeling.  If you are feeling the baby move.  If you have had   any abnormal symptoms, such as leaking fluid, bleeding, severe headaches, or abdominal cramping.  If you are using any tobacco products, including cigarettes, chewing tobacco, and electronic cigarettes.  If you have any questions.  Other tests or screenings that may be performed during your third trimester include:  Blood tests that check for low iron levels (anemia).  Fetal testing to check the health, activity level, and growth of the fetus. Testing is done if you have certain medical conditions or if there are problems during the  pregnancy.  Nonstress test (NST). This test checks the health of your baby to make sure there are no signs of problems, such as the baby not getting enough oxygen. During this test, a belt is placed around your belly. The baby is made to move, and its heart rate is monitored during movement.  What is false labor? False labor is a condition in which you feel small, irregular tightenings of the muscles in the womb (contractions) that usually go away with rest, changing position, or drinking water. These are called Braxton Hicks contractions. Contractions may last for hours, days, or even weeks before true labor sets in. If contractions come at regular intervals, become more frequent, increase in intensity, or become painful, you should see your health care provider. What are the signs of labor?  Abdominal cramps.  Regular contractions that start at 10 minutes apart and become stronger and more frequent with time.  Contractions that start on the top of the uterus and spread down to the lower abdomen and back.  Increased pelvic pressure and dull back pain.  A watery or bloody mucus discharge that comes from the vagina.  Leaking of amniotic fluid. This is also known as your "water breaking." It could be a slow trickle or a gush. Let your health care provider know if it has a color or strange odor. If you have any of these signs, call your health care provider right away, even if it is before your due date. Follow these instructions at home: Medicines  Follow your health care provider's instructions regarding medicine use. Specific medicines may be either safe or unsafe to take during pregnancy.  Take a prenatal vitamin that contains at least 600 micrograms (mcg) of folic acid.  If you develop constipation, try taking a stool softener if your health care provider approves. Eating and drinking  Eat a balanced diet that includes fresh fruits and vegetables, whole grains, good sources of protein  such as meat, eggs, or tofu, and low-fat dairy. Your health care provider will help you determine the amount of weight gain that is right for you.  Avoid raw meat and uncooked cheese. These carry germs that can cause birth defects in the baby.  If you have low calcium intake from food, talk to your health care provider about whether you should take a daily calcium supplement.  Eat four or five small meals rather than three large meals a day.  Limit foods that are high in fat and processed sugars, such as fried and sweet foods.  To prevent constipation: ? Drink enough fluid to keep your urine clear or pale yellow. ? Eat foods that are high in fiber, such as fresh fruits and vegetables, whole grains, and beans. Activity  Exercise only as directed by your health care provider. Most women can continue their usual exercise routine during pregnancy. Try to exercise for 30 minutes at least 5 days a week. Stop exercising if you experience uterine contractions.  Avoid heavy   lifting.  Do not exercise in extreme heat or humidity, or at high altitudes.  Wear low-heel, comfortable shoes.  Practice good posture.  You may continue to have sex unless your health care provider tells you otherwise. Relieving pain and discomfort  Take frequent breaks and rest with your legs elevated if you have leg cramps or low back pain.  Take warm sitz baths to soothe any pain or discomfort caused by hemorrhoids. Use hemorrhoid cream if your health care provider approves.  Wear a good support bra to prevent discomfort from breast tenderness.  If you develop varicose veins: ? Wear support pantyhose or compression stockings as told by your healthcare provider. ? Elevate your feet for 15 minutes, 3-4 times a day. Prenatal care  Write down your questions. Take them to your prenatal visits.  Keep all your prenatal visits as told by your health care provider. This is important. Safety  Wear your seat belt at  all times when driving.  Make a list of emergency phone numbers, including numbers for family, friends, the hospital, and police and fire departments. General instructions  Avoid cat litter boxes and soil used by cats. These carry germs that can cause birth defects in the baby. If you have a cat, ask someone to clean the litter box for you.  Do not travel far distances unless it is absolutely necessary and only with the approval of your health care provider.  Do not use hot tubs, steam rooms, or saunas.  Do not drink alcohol.  Do not use any products that contain nicotine or tobacco, such as cigarettes and e-cigarettes. If you need help quitting, ask your health care provider.  Do not use any medicinal herbs or unprescribed drugs. These chemicals affect the formation and growth of the baby.  Do not douche or use tampons or scented sanitary pads.  Do not cross your legs for long periods of time.  To prepare for the arrival of your baby: ? Take prenatal classes to understand, practice, and ask questions about labor and delivery. ? Make a trial run to the hospital. ? Visit the hospital and tour the maternity area. ? Arrange for maternity or paternity leave through employers. ? Arrange for family and friends to take care of pets while you are in the hospital. ? Purchase a rear-facing car seat and make sure you know how to install it in your car. ? Pack your hospital bag. ? Prepare the baby's nursery. Make sure to remove all pillows and stuffed animals from the baby's crib to prevent suffocation.  Visit your dentist if you have not gone during your pregnancy. Use a soft toothbrush to brush your teeth and be gentle when you floss. Contact a health care provider if:  You are unsure if you are in labor or if your water has broken.  You become dizzy.  You have mild pelvic cramps, pelvic pressure, or nagging pain in your abdominal area.  You have lower back pain.  You have persistent  nausea, vomiting, or diarrhea.  You have an unusual or bad smelling vaginal discharge.  You have pain when you urinate. Get help right away if:  Your water breaks before 37 weeks.  You have regular contractions less than 5 minutes apart before 37 weeks.  You have a fever.  You are leaking fluid from your vagina.  You have spotting or bleeding from your vagina.  You have severe abdominal pain or cramping.  You have rapid weight loss or weight gain.    You have shortness of breath with chest pain.  You notice sudden or extreme swelling of your face, hands, ankles, feet, or legs.  Your baby makes fewer than 10 movements in 2 hours.  You have severe headaches that do not go away when you take medicine.  You have vision changes. Summary  The third trimester is from week 28 through week 40, months 7 through 9. The third trimester is a time when the unborn baby (fetus) is growing rapidly.  During the third trimester, your discomfort may increase as you and your baby continue to gain weight. You may have abdominal, leg, and back pain, sleeping problems, and an increased need to urinate.  During the third trimester your breasts will keep growing and they will continue to become tender. A yellow fluid (colostrum) may leak from your breasts. This is the first milk you are producing for your baby.  False labor is a condition in which you feel small, irregular tightenings of the muscles in the womb (contractions) that eventually go away. These are called Braxton Hicks contractions. Contractions may last for hours, days, or even weeks before true labor sets in.  Signs of labor can include: abdominal cramps; regular contractions that start at 10 minutes apart and become stronger and more frequent with time; watery or bloody mucus discharge that comes from the vagina; increased pelvic pressure and dull back pain; and leaking of amniotic fluid. This information is not intended to replace advice  given to you by your health care provider. Make sure you discuss any questions you have with your health care provider. Document Released: 09/06/2001 Document Revised: 02/18/2016 Document Reviewed: 11/13/2012 Elsevier Interactive Patient Education  2017 [employer]  

## 2017-01-11 NOTE — Progress Notes (Signed)
G1P0 [redacted]w[redacted]d Estimated Date of Delivery: 03/01/17  Last menstrual period 05/25/2016.   BP weight and urine results all reviewed and noted.  Please refer to the obstetrical flow sheet for the fundal height and fetal heart rate documentation:  Patient reports good fetal movement, denies any bleeding and no rupture of membranes symptoms or regular contractions. Patient is without complaints. All questions were answered.  No orders of the defined types were placed in this encounter.   Plan:  Continued routine obstetrical care,   Return in about 2 weeks (around 01/25/2017) for LROB.

## 2017-01-19 ENCOUNTER — Ambulatory Visit (INDEPENDENT_AMBULATORY_CARE_PROVIDER_SITE_OTHER): Payer: Medicaid Other | Admitting: Obstetrics & Gynecology

## 2017-01-19 ENCOUNTER — Encounter: Payer: Self-pay | Admitting: Obstetrics & Gynecology

## 2017-01-19 VITALS — BP 122/82 | HR 106 | Wt 169.0 lb

## 2017-01-19 DIAGNOSIS — Z3403 Encounter for supervision of normal first pregnancy, third trimester: Secondary | ICD-10-CM

## 2017-01-19 DIAGNOSIS — Z1389 Encounter for screening for other disorder: Secondary | ICD-10-CM

## 2017-01-19 DIAGNOSIS — Z331 Pregnant state, incidental: Secondary | ICD-10-CM

## 2017-01-19 DIAGNOSIS — B3731 Acute candidiasis of vulva and vagina: Secondary | ICD-10-CM

## 2017-01-19 DIAGNOSIS — B373 Candidiasis of vulva and vagina: Secondary | ICD-10-CM | POA: Diagnosis not present

## 2017-01-19 LAB — POCT URINALYSIS DIPSTICK
GLUCOSE UA: NEGATIVE
Ketones, UA: NEGATIVE
LEUKOCYTES UA: NEGATIVE
NITRITE UA: NEGATIVE
Protein, UA: NEGATIVE
RBC UA: NEGATIVE

## 2017-01-19 MED ORDER — TERCONAZOLE 0.4 % VA CREA: 1.0000 | TOPICAL_CREAM | Freq: Every day | VAGINAL | 0 refills | Status: DC

## 2017-01-19 NOTE — Progress Notes (Signed)
G1P0 [redacted]w[redacted]d Estimated Date of Delivery: 03/01/17  Blood pressure 122/82, pulse (!) 106, weight 169 lb (76.7 kg), last menstrual period 05/25/2016.   Pt presented as a work in for concern of leaking fluid.  She states she had a couple of moist spots in her bed and underwear, no big gushes.     Heavy yeast infection, no ROM noted (pt is s/p antibiotic use)  BP weight and urine results all reviewed and noted.  Please refer to the obstetrical flow sheet for the fundal height and fetal heart rate documentation:  Patient reports good fetal movement, denies any bleeding and no rupture of membranes symptoms or regular contractions. Patient is without complaints. All questions were answered.  Orders Placed This Encounter  Procedures  . POCT urinalysis dipstick    Plan:  Continued routine obstetrical care, Terazol 7 as directed  Return for keep scheduled.

## 2017-01-26 ENCOUNTER — Ambulatory Visit (INDEPENDENT_AMBULATORY_CARE_PROVIDER_SITE_OTHER): Payer: Medicaid Other | Admitting: Women's Health

## 2017-01-26 ENCOUNTER — Encounter: Payer: Self-pay | Admitting: Women's Health

## 2017-01-26 VITALS — BP 120/60 | HR 109 | Wt 177.0 lb

## 2017-01-26 DIAGNOSIS — Z3403 Encounter for supervision of normal first pregnancy, third trimester: Secondary | ICD-10-CM

## 2017-01-26 DIAGNOSIS — Z331 Pregnant state, incidental: Secondary | ICD-10-CM

## 2017-01-26 DIAGNOSIS — Z1389 Encounter for screening for other disorder: Secondary | ICD-10-CM

## 2017-01-26 LAB — POCT URINALYSIS DIPSTICK
Blood, UA: NEGATIVE
Glucose, UA: NEGATIVE
KETONES UA: NEGATIVE
LEUKOCYTES UA: NEGATIVE
Nitrite, UA: NEGATIVE
PROTEIN UA: NEGATIVE

## 2017-01-26 NOTE — Progress Notes (Signed)
Low-risk OB appointment G1P0 7363w1d Estimated Date of Delivery: 03/01/17 BP 120/60   Pulse (!) 109   Wt 177 lb (80.3 kg)   LMP 05/25/2016 (Exact Date)   BMI 31.86 kg/m   BP, weight, and urine reviewed.  Refer to obstetrical flow sheet for FH & FHR.  Reports good fm.  Denies regular uc's, lof, vb, or uti s/s. Pressure yesterday. Swelling yesterday- none today.  Reviewed ptl s/s, fkc. Plan:  Continue routine obstetrical care  F/U in 1wks for OB appointment

## 2017-01-26 NOTE — Patient Instructions (Signed)
Call the office ([phone]) or go to [facility] if:  You begin to have strong, frequent contractions  Your water breaks.  Sometimes it is a big gush of fluid, sometimes it is just a trickle that keeps getting your panties wet or running down your legs  You have vaginal bleeding.  It is normal to have a small amount of spotting if your cervix was checked.   You don't feel your baby moving like normal.  If you don't, get you something to eat and drink and lay down and focus on feeling your baby move.  You should feel at least 10 movements in 2 hours.  If you don't, you should call the office or go to [facility].     Preterm Labor and Birth Information The normal length of a pregnancy is 39-41 weeks. Preterm labor is when labor starts before 37 completed weeks of pregnancy. What are the risk factors for preterm labor? Preterm labor is more likely to occur in women who:  Have certain infections during pregnancy such as a bladder infection, sexually transmitted infection, or infection inside the uterus (chorioamnionitis).  Have a shorter-than-normal cervix.  Have gone into preterm labor before.  Have had surgery on their cervix.  Are younger than age 17 or older than age 35.  Are African American.  Are pregnant with twins or multiple babies (multiple gestation).  Take street drugs or smoke while pregnant.  Do not gain enough weight while pregnant.  Became pregnant shortly after having been pregnant. What are the symptoms of preterm labor? Symptoms of preterm labor include:  Cramps similar to those that can happen during a menstrual period. The cramps may happen with diarrhea.  Pain in the abdomen or lower back.  Regular uterine contractions that may feel like tightening of the abdomen.  A feeling of increased pressure in the pelvis.  Increased watery or bloody mucus discharge from the vagina.  Water breaking (ruptured amniotic sac). Why is it important to  recognize signs of preterm labor? It is important to recognize signs of preterm labor because babies who are born prematurely may not be fully developed. This can put them at an increased risk for:  Long-term (chronic) heart and lung problems.  Difficulty immediately after birth with regulating body systems, including blood sugar, body temperature, heart rate, and breathing rate.  Bleeding in the brain.  Cerebral palsy.  Learning difficulties.  Death. These risks are highest for babies who are born before 34 weeks of pregnancy. How is preterm labor treated? Treatment depends on the length of your pregnancy, your condition, and the health of your baby. It may involve:  Having a stitch (suture) placed in your cervix to prevent your cervix from opening too early (cerclage).  Taking or being given medicines, such as:  Hormone medicines. These may be given early in pregnancy to help support the pregnancy.  Medicine to stop contractions.  Medicines to help mature the baby's lungs. These may be prescribed if the risk of delivery is high.  Medicines to prevent your baby from developing cerebral palsy. If the labor happens before 34 weeks of pregnancy, you may need to stay in the hospital. What should I do if I think I am in preterm labor? If you think that you are going into preterm labor, call your health care provider right away. How can I prevent preterm labor in future pregnancies? To increase your chance of having a full-term pregnancy:  Do not use any tobacco products, such   as cigarettes, chewing tobacco, and e-cigarettes. If you need help quitting, ask your health care provider.  Do not use street drugs or medicines that have not been prescribed to you during your pregnancy.  Talk with your health care provider before taking any herbal supplements, even if you have been taking them regularly.  Make sure you gain a healthy amount of weight during your pregnancy.  Watch for  infection. If you think that you might have an infection, get it checked right away.  Make sure to tell your health care provider if you have gone into preterm labor before. This information is not intended to replace advice given to you by your health care provider. Make sure you discuss any questions you have with your health care provider. Document Released: 12/03/2003 Document Revised: 02/23/2016 Document Reviewed: 02/03/2016 Elsevier Interactive Patient Education  2017 [employer]  

## 2017-02-03 ENCOUNTER — Inpatient Hospital Stay (HOSPITAL_COMMUNITY)
Admission: AD | Admit: 2017-02-03 | Discharge: 2017-02-04 | Disposition: A | Discharge status: Home / Self Care | Payer: Medicaid Other | Source: Ambulatory Visit | Attending: Family Medicine | Admitting: Family Medicine

## 2017-02-03 ENCOUNTER — Ambulatory Visit (INDEPENDENT_AMBULATORY_CARE_PROVIDER_SITE_OTHER): Payer: Medicaid Other | Admitting: Women's Health

## 2017-02-03 ENCOUNTER — Encounter: Payer: Self-pay | Admitting: Women's Health

## 2017-02-03 VITALS — BP 140/80 | HR 80 | Wt 179.0 lb

## 2017-02-03 DIAGNOSIS — O26843 Uterine size-date discrepancy, third trimester: Secondary | ICD-10-CM

## 2017-02-03 DIAGNOSIS — R030 Elevated blood-pressure reading, without diagnosis of hypertension: Secondary | ICD-10-CM

## 2017-02-03 DIAGNOSIS — Z1389 Encounter for screening for other disorder: Secondary | ICD-10-CM

## 2017-02-03 DIAGNOSIS — O163 Unspecified maternal hypertension, third trimester: Secondary | ICD-10-CM | POA: Insufficient documentation

## 2017-02-03 DIAGNOSIS — Z87891 Personal history of nicotine dependence: Secondary | ICD-10-CM | POA: Insufficient documentation

## 2017-02-03 DIAGNOSIS — Z3A36 36 weeks gestation of pregnancy: Secondary | ICD-10-CM | POA: Insufficient documentation

## 2017-02-03 DIAGNOSIS — Z331 Pregnant state, incidental: Secondary | ICD-10-CM

## 2017-02-03 DIAGNOSIS — O133 Gestational [pregnancy-induced] hypertension without significant proteinuria, third trimester: Secondary | ICD-10-CM

## 2017-02-03 DIAGNOSIS — Z3403 Encounter for supervision of normal first pregnancy, third trimester: Secondary | ICD-10-CM

## 2017-02-03 DIAGNOSIS — F1290 Cannabis use, unspecified, uncomplicated: Secondary | ICD-10-CM

## 2017-02-03 LAB — POCT URINALYSIS DIPSTICK
Blood, UA: NEGATIVE
GLUCOSE UA: NEGATIVE
KETONES UA: NEGATIVE
LEUKOCYTES UA: NEGATIVE
Nitrite, UA: NEGATIVE

## 2017-02-03 NOTE — Progress Notes (Addendum)
Low-risk OB appointment G1P0 2398w2d Estimated Date of Delivery: 03/01/17 BP 140/80   Pulse 80   Wt 179 lb (81.2 kg)   LMP 05/25/2016 (Exact Date)   BMI 32.22 kg/m   BP, weight, and urine reviewed.  Refer to obstetrical flow sheet for FH & FHR.  Reports good fm.  Denies regular uc's, lof, vb, or uti s/s. No complaints. Denies ha, visual changes, ruq/epigastric pain, n/v.   BP slightly elevated, tr proteinuria- has intermittently had trace throughout pregnancy No edema, DTRs 2+, no clonus Vtx by leopold's Reviewed ptl s/s, pre-e s/s, fkc. Plan: will get pre-e labs today F/U on Monday for OB appointment/bp check, gbs, and efw/afi u/s d/t s<d

## 2017-02-03 NOTE — Patient Instructions (Addendum)
Call the office ([phone]) or go to [facility] if:  You begin to have strong, frequent contractions  Your water breaks.  Sometimes it is a big gush of fluid, sometimes it is just a trickle that keeps getting your panties wet or running down your legs  You have vaginal bleeding.  It is normal to have a small amount of spotting if your cervix was checked.   You don't feel your baby moving like normal.  If you don't, get you something to eat and drink and lay down and focus on feeling your baby move.  You should feel at least 10 movements in 2 hours.  If you don't, you should call the office or go to [facility].    Call the office ([phone]) or go [facility] for these signs of pre-eclampsia:  Severe headache that does not go away with Tylenol  Visual changes- seeing spots, double, blurred vision  Pain under your right breast or upper abdomen that does not go away with Tums or heartburn medicine  Nausea and/or vomiting  Severe swelling in your hands, feet, and face      Braxton Hicks Contractions Contractions of the uterus can occur throughout pregnancy, but they are not always a sign that you are in labor. You may have practice contractions called Braxton Hicks contractions. These false labor contractions are sometimes confused with true labor. What are Braxton Hicks contractions? Braxton Hicks contractions are tightening movements that occur in the muscles of the uterus before labor. Unlike true labor contractions, these contractions do not result in opening (dilation) and thinning of the cervix. Toward the end of pregnancy (32-34 weeks), Braxton Hicks contractions can happen more often and may become stronger. These contractions are sometimes difficult to tell apart from true labor because they can be very uncomfortable. You should not feel embarrassed if you go to the hospital with false labor. Sometimes, the only way to tell if you are in true labor is for your  health care provider to look for changes in the cervix. The health care provider will do a physical exam and may monitor your contractions. If you are not in true labor, the exam should show that your cervix is not dilating and your water has not broken. If there are no prenatal problems or other health problems associated with your pregnancy, it is completely safe for you to be sent home with false labor. You may continue to have Braxton Hicks contractions until you go into true labor. How can I tell the difference between true labor and false labor?  Differences  False labor  Contractions last 30-70 seconds.: Contractions are usually shorter and not as strong as true labor contractions.  Contractions become very regular.: Contractions are usually irregular.  Discomfort is usually felt in the top of the uterus, and it spreads to the lower abdomen and low back.: Contractions are often felt in the front of the lower abdomen and in the groin.  Contractions do not go away with walking.: Contractions may go away when you walk around or change positions while lying down.  Contractions usually become more intense and increase in frequency.: Contractions get weaker and are shorter-lasting as time goes on.  The cervix dilates and gets thinner.: The cervix usually does not dilate or become thin. Follow these instructions at home:  Take over-the-counter and prescription medicines only as told by your health care provider.  Keep up with your usual exercises and follow other instructions from your health   care provider.  Eat and drink lightly if you think you are going into labor.  If Braxton Hicks contractions are making you uncomfortable:  Change your position from lying down or resting to walking, or change from walking to resting.  Sit and rest in a tub of warm water.  Drink enough fluid to keep your urine clear or pale yellow. Dehydration may cause these contractions.  Do slow and deep  breathing several times an hour.  Keep all follow-up prenatal visits as told by your health care provider. This is important. Contact a health care provider if:  You have a fever.  You have continuous pain in your abdomen. Get help right away if:  Your contractions become stronger, more regular, and closer together.  You have fluid leaking or gushing from your vagina.  You pass blood-tinged mucus (bloody show).  You have bleeding from your vagina.  You have low back pain that you never had before.  You feel your baby's head pushing down and causing pelvic pressure.  Your baby is not moving inside you as much as it used to. Summary  Contractions that occur before labor are called Braxton Hicks contractions, false labor, or practice contractions.  Braxton Hicks contractions are usually shorter, weaker, farther apart, and less regular than true labor contractions. True labor contractions usually become progressively stronger and regular and they become more frequent.  Manage discomfort from Braxton Hicks contractions by changing position, resting in a warm bath, drinking plenty of water, or practicing deep breathing. This information is not intended to replace advice given to you by your health care provider. Make sure you discuss any questions you have with your health care provider. Document Released: 09/12/2005 Document Revised: 08/01/2016 Document Reviewed: 08/01/2016 Elsevier Interactive Patient Education  2017 Elsevier Inc.  

## 2017-02-03 NOTE — MAU Note (Signed)
Blood pressure high at office today.  Swelling in left foot started today. No bleeding. No leaking. Baby less often in last hour.

## 2017-02-04 ENCOUNTER — Encounter (HOSPITAL_COMMUNITY): Payer: Self-pay | Admitting: *Deleted

## 2017-02-04 DIAGNOSIS — O133 Gestational [pregnancy-induced] hypertension without significant proteinuria, third trimester: Secondary | ICD-10-CM | POA: Diagnosis not present

## 2017-02-04 DIAGNOSIS — O163 Unspecified maternal hypertension, third trimester: Secondary | ICD-10-CM | POA: Diagnosis not present

## 2017-02-04 DIAGNOSIS — Z3A36 36 weeks gestation of pregnancy: Secondary | ICD-10-CM | POA: Diagnosis not present

## 2017-02-04 DIAGNOSIS — Z87891 Personal history of nicotine dependence: Secondary | ICD-10-CM | POA: Diagnosis not present

## 2017-02-04 LAB — CBC
HCT: 34.7 % — ABNORMAL LOW (ref 36.0–46.0)
Hematocrit: 35.1 % (ref 34.0–46.6)
Hemoglobin: 11.4 g/dL (ref 11.1–15.9)
Hemoglobin: 11.6 g/dL — ABNORMAL LOW (ref 12.0–15.0)
MCH: 27.7 pg (ref 26.6–33.0)
MCH: 27.9 pg (ref 26.0–34.0)
MCHC: 32.5 g/dL (ref 31.5–35.7)
MCHC: 33.4 g/dL (ref 30.0–36.0)
MCV: 83.4 fL (ref 78.0–100.0)
MCV: 85 fL (ref 79–97)
PLATELETS: 312 10*3/uL (ref 150–379)
Platelets: 300 10*3/uL (ref 150–400)
RBC: 4.12 x10E6/uL (ref 3.77–5.28)
RBC: 4.16 MIL/uL (ref 3.87–5.11)
RDW: 14.1 % (ref 11.5–15.5)
RDW: 14.9 % (ref 12.3–15.4)
WBC: 10 10*3/uL (ref 4.0–10.5)
WBC: 8.6 10*3/uL (ref 3.4–10.8)

## 2017-02-04 LAB — COMPREHENSIVE METABOLIC PANEL
ALK PHOS: 90 IU/L (ref 39–117)
ALT: 11 IU/L (ref 0–32)
ALT: 11 U/L — ABNORMAL LOW (ref 14–54)
ANION GAP: 6 (ref 5–15)
AST: 13 IU/L (ref 0–40)
AST: 16 U/L (ref 15–41)
Albumin/Globulin Ratio: 1.2 (ref 1.2–2.2)
Albumin: 3.1 g/dL — ABNORMAL LOW (ref 3.5–5.0)
Albumin: 3.4 g/dL — ABNORMAL LOW (ref 3.5–5.5)
Alkaline Phosphatase: 78 U/L (ref 38–126)
BUN/Creatinine Ratio: 22 (ref 9–23)
BUN: 12 mg/dL (ref 6–20)
BUN: 13 mg/dL (ref 6–20)
CALCIUM: 9.3 mg/dL (ref 8.7–10.2)
CHLORIDE: 105 mmol/L (ref 96–106)
CHLORIDE: 109 mmol/L (ref 101–111)
CO2: 16 mmol/L — AB (ref 18–29)
CO2: 20 mmol/L — AB (ref 22–32)
Calcium: 9.3 mg/dL (ref 8.9–10.3)
Creatinine, Ser: 0.51 mg/dL (ref 0.44–1.00)
Creatinine, Ser: 0.58 mg/dL (ref 0.57–1.00)
GFR calc Af Amer: 149 mL/min/{1.73_m2} (ref >59)
GFR calc Af Amer: >60 mL/min (ref >60)
GFR calc non Af Amer: 129 mL/min/{1.73_m2} (ref >59)
GFR calc non Af Amer: >60 mL/min (ref >60)
GLUCOSE: 73 mg/dL (ref 65–99)
GLUCOSE: 82 mg/dL (ref 65–99)
Globulin, Total: 2.9 g/dL (ref 1.5–4.5)
POTASSIUM: 3.9 mmol/L (ref 3.5–5.1)
POTASSIUM: 4.3 mmol/L (ref 3.5–5.2)
SODIUM: 135 mmol/L (ref 135–145)
Sodium: 139 mmol/L (ref 134–144)
TOTAL PROTEIN: 6.3 g/dL (ref 6.0–8.5)
Total Bilirubin: 0.3 mg/dL (ref 0.3–1.2)
Total Protein: 7.2 g/dL (ref 6.5–8.1)

## 2017-02-04 LAB — PROTEIN / CREATININE RATIO, URINE
Creatinine, Urine: 161 mg/dL
Creatinine, Urine: 210 mg/dL
PROTEIN CREATININE RATIO: 0.07 mg/mg{creat} (ref 0.00–0.15)
PROTEIN UR: 23.5 mg/dL
Protein/Creat Ratio: 112 mg/g creat (ref 0–200)
TOTAL PROTEIN, URINE: 12 mg/dL

## 2017-02-04 LAB — MED LIST OPTION NOT SELECTED

## 2017-02-04 NOTE — MAU Provider Note (Signed)
History     CSN: [id number]  Arrival date and time: 02/03/17 2326  First Provider Initiated Contact with Patient 02/04/17 0019      Chief Complaint  Patient presents with  . Hypertension   HPI Bethany GinDana N Gomez is a 26 y.o. G1P0 at 5158w3d who presents for hypertension. [facility]. Had elevated BP (for 2nd time in pregnancy), labs were collected & are pending. Patient is to f/u in office on Monday for BP check.  Noticed LLE swelling this evening so was concerned about blood pressure. Denies headache, visual disturbance, or epigastric pain. Positive fetal movement.   OB History    Gravida Para Term Preterm AB Living   1         0   SAB TAB Ectopic Multiple Live Births                  Past Medical History:  Diagnosis Date  . Headaches, cluster   . 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 Premenstrual dysphoric disorder   . UTI (lower urinary tract infection)     Past Surgical History:  Procedure Laterality Date  . NO PAST SURGERIES      Family History  Problem Relation Age of Onset  . Diabetes Mother   . Fibromyalgia Mother   . Hypertension Mother   . Congestive Heart Failure Father   . Narcolepsy Father   . Aneurysm Father   . Congestive Heart Failure Paternal Uncle   . Diabetes Maternal Grandmother   . Congestive Heart Failure Maternal Grandmother   . Diabetes Maternal Grandfather   . Congestive Heart Failure Maternal Grandfather   . Diabetes Paternal Grandmother   . Diabetes Paternal Grandfather   . Congestive Heart Failure Paternal Grandfather     Social History  Substance Use Topics  . Smoking status: Former Smoker    Packs/day: 0.03    Years: 3.00    Types: Cigarettes    Quit date: 11/22/2014  . Smokeless tobacco: Never Used  . Alcohol use No     Comment: "occasionally"    Allergies:  Allergies  Allergen Reactions  . Latex Hives, Itching and Other (See Comments)    Reaction:  Burning    Prescriptions Prior to Admission   Medication Sig Dispense Refill Last Dose  . acetaminophen (TYLENOL) 325 MG tablet Take 325 mg by mouth every 6 (six) hours as needed for mild pain, moderate pain or headache.   Past Month at Unknown time  . nitrofurantoin (MACRODANTIN) 100 MG capsule Take 100 mg by mouth 2 (two) times daily.    Past Month at Unknown time  . Prenatal Vit-Fe Fumarate-FA (MULTIVITAMIN-PRENATAL) 27-0.8 MG TABS tablet Take 1 tablet by mouth daily.    Past Month at Unknown time    Review of Systems  Constitutional: Negative.   Eyes: Negative for visual disturbance.  Cardiovascular: Positive for leg swelling.  Gastrointestinal: Negative.   Genitourinary: Negative.   Neurological: Negative for headaches.   Physical Exam   Blood pressure 129/86, pulse 86, temperature 98.3 F (36.8 C), temperature source Oral, resp. rate 16, height 5' 2.5" (1.588 m), weight 178 lb 12 oz (81.1 kg), last menstrual period 05/25/2016, SpO2 100 %.  Temp:  [98.3 F (36.8 C)] 98.3 F (36.8 C) (05/11 2337) Pulse Rate:  [80-96] 87 (05/12 0058) Resp:  [16] 16 (05/12 0014) BP: (129-140)/(80-97) 138/97 (05/12 0116) SpO2:  [100 %] 100 % (05/11 2337) Weight:  [178 lb 12  oz (81.1 kg)-179 lb (81.2 kg)] 178 lb 12 oz (81.1 kg) (05/11 2337)  Physical Exam  Nursing note and vitals reviewed. Constitutional: She is oriented to person, place, and time. She appears well-developed and well-nourished. No distress.  HENT:  Head: Normocephalic and atraumatic.  Eyes: Conjunctivae are normal. Right eye exhibits no discharge. Left eye exhibits no discharge. No scleral icterus.  Neck: Normal range of motion.  Cardiovascular: Normal rate, regular rhythm and normal heart sounds.   No murmur heard. Respiratory: Effort normal and breath sounds normal. No respiratory distress. She has no wheezes.  GI: Soft. Bowel sounds are normal. There is no tenderness.  Musculoskeletal: She exhibits no edema.  Neurological: She is alert and oriented to person, place,  and time. She has normal reflexes.  No clonus  Skin: Skin is warm and dry. She is not diaphoretic.  Psychiatric: She has a normal mood and affect. Her behavior is normal. Judgment and thought content normal.   Fetal Tracing:  Baseline: 135 Variability: moderate Accelerations: 15x15 Decelerations: none  Toco: none MAU Course  Procedures Results for orders placed or performed during the hospital encounter of 02/03/17 (from the past 24 hour(s))  CBC     Status: Abnormal   Collection Time: 02/04/17 12:27 AM  Result Value Ref Range   WBC 10.0 4.0 - 10.5 K/uL   RBC 4.16 3.87 - 5.11 MIL/uL   Hemoglobin 11.6 (L) 12.0 - 15.0 g/dL   HCT 16.1 (L) 09.6 - 04.5 %   MCV 83.4 78.0 - 100.0 fL   MCH 27.9 26.0 - 34.0 pg   MCHC 33.4 30.0 - 36.0 g/dL   RDW 40.9 81.1 - 91.4 %   Platelets 300 150 - 400 K/uL  Comprehensive metabolic panel     Status: Abnormal   Collection Time: 02/04/17 12:27 AM  Result Value Ref Range   Sodium 135 135 - 145 mmol/L   Potassium 3.9 3.5 - 5.1 mmol/L   Chloride 109 101 - 111 mmol/L   CO2 20 (L) 22 - 32 mmol/L   Glucose, Bld 73 65 - 99 mg/dL   BUN 12 6 - 20 mg/dL   Creatinine, Ser 7.82 0.44 - 1.00 mg/dL   Calcium 9.3 8.9 - 95.6 mg/dL   Total Protein 7.2 6.5 - 8.1 g/dL   Albumin 3.1 (L) 3.5 - 5.0 g/dL   AST 16 15 - 41 U/L   ALT 11 (L) 14 - 54 U/L   Alkaline Phosphatase 78 38 - 126 U/L   Total Bilirubin 0.3 0.3 - 1.2 mg/dL   GFR calc non Af Amer >60 >60 mL/min   GFR calc Af Amer >60 >60 mL/min   Anion gap 6 5 - 15  Protein / creatinine ratio, urine     Status: None   Collection Time: 02/04/17 12:30 AM  Result Value Ref Range   Creatinine, Urine 161.00 mg/dL   Total Protein, Urine 12 mg/dL   Protein Creatinine Ratio 0.07 0.00 - 0.15 mg/mg[Cre]    MDM Reactive fetal tracing Cycle BPs -- none severe range CBC, CMP, urine PCR --- labs WNL Assessment and Plan  A: 1. Pregnancy-induced hypertension in third trimester    P: Discharge home F/u in office on  Monday as scheduled Discussed reasons to return to [facility]U including s/s preeclampsia  Judeth Horn 02/04/2017, 12:19 AM

## 2017-02-04 NOTE — Discharge Instructions (Signed)
Hypertension During Pregnancy °Hypertension, commonly called high blood pressure, is when the force of blood pumping through your arteries is too strong. Arteries are blood vessels that carry blood from the heart throughout the body. Hypertension during pregnancy can cause problems for you and your baby. Your baby may be born early (prematurely) or may not weigh as much as he or she should at birth. Very bad cases of hypertension during pregnancy can be life-threatening. °Different types of hypertension can occur during pregnancy. These include: °· Chronic hypertension. This happens when: °¨ You have hypertension before pregnancy and it continues during pregnancy. °¨ You develop hypertension before you are [redacted] weeks pregnant, and it continues during pregnancy. °· Gestational hypertension. This is hypertension that develops after the 20th week of pregnancy. °· Preeclampsia, also called toxemia of pregnancy. This is a very serious type of hypertension that develops only during pregnancy. It affects the whole body, and it can be very dangerous for you and your baby. °Gestational hypertension and preeclampsia usually go away within 6 weeks after your baby is born. Women who have hypertension during pregnancy have a greater chance of developing hypertension later in life or during future pregnancies. °What are the causes? °The exact cause of hypertension is not known. °What increases the risk? °There are certain factors that make it more likely for you to develop hypertension during pregnancy. These include: °· Having hypertension during a previous pregnancy or prior to pregnancy. °· Being overweight. °· Being older than age 40. °· Being pregnant for the first time or being pregnant with more than one baby. °· Becoming pregnant using fertilization methods such as IVF (in vitro fertilization). °· Having diabetes, kidney problems, or systemic lupus erythematosus. °· Having a family history of hypertension. °What are the  signs or symptoms? °Chronic hypertension and gestational hypertension rarely cause symptoms. Preeclampsia causes symptoms, which may include: °· Increased protein in your urine. Your health care provider will check for this at every visit before you give birth (prenatal visit). °· Severe headaches. °· Sudden weight gain. °· Swelling of the hands, face, legs, and feet. °· Nausea and vomiting. °· Vision problems, such as blurred or double vision. °· Numbness in the face, arms, legs, and feet. °· Dizziness. °· Slurred speech. °· Sensitivity to bright lights. °· Abdominal pain. °· Convulsions. °How is this diagnosed? °You may be diagnosed with hypertension during a routine prenatal exam. At each prenatal visit, you may: °· Have a urine test to check for high amounts of protein in your urine. °· Have your blood pressure checked. A blood pressure reading is recorded as two numbers, such as "120 over 80" (or 120/80). The first ("top") number is called the systolic pressure. It is a measure of the pressure in your arteries when your heart beats. The second ("bottom") number is called the diastolic pressure. It is a measure of the pressure in your arteries as your heart relaxes between beats. Blood pressure is measured in a unit called mm Hg. A normal blood pressure reading is: °¨ Systolic: below 120. °¨ Diastolic: below 80. °The type of hypertension that you are diagnosed with depends on your test results and when your symptoms developed. °· Chronic hypertension is usually diagnosed before 20 weeks of pregnancy. °· Gestational hypertension is usually diagnosed after 20 weeks of pregnancy. °· Hypertension with high amounts of protein in the urine is diagnosed as preeclampsia. °· Blood pressure measurements that stay above 160 systolic, or above 110 diastolic, are signs of severe preeclampsia. °  How is this treated? °Treatment for hypertension during pregnancy varies depending on the type of hypertension you have and how  serious it is. °· If you take medicines called ACE inhibitors to treat chronic hypertension, you may need to switch medicines. ACE inhibitors should not be taken during pregnancy. °· If you have gestational hypertension, you may need to take blood pressure medicine. °· If you are at risk for preeclampsia, your health care provider may recommend that you take a low-dose aspirin every day to prevent high blood pressure during your pregnancy. °· If you have severe preeclampsia, you may need to be hospitalized so you and your baby can be monitored closely. You may also need to take medicine (magnesium sulfate) to prevent seizures and to lower blood pressure. This medicine may be given as an injection or through an IV tube. °· In some cases, if your condition gets worse, you may need to deliver your baby early. °Follow these instructions at home: °Eating and drinking °· Drink enough fluid to keep your urine clear or pale yellow. °· Eat a healthy diet that is low in salt (sodium). Do not add salt to your food. Check food labels to see how much sodium a food or beverage contains. °Lifestyle °· Do not use any products that contain nicotine or tobacco, such as cigarettes and e-cigarettes. If you need help quitting, ask your health care provider. °· Do not use alcohol. °· Avoid caffeine. °· Avoid stress as much as possible. Rest and get plenty of sleep. °General instructions °· Take over-the-counter and prescription medicines only as told by your health care provider. °· While lying down, lie on your left side. This keeps pressure off your baby. °· While sitting or lying down, raise (elevate) your feet. Try putting some pillows under your lower legs. °· Exercise regularly. Ask your health care provider what kinds of exercise are best for you. °· Keep all prenatal and follow-up visits as told by your health care provider. This is important. °Contact a health care provider if: °· You have symptoms that your health care provider  told you may require more treatment or monitoring, such as: °¨ Fever. °¨ Vomiting. °¨ Headache. °Get help right away if: °· You have severe abdominal pain or vomiting that does not get better with treatment. °· You suddenly develop swelling in your hands, ankles, or face. °· You gain 4 lbs (1.8 kg) or more in 1 week. °· You develop vaginal bleeding, or you have blood in your urine. °· You do not feel your baby moving as much as usual. °· You have blurred or double vision. °· You have muscle twitching or sudden tightening (spasms). °· You have shortness of breath. °· Your lips or fingernails turn blue. °This information is not intended to replace advice given to you by your health care provider. Make sure you discuss any questions you have with your health care provider. °Document Released: 05/31/2011 Document Revised: 04/01/2016 Document Reviewed: 02/26/2016 °Elsevier Interactive Patient Education © 2017 Elsevier Inc. ° °

## 2017-02-06 ENCOUNTER — Ambulatory Visit (INDEPENDENT_AMBULATORY_CARE_PROVIDER_SITE_OTHER): Payer: Medicaid Other | Admitting: Obstetrics & Gynecology

## 2017-02-06 ENCOUNTER — Telehealth (HOSPITAL_COMMUNITY): Payer: Self-pay | Admitting: *Deleted

## 2017-02-06 ENCOUNTER — Encounter: Payer: Self-pay | Admitting: Obstetrics & Gynecology

## 2017-02-06 ENCOUNTER — Other Ambulatory Visit: Payer: Self-pay | Admitting: Women's Health

## 2017-02-06 ENCOUNTER — Encounter (HOSPITAL_COMMUNITY): Payer: Self-pay | Admitting: *Deleted

## 2017-02-06 ENCOUNTER — Ambulatory Visit (INDEPENDENT_AMBULATORY_CARE_PROVIDER_SITE_OTHER): Payer: Medicaid Other

## 2017-02-06 VITALS — BP 142/88 | HR 95 | Wt 179.0 lb

## 2017-02-06 DIAGNOSIS — Z3403 Encounter for supervision of normal first pregnancy, third trimester: Secondary | ICD-10-CM

## 2017-02-06 DIAGNOSIS — O26843 Uterine size-date discrepancy, third trimester: Secondary | ICD-10-CM | POA: Diagnosis not present

## 2017-02-06 DIAGNOSIS — O0993 Supervision of high risk pregnancy, unspecified, third trimester: Secondary | ICD-10-CM

## 2017-02-06 DIAGNOSIS — Z1389 Encounter for screening for other disorder: Secondary | ICD-10-CM

## 2017-02-06 DIAGNOSIS — Z3A36 36 weeks gestation of pregnancy: Secondary | ICD-10-CM

## 2017-02-06 DIAGNOSIS — O365990 Maternal care for other known or suspected poor fetal growth, unspecified trimester, not applicable or unspecified: Secondary | ICD-10-CM

## 2017-02-06 DIAGNOSIS — Z331 Pregnant state, incidental: Secondary | ICD-10-CM

## 2017-02-06 LAB — POCT URINALYSIS DIPSTICK
Blood, UA: NEGATIVE
GLUCOSE UA: NEGATIVE
Ketones, UA: NEGATIVE
Leukocytes, UA: NEGATIVE
NITRITE UA: NEGATIVE
Protein, UA: NEGATIVE

## 2017-02-06 LAB — PMP SCREEN PROFILE (10S), URINE
AMPHETAMINE SCREEN URINE: NEGATIVE ng/mL
BARBITURATE SCREEN URINE: NEGATIVE ng/mL
BENZODIAZEPINE SCREEN, URINE: NEGATIVE ng/mL
CANNABINOIDS UR QL SCN: NEGATIVE ng/mL
COCAINE(METAB.)SCREEN, URINE: NEGATIVE ng/mL
Creatinine(Crt), U: 213.2 mg/dL (ref 20.0–300.0)
METHADONE SCREEN, URINE: NEGATIVE ng/mL
OPIATE SCREEN URINE: NEGATIVE ng/mL
OXYCODONE+OXYMORPHONE UR QL SCN: NEGATIVE ng/mL
PHENCYCLIDINE QUANTITATIVE URINE: NEGATIVE ng/mL
PROPOXYPHENE SCREEN URINE: NEGATIVE ng/mL
Ph of Urine: 5.9 (ref 4.5–8.9)

## 2017-02-06 LAB — OB RESULTS CONSOLE GBS: STREP GROUP B AG: POSITIVE

## 2017-02-06 NOTE — Treatment Plan (Signed)
   Induction Assessment Scheduling Form: [facility] to [facility]:  [phone]  Bethany GinDana N Gomez                                                                                   DOB:  1990-12-10                                                            MRN:  [id number]                                                                     Phone #:   [phone]                         Provider:  [facility]  GP:  G1P0                                                            Estimated Date of Delivery: 03/01/17  Dating Criteria: 2290w6d sonogram confirms LMP dates    Medical Indications for induction:  FGR with elevated Doppler ratios Admission Date/Time:  02/09/2017@0700  Gestational age on admission:  6363w1d   Filed Weights   02/06/17 1423  Weight: 179 lb (81.2 kg)   HIV:  Non Reactive (03/19 0854) MVH:QIONGEXGBS:pending    Cervical exam pending   Method of induction(proposed):  cytotec, foley, pitocin   Scheduling Provider Signature:  Lazaro ArmsEURE,Yui Mulvaney H, MD                                            Today's Date:  02/06/2017  3:04 PM

## 2017-02-06 NOTE — Telephone Encounter (Signed)
Preadmission screen  

## 2017-02-06 NOTE — Progress Notes (Signed)
US 36+5 wks,cephalic,post pl gr 1,simple right ov cyst 3.3 x 2.8 x 2.8 cm,normal left ovary,afi 12 cm,fhr 135 bpm,BPP 8/8,RI .77,1.0,.82 99%,EFW 1920 g 1.1%

## 2017-02-06 NOTE — Progress Notes (Signed)
Fetal Surveillance Testing today:  BPP 8/8 with elevated Doppler flow   High Risk Pregnancy Diagnosis(es):   FGR <2% with elevated Doppler ratios, borderline GHTN  G1P0 7243w5d Estimated Date of Delivery: 03/01/17  Blood pressure (!) 142/88, pulse 95, weight 179 lb (81.2 kg), last menstrual period 05/25/2016.  Urinalysis: Negative   HPI: The patient is being seen today for ongoing management of as above. Today she reports no complaints   BP weight and urine results all reviewed and noted. Patient reports good fetal movement, denies any bleeding and no rupture of membranes symptoms or regular contractions.  Fundal Height:  32 Fetal Heart rate:  144 Edema:  none  Patient is without complaints other than noted in her HPI. All questions were answered.  All lab and sonogram results have been reviewed. Comments:    Assessment:  1.  Pregnancy at 5143w5d,  Estimated Date of Delivery: 03/01/17 :                          2.  FGR<2%                        3.  Elevated Doppler flow ratios 99%  Medication(s) Plans:  none  Treatment Plan:  Induction scheduled 02/09/2017@0700   Return in about 6 weeks (around 03/20/2017) for post partum visit. for appointment for high risk OB care  No orders of the defined types were placed in this encounter.  Orders Placed This Encounter  Procedures  . GC/Chlamydia Probe Amp  . Culture, beta strep (group b only)  . POCT urinalysis dipstick

## 2017-02-08 ENCOUNTER — Other Ambulatory Visit: Payer: Self-pay | Admitting: Obstetrics and Gynecology

## 2017-02-08 ENCOUNTER — Ambulatory Visit (INDEPENDENT_AMBULATORY_CARE_PROVIDER_SITE_OTHER): Payer: Medicaid Other | Admitting: Obstetrics and Gynecology

## 2017-02-08 ENCOUNTER — Encounter: Payer: Self-pay | Admitting: Obstetrics and Gynecology

## 2017-02-08 VITALS — BP 140/98 | HR 90 | Wt 179.0 lb

## 2017-02-08 DIAGNOSIS — Z3403 Encounter for supervision of normal first pregnancy, third trimester: Secondary | ICD-10-CM | POA: Diagnosis not present

## 2017-02-08 DIAGNOSIS — Z331 Pregnant state, incidental: Secondary | ICD-10-CM

## 2017-02-08 DIAGNOSIS — Z1389 Encounter for screening for other disorder: Secondary | ICD-10-CM

## 2017-02-08 LAB — POCT URINALYSIS DIPSTICK
Glucose, UA: NEGATIVE
KETONES UA: NEGATIVE
LEUKOCYTES UA: NEGATIVE
Nitrite, UA: NEGATIVE
Protein, UA: NEGATIVE
RBC UA: NEGATIVE

## 2017-02-08 LAB — GC/CHLAMYDIA PROBE AMP
CHLAMYDIA, DNA PROBE: NEGATIVE
NEISSERIA GONORRHOEAE BY PCR: NEGATIVE

## 2017-02-08 NOTE — Progress Notes (Signed)
Patient ID: Alphonsa GinDana N Guilfoil, female   DOB: [birth year], 26 y.o.   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  Chief Complaint  Patient presents with  . w/i    constant right upper gastric pain-throbbing at times, nausea  ____  Today she reports she had worsening, throbbing RUQ pain with radiation across the right side that began this morning. Pt states her pain was initially constant but is now intermittent, and currently resolved. She denies HA or blurry vision, leg swelling. She did not attend childbirth classes.   Patient reports good fetal movement. She denies any bleeding and no rupture of membranes symptoms or regular contractions.  BP weight and urine results reviewed and noted. Blood pressure (!) 140/98, pulse 90, weight 179 lb (81.2 kg), last menstrual period 05/25/2016.  Fundal Height:  31 cm Fetal Heart rate:  144 bpm Physical Examination: Abdomen - soft, nontender, nondistended, no masses or organomegaly                                     Edema:  none  Urinalysis:NEGATIVE for all  Fetal Surveillance Testing today:  none  Lab and sonogram results have been reviewed.  Assessment:  1.  Pregnancy at 2223w0d,  G1P0   :  Estimated Date of Delivery: 03/01/17                         2.  FGR <2% with elevated Doppler ratios                        3. GHTN  Medication(s) Plans:  none  Treatment Plan:   1. Proceed with scheduled induction 02/09/2017@0700   Follow up in 1 week for appointment for BP check.   By signing my name below, I, Doreatha MartinEva Mathews, attest that this documentation has been prepared under the direction and in the presence of Tilda BurrowFerguson, Khanh Tanori V, MD. Electronically Signed: Doreatha MartinEva Mathews, ED Scribe. 02/08/17. 9:59 AM.  I personally performed the services described  in this documentation, which was SCRIBED in my presence. The recorded information has been reviewed and considered accurate. It has been edited as necessary during review. Tilda BurrowFERGUSON,Shaquille Janes V, MD

## 2017-02-09 ENCOUNTER — Encounter (HOSPITAL_COMMUNITY): Payer: Self-pay

## 2017-02-09 ENCOUNTER — Inpatient Hospital Stay (HOSPITAL_COMMUNITY)
Admission: RE | Admit: 2017-02-09 | Discharge: 2017-02-12 | DRG: 775 | Disposition: A | Discharge status: Home / Self Care | Payer: Medicaid Other | Source: Ambulatory Visit | Attending: Obstetrics & Gynecology | Admitting: Obstetrics & Gynecology

## 2017-02-09 ENCOUNTER — Inpatient Hospital Stay (HOSPITAL_COMMUNITY): Payer: Medicaid Other | Admitting: Anesthesiology

## 2017-02-09 DIAGNOSIS — Z87891 Personal history of nicotine dependence: Secondary | ICD-10-CM | POA: Diagnosis not present

## 2017-02-09 DIAGNOSIS — O99824 Streptococcus B carrier state complicating childbirth: Secondary | ICD-10-CM | POA: Diagnosis present

## 2017-02-09 DIAGNOSIS — Z833 Family history of diabetes mellitus: Secondary | ICD-10-CM

## 2017-02-09 DIAGNOSIS — Z8249 Family history of ischemic heart disease and other diseases of the circulatory system: Secondary | ICD-10-CM

## 2017-02-09 DIAGNOSIS — F1290 Cannabis use, unspecified, uncomplicated: Secondary | ICD-10-CM | POA: Diagnosis present

## 2017-02-09 DIAGNOSIS — O365930 Maternal care for other known or suspected poor fetal growth, third trimester, not applicable or unspecified: Secondary | ICD-10-CM | POA: Diagnosis present

## 2017-02-09 DIAGNOSIS — O134 Gestational [pregnancy-induced] hypertension without significant proteinuria, complicating childbirth: Secondary | ICD-10-CM | POA: Diagnosis present

## 2017-02-09 DIAGNOSIS — Z3A37 37 weeks gestation of pregnancy: Secondary | ICD-10-CM | POA: Diagnosis not present

## 2017-02-09 DIAGNOSIS — O99324 Drug use complicating childbirth: Secondary | ICD-10-CM | POA: Diagnosis present

## 2017-02-09 DIAGNOSIS — Z91040 Latex allergy status: Secondary | ICD-10-CM

## 2017-02-09 DIAGNOSIS — Z8759 Personal history of other complications of pregnancy, childbirth and the puerperium: Secondary | ICD-10-CM | POA: Diagnosis present

## 2017-02-09 DIAGNOSIS — F329 Major depressive disorder, single episode, unspecified: Secondary | ICD-10-CM | POA: Diagnosis present

## 2017-02-09 DIAGNOSIS — O99344 Other mental disorders complicating childbirth: Secondary | ICD-10-CM | POA: Diagnosis present

## 2017-02-09 HISTORY — DX: Personal history of other complications of pregnancy, childbirth and the puerperium: Z87.59

## 2017-02-09 LAB — TYPE AND SCREEN
ABO/RH(D): O POS
Antibody Screen: NEGATIVE

## 2017-02-09 LAB — CBC
HCT: 36.4 % (ref 36.0–46.0)
HEMATOCRIT: 35.9 % — AB (ref 36.0–46.0)
HEMOGLOBIN: 12 g/dL (ref 12.0–15.0)
Hemoglobin: 12 g/dL (ref 12.0–15.0)
MCH: 27.8 pg (ref 26.0–34.0)
MCH: 27.7 pg (ref 26.0–34.0)
MCHC: 33.4 g/dL (ref 30.0–36.0)
MCHC: 33 g/dL (ref 30.0–36.0)
MCV: 83.1 fL (ref 78.0–100.0)
MCV: 84.1 fL (ref 78.0–100.0)
Platelets: 308 10*3/uL (ref 150–400)
Platelets: 265 10*3/uL (ref 150–400)
RBC: 4.32 MIL/uL (ref 3.87–5.11)
RBC: 4.33 MIL/uL (ref 3.87–5.11)
RDW: 14.1 % (ref 11.5–15.5)
RDW: 14.1 % (ref 11.5–15.5)
WBC: 11.4 10*3/uL — ABNORMAL HIGH (ref 4.0–10.5)
WBC: 11.7 10*3/uL — ABNORMAL HIGH (ref 4.0–10.5)

## 2017-02-09 LAB — CULTURE, BETA STREP (GROUP B ONLY): Strep Gp B Culture: POSITIVE — AB

## 2017-02-09 LAB — COMPREHENSIVE METABOLIC PANEL
ALBUMIN: 3.1 g/dL — AB (ref 3.5–5.0)
ALT: 11 U/L — ABNORMAL LOW (ref 14–54)
AST: 17 U/L (ref 15–41)
Alkaline Phosphatase: 95 U/L (ref 38–126)
Anion gap: 7 (ref 5–15)
BUN: 12 mg/dL (ref 6–20)
CHLORIDE: 108 mmol/L (ref 101–111)
CO2: 19 mmol/L — AB (ref 22–32)
Calcium: 9.2 mg/dL (ref 8.9–10.3)
Creatinine, Ser: 0.58 mg/dL (ref 0.44–1.00)
GFR calc Af Amer: >60 mL/min (ref >60)
GFR calc non Af Amer: >60 mL/min (ref >60)
GLUCOSE: 83 mg/dL (ref 65–99)
POTASSIUM: 4.1 mmol/L (ref 3.5–5.1)
Sodium: 134 mmol/L — ABNORMAL LOW (ref 135–145)
Total Bilirubin: 0.4 mg/dL (ref 0.3–1.2)
Total Protein: 7 g/dL (ref 6.5–8.1)

## 2017-02-09 LAB — PROTEIN / CREATININE RATIO, URINE
Creatinine, Urine: 102 mg/dL
PROTEIN CREATININE RATIO: 0.09 mg/mg{creat} (ref 0.00–0.15)
Total Protein, Urine: 9 mg/dL

## 2017-02-09 LAB — RPR: RPR Ser Ql: NONREACTIVE

## 2017-02-09 LAB — ABO/RH: ABO/RH(D): O POS

## 2017-02-09 MED ORDER — DIPHENHYDRAMINE HCL 50 MG/ML IJ SOLN: 12.5000 mg | INTRAMUSCULAR | Status: DC | PRN

## 2017-02-09 MED ORDER — OXYTOCIN 40 UNITS IN LACTATED RINGERS INFUSION - SIMPLE MED
1.0000 m[IU]/min | INTRAVENOUS | Status: DC
  Administered 2017-02-09: 2 m[IU]/min via INTRAVENOUS

## 2017-02-09 MED ORDER — MISOPROSTOL 25 MCG QUARTER TABLET
25.0000 ug | ORAL_TABLET | ORAL | Status: DC | PRN
  Administered 2017-02-09: 25 ug via VAGINAL
  Filled 2017-02-09 (×2): qty 1

## 2017-02-09 MED ORDER — PENICILLIN G POT IN DEXTROSE 60000 UNIT/ML IV SOLN
3.0000 10*6.[IU] | INTRAVENOUS | Status: DC
  Administered 2017-02-09 – 2017-02-10 (×3): 3 10*6.[IU] via INTRAVENOUS
  Filled 2017-02-09 (×5): qty 50

## 2017-02-09 MED ORDER — PENICILLIN G POT IN DEXTROSE 60000 UNIT/ML IV SOLN
3.0000 10*6.[IU] | INTRAVENOUS | Status: DC
Start: 2017-02-09 — End: 2017-02-09
  Filled 2017-02-09 (×4): qty 50

## 2017-02-09 MED ORDER — OXYTOCIN BOLUS FROM INFUSION
500.0000 mL | Freq: Once | INTRAVENOUS | Status: AC
  Administered 2017-02-10: 500 mL via INTRAVENOUS

## 2017-02-09 MED ORDER — LACTATED RINGERS IV SOLN
500.0000 mL | Freq: Once | INTRAVENOUS | Status: AC
  Administered 2017-02-09: 500 mL via INTRAVENOUS

## 2017-02-09 MED ORDER — OXYTOCIN 40 UNITS IN LACTATED RINGERS INFUSION - SIMPLE MED
2.5000 [IU]/h | INTRAVENOUS | Status: DC
  Filled 2017-02-09: qty 1000

## 2017-02-09 MED ORDER — LACTATED RINGERS IV SOLN
500.0000 mL | INTRAVENOUS | Status: DC | PRN
  Administered 2017-02-09: 200 mL via INTRAVENOUS
  Administered 2017-02-10: 500 mL via INTRAVENOUS
  Administered 2017-02-10: 250 mL via INTRAVENOUS
  Administered 2017-02-10: 500 mL via INTRAVENOUS

## 2017-02-09 MED ORDER — PENICILLIN G POTASSIUM 5000000 UNITS IJ SOLR
5.0000 10*6.[IU] | Freq: Once | INTRAVENOUS | Status: AC
  Administered 2017-02-09: 5 10*6.[IU] via INTRAVENOUS
  Filled 2017-02-09: qty 5

## 2017-02-09 MED ORDER — TERBUTALINE SULFATE 1 MG/ML IJ SOLN: 0.2500 mg | Freq: Once | INTRAMUSCULAR | Status: DC | PRN

## 2017-02-09 MED ORDER — FENTANYL 2.5 MCG/ML BUPIVACAINE 1/10 % EPIDURAL INFUSION (WH - ANES)
14.0000 mL/h | INTRAMUSCULAR | Status: DC | PRN
  Administered 2017-02-09 – 2017-02-10 (×2): 14 mL/h via EPIDURAL
  Filled 2017-02-09 (×2): qty 100

## 2017-02-09 MED ORDER — PHENYLEPHRINE 40 MCG/ML (10ML) SYRINGE FOR IV PUSH (FOR BLOOD PRESSURE SUPPORT)
80.0000 ug | PREFILLED_SYRINGE | INTRAVENOUS | Status: DC | PRN
  Filled 2017-02-09: qty 10
  Filled 2017-02-09: qty 5

## 2017-02-09 MED ORDER — LACTATED RINGERS IV SOLN
INTRAVENOUS | Status: DC
  Administered 2017-02-09 – 2017-02-10 (×4): via INTRAVENOUS

## 2017-02-09 MED ORDER — LIDOCAINE HCL (PF) 1 % IJ SOLN
30.0000 mL | INTRAMUSCULAR | Status: DC | PRN
  Filled 2017-02-09: qty 30

## 2017-02-09 MED ORDER — SOD CITRATE-CITRIC ACID 500-334 MG/5ML PO SOLN: 30.0000 mL | ORAL | Status: DC | PRN

## 2017-02-09 MED ORDER — ONDANSETRON HCL 4 MG/2ML IJ SOLN
4.0000 mg | Freq: Four times a day (QID) | INTRAMUSCULAR | Status: DC | PRN
  Administered 2017-02-09 – 2017-02-10 (×2): 4 mg via INTRAVENOUS
  Filled 2017-02-09 (×2): qty 2

## 2017-02-09 MED ORDER — FENTANYL CITRATE (PF) 100 MCG/2ML IJ SOLN
100.0000 ug | INTRAMUSCULAR | Status: DC | PRN
  Administered 2017-02-09 (×2): 100 ug via INTRAVENOUS
  Filled 2017-02-09 (×2): qty 2

## 2017-02-09 MED ORDER — PHENYLEPHRINE 40 MCG/ML (10ML) SYRINGE FOR IV PUSH (FOR BLOOD PRESSURE SUPPORT)
80.0000 ug | PREFILLED_SYRINGE | INTRAVENOUS | Status: DC | PRN
  Filled 2017-02-09: qty 5

## 2017-02-09 MED ORDER — LIDOCAINE HCL (PF) 1 % IJ SOLN
INTRAMUSCULAR | Status: DC | PRN
  Administered 2017-02-09: 13 mL via EPIDURAL

## 2017-02-09 MED ORDER — FLEET ENEMA 7-19 GM/118ML RE ENEM: 1.0000 | ENEMA | RECTAL | Status: DC | PRN

## 2017-02-09 MED ORDER — EPHEDRINE 5 MG/ML INJ
10.0000 mg | INTRAVENOUS | Status: DC | PRN
  Filled 2017-02-09: qty 2

## 2017-02-09 MED ORDER — OXYCODONE-ACETAMINOPHEN 5-325 MG PO TABS: 2.0000 | ORAL_TABLET | ORAL | Status: DC | PRN

## 2017-02-09 MED ORDER — OXYCODONE-ACETAMINOPHEN 5-325 MG PO TABS: 1.0000 | ORAL_TABLET | ORAL | Status: DC | PRN

## 2017-02-09 MED ORDER — ACETAMINOPHEN 325 MG PO TABS: 650.0000 mg | ORAL_TABLET | ORAL | Status: DC | PRN

## 2017-02-09 MED ORDER — EPHEDRINE 5 MG/ML INJ
10.0000 mg | INTRAVENOUS | Status: DC | PRN
Start: 2017-02-09 — End: 2017-02-12
  Filled 2017-02-09: qty 2

## 2017-02-09 NOTE — Progress Notes (Signed)
Patient ID: Alphonsa GinDana N Billingham, female   DOB: August 05, 1991, 26 y.o.   MRN: [id number]  S: Patient seen & examined for progress of labor. Patient comfortable in the room.    O:  Vitals:   02/09/17 1157 02/09/17 1432 02/09/17 1640 02/09/17 1700  BP: 127/78 136/81 140/86   Pulse: 91 90 (!) 102   Resp: 18 18 18 17   Temp: 99.2 F (37.3 C) 98.2 F (36.8 C)    TempSrc: Oral Oral    Weight:      Height:        Dilation: 2.5 Effacement (%): 60 Cervical Position: Posterior Station: -3 Presentation: Vertex Exam by:: dr Omer Jackmumaw Bedside US confirmed vertex presentation FB still in place  FHT: 140bpm, min-mod var, no accels, no decels TOCO: q3-284min   A/P: Pitocin started after vertex presentation confirmed with BSUS Continue expectant management Anticipate SVD

## 2017-02-09 NOTE — Progress Notes (Signed)
Patient ID: Bethany Gomez, female   DOB: 01-Feb-1991, 26 y.o.   MRN: [id number]  S: Patient seen & examined for progress of labor. Patient comfortable.     O:  Vitals:   02/09/17 0736 02/09/17 0739 02/09/17 0838 02/09/17 1157  BP:  (!) 137/94 134/86 127/78  Pulse:  99 89 91  Resp:  18 18 18   Temp:  97.8 F (36.6 C)  99.2 F (37.3 C)  TempSrc:  Oral  Oral  Weight: 179 lb (81.2 kg)     Height: 5' 2.5" (1.588 m)       Dilation: 1 Effacement (%): 60 Cervical Position: Posterior Station: -3 Presentation: Vertex Exam by:: dr Omer Jackmumaw   FHT: 140bpm, min-mod var, +accels, a couple variable decels TOCO: infrequent   A/P: Foley bulb placed Stopping cytotec, may start pitocin if baby tolerates Continue expectant management Anticipate SVD

## 2017-02-09 NOTE — Anesthesia Preprocedure Evaluation (Signed)
Anesthesia Evaluation  Patient identified by MRN, date of birth, ID band Patient awake    Reviewed: Allergy & Precautions, NPO status , Patient's Chart, lab work & pertinent test results  Airway Mallampati: II  TM Distance: >3 FB Neck ROM: Full    Dental no notable dental hx.    Pulmonary neg pulmonary ROS, former smoker,    Pulmonary exam normal breath sounds clear to auscultation       Cardiovascular hypertension, negative cardio ROS Normal cardiovascular exam Rhythm:Regular Rate:Normal     Neuro/Psych  Headaches, negative neurological ROS  negative psych ROS   GI/Hepatic negative GI ROS, Neg liver ROS,   Endo/Other  negative endocrine ROS  Renal/GU negative Renal ROS  negative genitourinary   Musculoskeletal negative musculoskeletal ROS (+)   Abdominal   Peds negative pediatric ROS (+)  Hematology negative hematology ROS (+)   Anesthesia Other Findings   Reproductive/Obstetrics negative OB ROS (+) Pregnancy                             Anesthesia Physical Anesthesia Plan  ASA: II  Anesthesia Plan: Epidural   Post-op Pain Management:    Induction:   Airway Management Planned:   Additional Equipment:   Intra-op Plan:   Post-operative Plan:   Informed Consent:   Plan Discussed with:   Anesthesia Plan Comments:         Anesthesia Quick Evaluation

## 2017-02-09 NOTE — Anesthesia Procedure Notes (Signed)
Epidural Patient location during procedure: OB Start time: 02/09/2017 7:47 PM End time: 02/09/2017 8:03 PM  Staffing Anesthesiologist: Anitra LauthMILLER, Gladis Soley RAY Performed: anesthesiologist   Preanesthetic Checklist Completed: patient identified, site marked, surgical consent, pre-op evaluation, timeout performed, IV checked, risks and benefits discussed and monitors and equipment checked  Epidural Patient position: sitting Prep: DuraPrep Patient monitoring: heart rate, cardiac monitor, continuous pulse ox and blood pressure Approach: midline Location: L2-L3 Injection technique: LOR saline  Needle:  Needle type: Tuohy  Needle gauge: 17 G Needle length: 9 cm Needle insertion depth: 7 cm Catheter type: closed end flexible Catheter size: 20 Guage Catheter at skin depth: 11 cm Test dose: negative  Assessment Events: blood not aspirated, injection not painful, no injection resistance, negative IV test and no paresthesia  Additional Notes Reason for block:procedure for pain

## 2017-02-09 NOTE — Progress Notes (Signed)
Labor Progress Note  Bethany Gomez is a 26 y.o. G1P0 at 6733w1d  admitted for induction of labor due to IUGR.  S: Patient reporting some painful contractioons. Says she is tired and feels like she could sleep.    O:  BP 123/74   Pulse 72   Temp 97.8 F (36.6 C) (Oral)   Resp 16   Ht 5' 2.5" (1.588 m)   Wt 179 lb (81.2 kg)   LMP 05/25/2016 (Exact Date)   SpO2 100%   BMI 32.22 kg/m   No intake/output data recorded.  FHT:  FHR: 150 bpm, variability: minimal ,  accelerations:  Present,  decelerations:  Absent UC:   regular, every 3 minutes SVE:   Dilation: 4 Effacement (%): 50 Station: -2 Exam by:: Laural RoesB. Parks, RN SROM/AROM: Membranes intact Pitocin @ 9 mL/hr  Labs: Lab Results  Component Value Date   WBC 11.7 (H) 02/09/2017   HGB 12.0 02/09/2017   HCT 36.4 02/09/2017   MCV 84.1 02/09/2017   PLT 265 02/09/2017    Assessment / Plan: 26 y.o. G1P0 233w1d  in active labor Induction of labor due to IUGR,  progressing well on pitocin  Labor: Progressing on Pitocin, will continue to increase then AROM Fetal Wellbeing:  Category I Pain Control:  Epidural Anticipated MOD:  NSVD  Expectant management  Tarri AbernethyAbigail J Lancaster, MD, MPH PGY-2 [facility]

## 2017-02-09 NOTE — Anesthesia Pain Management Evaluation Note (Signed)
  CRNA Pain Management Visit Note  Patient: Bethany Gomez, 26 y.o., female  "Hello I am a member of the anesthesia team at [facility]. We have an anesthesia team available at all times to provide care throughout the hospital, including epidural management and anesthesia for C-section. I don't know your plan for the delivery whether it a natural birth, water birth, IV sedation, nitrous supplementation, doula or epidural, but we want to meet your pain goals."   1.Was your pain managed to your expectations on prior hospitalizations?   No prior hospitalizations  2.What is your expectation for pain management during this hospitalization?     Epidural  3.How can we help you reach that goal? epidural  Record the patient's initial score and the patient's pain goal.   Pain: 0  Pain Goal: 5 [facility] wants you to be able to say your pain was always managed very well.  Bethany Gomez 02/09/2017

## 2017-02-09 NOTE — H&P (Signed)
LABOR ADMISSION HISTORY AND PHYSICAL  Bethany Gomez is a 26 y.o. female G1P0 with IUP at 8659w1d by LMP + 9 wk US presenting for induction of labor due to IUGR. Patient has been for FGR <2% with elevated Doppler ratios as well as gestational HTN. Patient denies vaginal bleeding, loss of fluids, or contractions. Endorses fetal movement. Denies any pain or discomfort at the moment.   She plans on breast feeding. She request Nexplanon for birth control.  Dating: By LMP and 9 wk [place] --->  Estimated Date of Delivery: 03/01/17  Sono:    @[redacted]w[redacted]d , CWD, normal anatomy, cephalic presentation, 327g   Prenatal History/Complications:  Past Medical History: Past Medical History:  Diagnosis Date  . Headaches, cluster   . 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 Premenstrual dysphoric disorder   . UTI (lower urinary tract infection)     Past Surgical History: Past Surgical History:  Procedure Laterality Date  . NO PAST SURGERIES      Obstetrical History: OB History    Gravida Para Term Preterm AB Living   1         0   SAB TAB Ectopic Multiple Live Births                  Social History: Social History   Social History  . Marital status: Single    Spouse name: N/A  . Number of children: N/A  . Years of education: N/A   Social History Main Topics  . Smoking status: Former Smoker    Packs/day: 0.03    Years: 3.00    Types: Cigarettes    Quit date: 11/22/2014  . Smokeless tobacco: Never Used  . Alcohol use No     Comment: prior to preg  . Drug use: No     Comment: last used 08/07/16  . Sexual activity: Yes    Birth control/ protection: None   Other Topics Concern  . None   Social History Narrative  . None    Family History: Family History  Problem Relation Age of Onset  . Diabetes Mother   . Fibromyalgia Mother   . Hypertension Mother   . Congestive Heart Failure Father   . Narcolepsy Father   . Aneurysm Father   . Congestive Heart Failure Paternal Uncle   . Diabetes Maternal  Grandmother   . Congestive Heart Failure Maternal Grandmother   . Diabetes Maternal Grandfather   . Congestive Heart Failure Maternal Grandfather   . Diabetes Paternal Grandmother   . Diabetes Paternal Grandfather   . Congestive Heart Failure Paternal Grandfather     Allergies: Allergies  Allergen Reactions  . Latex Hives, Itching and Other (See Comments)    Reaction:  Burning    Prescriptions Prior to Admission  Medication Sig Dispense Refill Last Dose  . Prenatal Vit-Fe Fumarate-FA (MULTIVITAMIN-PRENATAL) 27-0.8 MG TABS tablet Take 1 tablet by mouth daily.    Past Month at Unknown time  . acetaminophen (TYLENOL) 325 MG tablet Take 325 mg by mouth every 6 (six) hours as needed for mild pain, moderate pain or headache.   Taking     Review of Systems   All systems reviewed and negative except as stated in HPI  BP (!) 137/94   Pulse 99   Temp 97.8 F (36.6 C) (Oral)   Resp 18   Ht 5' 2.5" (1.588 m)   Wt 179 lb (81.2 kg)   LMP 05/25/2016 (Exact Date)   BMI 32.22 kg/m  General  appearance: alert, cooperative and appears stated age Lungs: clear to auscultation bilaterally Heart: regular rate and rhythm Abdomen: soft, non-tender; bowel sounds normal Pelvic: No vaginal bleeding or discharge Extremities: No LE edema, no calf swelling or tenderness Presentation: cephalic Fetal monitoringBaseline: 150 bpm, Variability: Good {> 6 bpm), Accelerations: Reactive and Decelerations: Absent Uterine activity: Irregular    Prenatal labs: ABO, Rh: O/Positive/-- (11/14 1500) Antibody: Negative (03/19 0854) Rubella: !Error! Immune RPR: Non Reactive (03/19 0854)  HBsAg: Negative (11/14 1500)  HIV: Non Reactive (03/19 0854)  GBS: Positive (05/14 0000) Positive 1 hr Glucola: Failed, 2 hr normal (79/71/75) Genetic screening: Neg Anatomy US: Normal female  Prenatal Transfer Tool  Maternal Diabetes: No Genetic Screening: Normal Maternal Ultrasounds/Referrals: Normal Fetal Ultrasounds  or other Referrals:  None Maternal Substance Abuse:  Yes:  Type: Marijuana Significant Maternal Medications:  None Significant Maternal Lab Results: Lab values include: Group B Strep positive  Results for orders placed or performed in visit on 02/08/17 (from the past 24 hour(s))  POCT urinalysis dipstick   Collection Time: 02/08/17  9:13 AM  Result Value Ref Range   Color, UA     Clarity, UA     Glucose, UA neg    Bilirubin, UA     Ketones, UA neg    Spec Grav, UA  1.010 - 1.025   Blood, UA neg    pH, UA  5.0 - 8.0   Protein, UA neg    Urobilinogen, UA  0.2 or 1.0 E.U./dL   Nitrite, UA neg    Leukocytes, UA Negative Negative    Patient Active Problem List   Diagnosis Date Noted  . Gestational hypertension 02/09/2017  . Depression 11/14/2016  . 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 Supervision of normal first pregnancy 08/09/2016    Assessment: Bethany Gomez is a 26 y.o. G1P0 at [redacted]w[redacted]d here for induction for IUGR and gestational HTN.   #Labor:Begin pitocin.  #Pain: IV pain medication and epidural available at patient's request #FWB: Cat 1 #ID:  GBS positive. Will treat with PCN.  #MOF: Breast #MOC:Possibly Nexplanon #Circ:  Yes, at FT  Tarri Abernethy, MD, MPH PGY-2 [facility]   OB FELLOW HISTORY AND PHYSICAL ATTESTATION  I have seen and examined this patient; I agree with above documentation in the resident's note.    Jen Mow, DO OB Fellow 02/09/2017, 12:20 PM

## 2017-02-10 ENCOUNTER — Encounter (HOSPITAL_COMMUNITY): Payer: Self-pay

## 2017-02-10 DIAGNOSIS — Z3A37 37 weeks gestation of pregnancy: Secondary | ICD-10-CM

## 2017-02-10 DIAGNOSIS — O99824 Streptococcus B carrier state complicating childbirth: Secondary | ICD-10-CM

## 2017-02-10 DIAGNOSIS — O134 Gestational [pregnancy-induced] hypertension without significant proteinuria, complicating childbirth: Secondary | ICD-10-CM

## 2017-02-10 DIAGNOSIS — O365930 Maternal care for other known or suspected poor fetal growth, third trimester, not applicable or unspecified: Secondary | ICD-10-CM

## 2017-02-10 MED ORDER — ZOLPIDEM TARTRATE 5 MG PO TABS: 5.0000 mg | ORAL_TABLET | Freq: Every evening | ORAL | Status: DC | PRN

## 2017-02-10 MED ORDER — DIPHENHYDRAMINE HCL 25 MG PO CAPS: 25.0000 mg | ORAL_CAPSULE | Freq: Four times a day (QID) | ORAL | Status: DC | PRN

## 2017-02-10 MED ORDER — ACETAMINOPHEN 325 MG PO TABS: 650.0000 mg | ORAL_TABLET | ORAL | Status: DC | PRN

## 2017-02-10 MED ORDER — IBUPROFEN 600 MG PO TABS
600.0000 mg | ORAL_TABLET | Freq: Four times a day (QID) | ORAL | Status: DC
  Administered 2017-02-10 – 2017-02-12 (×8): 600 mg via ORAL
  Filled 2017-02-10 (×8): qty 1

## 2017-02-10 MED ORDER — ONDANSETRON HCL 4 MG/2ML IJ SOLN: 4.0000 mg | INTRAMUSCULAR | Status: DC | PRN

## 2017-02-10 MED ORDER — SENNOSIDES-DOCUSATE SODIUM 8.6-50 MG PO TABS
2.0000 | ORAL_TABLET | ORAL | Status: DC
  Administered 2017-02-11 – 2017-02-12 (×2): 2 via ORAL
  Filled 2017-02-10 (×2): qty 2

## 2017-02-10 MED ORDER — SIMETHICONE 80 MG PO CHEW: 80.0000 mg | CHEWABLE_TABLET | ORAL | Status: DC | PRN

## 2017-02-10 MED ORDER — BENZOCAINE-MENTHOL 20-0.5 % EX AERO: 1.0000 "application " | INHALATION_SPRAY | CUTANEOUS | Status: DC | PRN

## 2017-02-10 MED ORDER — LACTATED RINGERS IV SOLN
INTRAVENOUS | Status: DC
  Administered 2017-02-10: 06:00:00 via INTRAUTERINE

## 2017-02-10 MED ORDER — TETANUS-DIPHTH-ACELL PERTUSSIS 5-2.5-18.5 LF-MCG/0.5 IM SUSP
0.5000 mL | Freq: Once | INTRAMUSCULAR | Status: AC
  Administered 2017-02-11: 0.5 mL via INTRAMUSCULAR
  Filled 2017-02-10: qty 0.5

## 2017-02-10 MED ORDER — DIBUCAINE 1 % RE OINT: 1.0000 "application " | TOPICAL_OINTMENT | RECTAL | Status: DC | PRN

## 2017-02-10 MED ORDER — OXYCODONE HCL 5 MG PO TABS: 10.0000 mg | ORAL_TABLET | ORAL | Status: DC | PRN

## 2017-02-10 MED ORDER — OXYCODONE HCL 5 MG PO TABS: 5.0000 mg | ORAL_TABLET | ORAL | Status: DC | PRN

## 2017-02-10 MED ORDER — PRENATAL MULTIVITAMIN CH
1.0000 | ORAL_TABLET | Freq: Every day | ORAL | Status: DC
  Administered 2017-02-10 – 2017-02-11 (×2): 1 via ORAL
  Filled 2017-02-10 (×2): qty 1

## 2017-02-10 MED ORDER — WITCH HAZEL-GLYCERIN EX PADS: 1.0000 "application " | MEDICATED_PAD | CUTANEOUS | Status: DC | PRN

## 2017-02-10 MED ORDER — ONDANSETRON HCL 4 MG PO TABS: 4.0000 mg | ORAL_TABLET | ORAL | Status: DC | PRN

## 2017-02-10 MED ORDER — COCONUT OIL OIL
1.0000 "application " | TOPICAL_OIL | Status: DC | PRN
  Administered 2017-02-11: 1 via TOPICAL
  Filled 2017-02-10: qty 120

## 2017-02-10 NOTE — Anesthesia Postprocedure Evaluation (Signed)
Anesthesia Post Note  Patient: Bethany Gomez  Procedure(s) Performed: * No procedures listed *  Patient location during evaluation: [facility] Anesthesia Type: Epidural Level of consciousness: awake and alert and oriented Pain management: pain level controlled Vital Signs Assessment: post-procedure vital signs reviewed and stable Respiratory status: spontaneous breathing and nonlabored ventilation Cardiovascular status: stable Postop Assessment: no headache, patient able to bend at knees, no backache, no signs of nausea or vomiting, epidural receding and adequate PO intake Anesthetic complications: no        Last Vitals:  Vitals:   02/10/17 0848 02/10/17 1004  BP: 121/81 133/75  Pulse: 85 (!) 105  Resp: 18 18  Temp: 36.3 C 36.7 C    Last Pain:  Vitals:   02/10/17 1230  TempSrc:   PainSc: 2    Pain Goal:                 Land O'LakesMalinova,Shenique Childers Hristova

## 2017-02-10 NOTE — Progress Notes (Signed)
Labor Progress Note  Bethany Gomez is a 26 y.o. G1P0 at 5334w1d  aAlphonsa Gomez for induction of labor due to IUGR.  S: Reporting painful contractions.    O:  BP 115/71   Pulse 92   Temp 97.6 F (36.4 C) (Oral)   Resp 18   Ht 5' 2.5" (1.588 m)   Wt 179 lb (81.2 kg)   LMP 05/25/2016 (Exact Date)   SpO2 100%   BMI 32.22 kg/m   Total I/O In: -  Out: 1250 [Urine:1250]  FHT:  FHR: 150s bpm, variability: minimal ,  accelerations:  Present,  decelerations:  Absent UC:   regular, every 2-3 minutes SVE:   Dilation: 5 Effacement (%): 80 Station: 0 Exam by:: Bethany Gomez, CNM SROM/AROM: Membranes intact Pitocin @ 12 mu/min  Labs: Lab Results  Component Value Date   WBC 11.7 (H) 02/09/2017   HGB 12.0 02/09/2017   HCT 36.4 02/09/2017   MCV 84.1 02/09/2017   PLT 265 02/09/2017    Assessment / Plan: 26 y.o. G1P0 6034w1d  in active labor Induction of labor due to IUGR,  progressing well on pitocin. AROM of slightly bloody fluid around 0400.   Labor: Progressing on pitocin. Fetal Wellbeing:  Category I Pain Control:  Epidural Anticipated MOD:  NSVD  Expectant management  Bethany AbernethyAbigail J Katleen Carraway, MD, MPH PGY-2 [facility]

## 2017-02-10 NOTE — Lactation Note (Addendum)
This note was copied from a baby's chart. Lactation Consultation Note  Patient Name: Bethany Gomez [id number]'XToday's Date: 02/10/2017 Reason for consult: Initial assessment;Infant < 6lbs;NICU baby (early term baby, IUGR weight 3 lbs 14 oz)   With this first time mom of an IUGR less than 4 pound 37 2/7 week baby, in NICU. I started mom pumping and hand expressing at 3 hours PP, and she was able to collect close to 1 ml, which I brought to the baby in NICU. Mom demonstrated good technique with hand expression, and basic pumping teaching with mom from the NICU booklet on providing EBM for a NICU baby. I decreased mom to 21 flanges with a good fit, and cautioned mom to increase to 24 if 21 too tight. Mom's RN to bring her coconut oil to use with pumping. Mom encouraged to hold baby skin to skin as often as possible, and to set her pumping times after STS, at least every 3 hours, 8 times a day, followed by HE.  [place] and I sent a fax for mom to receive a DEP. Mom knows to call for lactation for any questions/conerns.    Maternal Data Formula Feeding for Exclusion: Yes Reason for exclusion: Mother's choice to formula and breast feed on admission Has patient been taught Hand Expression?: Yes Does the patient have breastfeeding experience prior to this delivery?: No  Feeding    LATCH Score/Interventions                      Lactation Tools Discussed/Used WIC Program: Yes ([place] [facility] [place]) Pump Review: Setup, frequency, and cleaning;Milk Storage;Other (comment) (hand expression, pump use and settings, NICU information on providing EBm) Initiated by:: Danton Claphristine Lesslie Mossa, Rn IBCLC Date initiated:: 02/10/17   Consult Status Consult Status: Follow-up Date: 02/11/17 Follow-up type: In-patient    Bethany LevinsLee, Bethany Gomez 02/10/2017, 11:19 AM

## 2017-02-11 NOTE — Lactation Note (Signed)
This note was copied from a baby's chart. Lactation Consultation Note  Patient Name: Bethany Sigurd SosDana Flinchbaugh ZOXWR'UToday's Date: 02/11/2017   Mom in NICU, spoke with FOB.  Baby 32 hrs old.  FOB states Mom is pumping and getting drops.  He states he is telling her to not get discouraged, that it will take time.  FOB will tell Mom to ask for assistance from RN if she has any questions.   Lactation to follow up prn and in am.     Judee ClaraSmith, Yoltzin Barg E 02/11/2017, 3:09 PM

## 2017-02-11 NOTE — Progress Notes (Signed)
Post Partum Day 1 Subjective: no complaints, up ad lib, voiding and tolerating PO  Objective: Blood pressure 112/76, pulse 87, temperature 98.5 F (36.9 C), temperature source Oral, resp. rate 18, height 5' 2.5" (1.588 m), weight 179 lb (81.2 kg), last menstrual period 05/25/2016, SpO2 100 %, unknown if currently breastfeeding.  Physical Exam:  General: alert, cooperative and no distress Lochia: appropriate Uterine Fundus: firm Incision:  DVT Evaluation: No evidence of DVT seen on physical exam.   Recent Labs  02/09/17 0730 02/09/17 1826  HGB 12.0 12.0  HCT 35.9* 36.4    Assessment/Plan: Plan for discharge tomorrow and Breastfeeding   LOS: 2 days   EURE,LUTHER H 02/11/2017, 9:41 AM

## 2017-02-12 MED ORDER — IBUPROFEN 600 MG PO TABS: 600.0000 mg | ORAL_TABLET | Freq: Four times a day (QID) | ORAL | 0 refills | Status: DC

## 2017-02-12 NOTE — Discharge Instructions (Signed)

## 2017-02-12 NOTE — Progress Notes (Signed)
Discharge teaching complete. Pt understood all information and did not have any questions. Pt ambulated out of the hospital and discharged home to family.  

## 2017-02-12 NOTE — Discharge Summary (Signed)
Obstetric Discharge Summary Reason for Admission: induction of labor Prenatal Procedures: none Intrapartum Procedures: spontaneous vaginal delivery and GBS prophylaxis Postpartum Procedures: none Complications-Operative and Postpartum: none Hemoglobin  Date Value Ref Range Status  02/09/2017 12.0 12.0 - 15.0 g/dL Final   HCT  Date Value Ref Range Status  02/09/2017 36.4 36.0 - 46.0 % Final   Hematocrit  Date Value Ref Range Status  02/03/2017 35.1 34.0 - 46.6 % Final    Physical Exam:  General: alert, cooperative and no distress Lochia: appropriate Uterine Fundus: firm  Discharge Diagnoses: Term Pregnancy-delivered  Discharge Information: Date: 02/12/2017 Activity: pelvic rest Diet: routine Medications: Ibuprofen Condition: stable Instructions: refer to practice specific booklet Discharge to: home Follow-up Information    [facility]. Schedule an appointment as soon as possible for a visit in 4 week(s).   Specialty:  Obstetrics and Gynecology Why:  for postpartum appointment and Nexplanon  Contact information: [address] [place] [id number] [phone]          Newborn Data: Live born female  Birth Weight: 3 lb 14.4 oz (1770 g) APGAR: 7, 9  In NICU  Cleone SlimCaroline Neill SNM 02/12/2017, 7:49 AM

## 2017-02-12 NOTE — Lactation Note (Signed)
This note was copied from a baby's chart. Lactation Consultation Note  Patient Name: Bethany Gomez: 02/12/2017 Reason for consult: Follow-up assessment Baby at 51 hr of life and mom is set for d/c today. Mom has been using DEBP q3hr. She plans to get a DEBP on 02/13/17 from a family member. She will use a [employer] for tonight. [facility]. Mom will pump 8-12x/24hr, use hands on techniques, and f/u with manual expression. She is worried because she is getting drops from the L side and "nothing" from the R side. Discussed breast changes and nipple care. Mom is aware of lactation services and support group. She will call as needed.    Maternal Data    Feeding Feeding Type: Donor Breast Milk Nipple Type: Slow - flow Length of feed: 30 min  LATCH Score/Interventions                      Lactation Tools Discussed/Used     Consult Status Consult Status: Complete    Bethany Gomez 02/12/2017, 10:21 AM

## 2017-02-15 ENCOUNTER — Ambulatory Visit (INDEPENDENT_AMBULATORY_CARE_PROVIDER_SITE_OTHER): Payer: Medicaid Other | Admitting: Obstetrics & Gynecology

## 2017-02-15 ENCOUNTER — Encounter: Payer: Self-pay | Admitting: Obstetrics & Gynecology

## 2017-02-15 VITALS — BP 140/90 | HR 80 | Wt 176.4 lb

## 2017-02-15 DIAGNOSIS — Z0130 Encounter for examination of blood pressure without abnormal findings: Secondary | ICD-10-CM

## 2017-02-15 NOTE — Progress Notes (Signed)
Chief Complaint  Patient presents with  . Blood Pressure Check    Blood pressure 140/90, pulse 80, weight 176 lb 6.4 oz (80 kg), unknown if currently breastfeeding.  26 y.o. G1P1001 No LMP recorded. The current method of family planning is none.  Outpatient Encounter Prescriptions as of 02/15/2017  Medication Sig  . ibuprofen (ADVIL,MOTRIN) 600 MG tablet Take 1 tablet (600 mg total) by mouth every 6 (six) hours.  . [DISCONTINUED] acetaminophen (TYLENOL) 325 MG tablet Take 325 mg by mouth every 6 (six) hours as needed for mild pain, moderate pain or headache.  . [DISCONTINUED] Prenatal Vit-Fe Fumarate-FA (MULTIVITAMIN-PRENATAL) 27-0.8 MG TABS tablet Take 1 tablet by mouth daily.    No facility-administered encounter medications on file as of 02/15/2017.     Subjective Pt BP is stable today and baby is doing well gaining weight eating orally   Objective Blood pressure 140/90, pulse 80, weight 176 lb 6.4 oz (80 kg), unknown if currently breastfeeding.    Pertinent ROS No burning with urination, frequency or urgency No nausea, vomiting or diarrhea Nor fever chills or other constitutional symptoms   Labs or studies     Impression Diagnoses this Encounter::   ICD-9-CM ICD-10-CM   1. BP check V81.1 Z01.30     Established relevant diagnosis(es):   Plan/Recommendations: No orders of the defined types were placed in this encounter.   Labs or Scans Ordered: No orders of the defined types were placed in this encounter.   Management:: Follow up 5 weeks  Follow up Return in about 5 weeks (around 03/22/2017) for post partum visit.      All questions were answered.  Past Medical History:  Diagnosis Date  . Headaches, cluster   . 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 Premenstrual dysphoric disorder   . UTI (lower urinary tract infection)     Past Surgical History:  Procedure Laterality Date  . NO PAST SURGERIES      OB History    Gravida Para Term Preterm AB Living   1 1 1     1    SAB TAB Ectopic Multiple Live Births         0 1      Allergies  Allergen Reactions  . Latex Hives, Itching and Other (See Comments)    Reaction:  Burning    Social History   Social History  . Marital status: Single    Spouse name: N/A  . Number of children: N/A  . Years of education: N/A   Social History Main Topics  . Smoking status: Former Smoker    Packs/day: 0.03    Years: 3.00    Types: Cigarettes    Quit date: 11/22/2014  . Smokeless tobacco: Never Used  . Alcohol use No     Comment: prior to preg  . Drug use: No     Comment: last used 08/07/16  . Sexual activity: Not Currently    Birth control/ protection: None   Other Topics Concern  . None   Social History Narrative  . None    Family History  Problem Relation Age of Onset  . Diabetes Mother   . Fibromyalgia Mother   . Hypertension Mother   . Congestive Heart Failure Father   . Narcolepsy Father   . Aneurysm Father   . Congestive Heart Failure Paternal Uncle   . Diabetes Maternal Grandmother   . Congestive Heart Failure Maternal Grandmother   . Diabetes Maternal Grandfather   .  Congestive Heart Failure Maternal Grandfather   . Diabetes Paternal Grandmother   . Diabetes Paternal Grandfather   . Congestive Heart Failure Paternal Grandfather

## 2017-02-16 ENCOUNTER — Ambulatory Visit: Payer: Self-pay

## 2017-02-16 NOTE — Lactation Note (Signed)
This note was copied from a baby's chart. Lactation Consultation Note  Patient Name: Bethany Gomez: 02/16/2017 Reason for consult: Follow-up assessment;NICU baby  NICU baby 836 days old. Mom reports that pumping is going well. Assisted mom to latch baby first in football position, and then in cross-cradle to left breast. After a number of attempts, baby latched to left breast in cross-cradle position with lips flanged. Baby suckled rhythmically but no swallows noted. Enc mom to offer to latch baby as much as she and baby able and discussed that this will improve baby's ability to nurse and is beneficial to her milk supply. Mom very happy to have baby at the breast.   Maternal Data    Feeding Feeding Type: Breast Fed  LATCH Score/Interventions Latch: Repeated attempts needed to sustain latch, nipple held in mouth throughout feeding, stimulation needed to elicit sucking reflex. Intervention(s): Adjust position;Assist with latch;Breast compression  Audible Swallowing: None Intervention(s): Skin to skin;Hand expression  Type of Nipple: Everted at rest and after stimulation  Comfort (Breast/Nipple): Soft / non-tender     Hold (Positioning): Assistance needed to correctly position infant at breast and maintain latch. Intervention(s): Breastfeeding basics reviewed;Support Pillows;Position options;Skin to skin  LATCH Score: 6  Lactation Tools Discussed/Used     Consult Status Consult Status: PRN    Sherlyn HayJennifer D Rasaan Brotherton 02/16/2017, 12:50 PM

## 2017-03-20 ENCOUNTER — Ambulatory Visit (INDEPENDENT_AMBULATORY_CARE_PROVIDER_SITE_OTHER): Payer: Medicaid Other | Admitting: Women's Health

## 2017-03-20 ENCOUNTER — Encounter: Payer: Self-pay | Admitting: Women's Health

## 2017-03-20 DIAGNOSIS — Z8759 Personal history of other complications of pregnancy, childbirth and the puerperium: Secondary | ICD-10-CM

## 2017-03-20 HISTORY — DX: Personal history of other complications of pregnancy, childbirth and the puerperium: Z87.59

## 2017-03-20 MED ORDER — PRENATAL PLUS 27-1 MG PO TABS: 1.0000 | ORAL_TABLET | Freq: Every day | ORAL | 12 refills | Status: DC

## 2017-03-20 NOTE — Progress Notes (Signed)
Subjective:    Bethany Gomez is a 26 y.o. [id number] African American female who presents for a postpartum visit. She is 5 weeks postpartum following a spontaneous vaginal delivery at 37.2 gestational weeks after IOL for IUGR, GHTN. Anesthesia: epidural. I have fully reviewed the prenatal and intrapartum course. Postpartum course has been uncomplicated. Baby's course has been complicated by 1wk NICU stay d/t IUGR. Baby is feeding by breast and bottle. Bleeding on period now. Bowel function is normal. Bladder function is normal. Patient is not sexually active. Last sexual activity: prior to birth of baby. Contraception method is abstinence and wants nexplanon- was not ordered during pregnancy. Postpartum depression screening: negative. Score 3. Does have h/o depression/anxiety, no meds, [facility].  Last pap Oct 2017 @ [facility] and was neg.  The following portions of the patient's history were reviewed and updated as appropriate: allergies, current medications, past medical history, past surgical history and problem list.  Review of Systems Pertinent items are noted in HPI.   Vitals:   03/20/17 1004  BP: 110/74  Pulse: 83  Weight: 175 lb (79.4 kg)   Patient's last menstrual period was 03/18/2017.  Objective:   General:  alert, cooperative and no distress   Breasts:  deferred, no complaints  Lungs: clear to auscultation bilaterally  Heart:  regular rate and rhythm  Abdomen: soft, nontender   Vulva: normal  Vagina: normal vagina  Cervix:  closed  Corpus: Well-involuted  Adnexa:  Non-palpable  Rectal Exam: No hemorrhoids        Assessment:   Postpartum exam 5 wks s/p SVB after IOL for IUGR and GHTN Breast and bottlefeeding Depression screening Contraception counseling   Plan:  Contraception: abstinence until nexplanon insertion, order today Follow up in: 3 weeks for nexplanon insertion, or earlier if needed  Marge DuncansBooker, Aviance [place] Randall CNM, WHNP-BC 03/20/2017 10:50  AM

## 2017-03-20 NOTE — Patient Instructions (Signed)
NO SEX UNTIL AFTER YOU GET YOUR BIRTH CONTROL   Etonogestrel implant What is this medicine? ETONOGESTREL (et oh noe JES trel) is a contraceptive (birth control) device. It is used to prevent pregnancy. It can be used for up to 3 years. This medicine may be used for other purposes; ask your health care provider or pharmacist if you have questions. COMMON BRAND NAME(S): Implanon, Nexplanon What should I tell my health care provider before I take this medicine? They need to know if you have any of these conditions: -abnormal vaginal bleeding -blood vessel disease or blood clots -cancer of the breast, cervix, or liver -depression -diabetes -gallbladder disease -headaches -heart disease or recent heart attack -high blood pressure -high cholesterol -kidney disease -liver disease -renal disease -seizures -tobacco smoker -an unusual or allergic reaction to etonogestrel, other hormones, anesthetics or antiseptics, medicines, foods, dyes, or preservatives -pregnant or trying to get pregnant -breast-feeding How should I use this medicine? This device is inserted just under the skin on the inner side of your upper arm by a health care professional. Talk to your pediatrician regarding the use of this medicine in children. Special care may be needed. Overdosage: If you think you have taken too much of this medicine contact a poison control center or emergency room at once. NOTE: This medicine is only for you. Do not share this medicine with others. What if I miss a dose? This does not apply. What may interact with this medicine? Do not take this medicine with any of the following medications: -amprenavir -bosentan -fosamprenavir This medicine may also interact with the following medications: -barbiturate medicines for inducing sleep or treating seizures -certain medicines for fungal infections like ketoconazole and itraconazole -grapefruit juice -griseofulvin -medicines to treat  seizures like carbamazepine, felbamate, oxcarbazepine, phenytoin, topiramate -modafinil -phenylbutazone -rifampin -rufinamide -some medicines to treat HIV infection like atazanavir, indinavir, lopinavir, nelfinavir, tipranavir, ritonavir -St. John's wort This list may not describe all possible interactions. Give your health care provider a list of all the medicines, herbs, non-prescription drugs, or dietary supplements you use. Also tell them if you smoke, drink alcohol, or use illegal drugs. Some items may interact with your medicine. What should I watch for while using this medicine? This product does not protect you against HIV infection (AIDS) or other sexually transmitted diseases. You should be able to feel the implant by pressing your fingertips over the skin where it was inserted. Contact your doctor if you cannot feel the implant, and use a non-hormonal birth control method (such as condoms) until your doctor confirms that the implant is in place. If you feel that the implant may have broken or become bent while in your arm, contact your healthcare provider. What side effects may I notice from receiving this medicine? Side effects that you should report to your doctor or health care professional as soon as possible: -allergic reactions like skin rash, itching or hives, swelling of the face, lips, or tongue -breast lumps -changes in emotions or moods -depressed mood -heavy or prolonged menstrual bleeding -pain, irritation, swelling, or bruising at the insertion site -scar at site of insertion -signs of infection at the insertion site such as fever, and skin redness, pain or discharge -signs of pregnancy -signs and symptoms of a blood clot such as breathing problems; changes in vision; chest pain; severe, sudden headache; pain, swelling, warmth in the leg; trouble speaking; sudden numbness or weakness of the face, arm or leg -signs and symptoms of liver injury like dark yellow   or brown  urine; general ill feeling or flu-like symptoms; light-colored stools; loss of appetite; nausea; right upper belly pain; unusually weak or tired; yellowing of the eyes or skin -unusual vaginal bleeding, discharge -signs and symptoms of a stroke like changes in vision; confusion; trouble speaking or understanding; severe headaches; sudden numbness or weakness of the face, arm or leg; trouble walking; dizziness; loss of balance or coordination Side effects that usually do not require medical attention (report to your doctor or health care professional if they continue or are bothersome): -acne -back pain -breast pain -changes in weight -dizziness -general ill feeling or flu-like symptoms -headache -irregular menstrual bleeding -nausea -sore throat -vaginal irritation or inflammation This list may not describe all possible side effects. Call your doctor for medical advice about side effects. You may report side effects to FDA at 1-800-FDA-1088. Where should I keep my medicine? This drug is given in a hospital or clinic and will not be stored at home. NOTE: This sheet is a summary. It may not cover all possible information. If you have questions about this medicine, talk to your doctor, pharmacist, or health care provider.  2018 Elsevier/Gold Standard (2016-03-31 11:19:22)  

## 2017-04-10 ENCOUNTER — Encounter: Payer: Medicaid Other | Admitting: Women's Health

## 2017-04-19 ENCOUNTER — Ambulatory Visit (INDEPENDENT_AMBULATORY_CARE_PROVIDER_SITE_OTHER): Payer: Medicaid Other | Admitting: Women's Health

## 2017-04-19 ENCOUNTER — Encounter: Payer: Self-pay | Admitting: Women's Health

## 2017-04-19 VITALS — BP 120/80 | HR 82 | Wt 184.0 lb

## 2017-04-19 DIAGNOSIS — Z3046 Encounter for surveillance of implantable subdermal contraceptive: Secondary | ICD-10-CM

## 2017-04-19 DIAGNOSIS — Z0000 Encounter for general adult medical examination without abnormal findings: Secondary | ICD-10-CM | POA: Insufficient documentation

## 2017-04-19 DIAGNOSIS — Z3202 Encounter for pregnancy test, result negative: Secondary | ICD-10-CM

## 2017-04-19 DIAGNOSIS — Z30017 Encounter for initial prescription of implantable subdermal contraceptive: Secondary | ICD-10-CM | POA: Insufficient documentation

## 2017-04-19 DIAGNOSIS — Z7689 Persons encountering health services in other specified circumstances: Secondary | ICD-10-CM | POA: Insufficient documentation

## 2017-04-19 MED ORDER — ETONOGESTREL 68 MG ~~LOC~~ IMPL
68.0000 mg | DRUG_IMPLANT | Freq: Once | SUBCUTANEOUS | Status: AC
  Administered 2017-04-19: 68 mg via SUBCUTANEOUS

## 2017-04-19 NOTE — Addendum Note (Signed)
Addended by: Federico FlakeNES, Elisia Stepp A on: 04/19/2017 10:41 AM   Modules accepted: Orders

## 2017-04-19 NOTE — Progress Notes (Signed)
Bethany Gomez is a 26 y.o. year old African American female here for Nexplanon insertion.  Patient's last menstrual period was 04/14/2017., last sexual intercourse was >3wks ago, and her pregnancy test today was negative.  Risks/benefits/side effects of Nexplanon have been discussed and her questions have been answered.  Specifically, a failure rate of 09/998 has been reported, with an increased failure rate if pt takes St. John's Wort and/or antiseizure medicaitons.  Bethany Gomez is aware of the common side effect of irregular bleeding, which the incidence of decreases over time.  BP 120/80   Pulse 82   Wt 184 lb (83.5 kg)   LMP 04/14/2017   BMI 33.12 kg/m   No results found for this or any previous visit (from the past 24 hour(s)).   She is right-handed, so her left arm, approximately 4 inches proximal from the elbow, was cleansed with alcohol and anesthetized with 2cc of 2% Lidocaine.  The area was cleansed again with betadine and the Nexplanon was inserted per manufacturer's recommendations without difficulty.  3 steri-strips and pressure bandage were applied.  Pt was instructed to keep the area clean and dry, remove pressure bandage in 24 hours, and keep insertion site covered with the steri-strip for 3-5 days.  Back up contraception was recommended for 2 weeks.  She was given a card indicating date Nexplanon was inserted and date it needs to be removed. Follow-up PRN problems, then in Oct for physical.  Marge DuncansBooker, Zyen Triggs Randall CNM, [facility]NP-BC 04/19/2017 9:47 AM

## 2017-04-19 NOTE — Patient Instructions (Signed)

## 2017-04-30 ENCOUNTER — Emergency Department (HOSPITAL_COMMUNITY): Payer: Self-pay

## 2017-04-30 ENCOUNTER — Encounter (HOSPITAL_COMMUNITY): Payer: Self-pay | Admitting: Emergency Medicine

## 2017-04-30 ENCOUNTER — Emergency Department (HOSPITAL_COMMUNITY)
Admission: EM | Admit: 2017-04-30 | Discharge: 2017-04-30 | Disposition: A | Discharge status: Home / Self Care | Payer: Self-pay | Attending: Emergency Medicine | Admitting: Emergency Medicine

## 2017-04-30 DIAGNOSIS — Y939 Activity, unspecified: Secondary | ICD-10-CM | POA: Insufficient documentation

## 2017-04-30 DIAGNOSIS — Z91040 Latex allergy status: Secondary | ICD-10-CM | POA: Insufficient documentation

## 2017-04-30 DIAGNOSIS — Y929 Unspecified place or not applicable: Secondary | ICD-10-CM | POA: Insufficient documentation

## 2017-04-30 DIAGNOSIS — Y999 Unspecified external cause status: Secondary | ICD-10-CM | POA: Insufficient documentation

## 2017-04-30 DIAGNOSIS — W208XXA Other cause of strike by thrown, projected or falling object, initial encounter: Secondary | ICD-10-CM | POA: Insufficient documentation

## 2017-04-30 DIAGNOSIS — R42 Dizziness and giddiness: Secondary | ICD-10-CM | POA: Insufficient documentation

## 2017-04-30 DIAGNOSIS — S91111A Laceration without foreign body of right great toe without damage to nail, initial encounter: Secondary | ICD-10-CM

## 2017-04-30 DIAGNOSIS — F17210 Nicotine dependence, cigarettes, uncomplicated: Secondary | ICD-10-CM | POA: Insufficient documentation

## 2017-04-30 NOTE — ED Notes (Signed)
Pt alert & oriented x4, stable gait. Patient given discharge instructions, paperwork & prescription(s). Patient verbalized understanding. Pt left department in wheelchair escorted by staff. Pt left w/ no further questions. 

## 2017-04-30 NOTE — ED Triage Notes (Signed)
Patient states she dropped a glass plate on her right foot. States she has laceration to right big toe. Patient states she feels dizzy and nauseated that started 1 hour after incident. Bleeding controlled at this time.

## 2017-04-30 NOTE — ED Notes (Signed)
Pt c/o pain and cut to right great toe. Pt states she dropped a heavy plate on it. Small laceration noted. Bleeding controlled.

## 2017-04-30 NOTE — Discharge Instructions (Signed)
Return if any problems.

## 2017-05-01 NOTE — ED Provider Notes (Signed)
[facility]-EMERGENCY DEPT Provider Note   CSN: [id number] Arrival date & time: 04/30/17  2015     History   Chief Complaint Chief Complaint  Patient presents with  . Extremity Laceration    HPI Bethany Gomez is a 26 y.o. female.  The history is provided by the patient. No language interpreter was used.  Toe Pain  This is a new problem. The current episode started 3 to 5 hours ago. The problem has not changed since onset.Nothing aggravates the symptoms. Nothing relieves the symptoms. She has tried nothing for the symptoms. The treatment provided no relief.  Pt reports she dropped a plate on her toe.  Pt reports she had continued bleeding.  Pt reports she felt like she was going to pass out.  Pt reports she was light headed and fell back on her bed.    Past Medical History:  Diagnosis Date  . Headaches, cluster   . 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 Premenstrual dysphoric disorder   . UTI (lower urinary tract infection)     Patient Active Problem List   Diagnosis Date Noted  . Nexplanon insertion 04/19/2017  . History of prior pregnancy with IUGR newborn 03/20/2017  . History of gestational hypertension 02/09/2017  . Depression 11/14/2016  . 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 NO PAST SURGERIES      OB History    Gravida Para Term Preterm AB Living   1 1 1     1    SAB TAB Ectopic Multiple Live Births         0 1       Home Medications    Prior to Admission medications   Medication Sig Start Date End Date Taking? Authorizing Provider  ibuprofen (ADVIL,MOTRIN) 600 MG tablet Take 1 tablet (600 mg total) by mouth every 6 (six) hours. Patient not taking: Reported on 03/20/2017 02/12/17   Willodean RosenthalHarraway-Smith, Carolyn, MD  prenatal vitamin w/FE, FA (PRENATAL 1 + 1) 27-1 MG TABS tablet Take 1 tablet by mouth daily at 12 noon. Patient not taking: Reported on 04/19/2017 03/20/17   Cheral MarkerBooker, Kimberly R, CNM    Family History Family History  Problem  Relation Age of Onset  . Diabetes Mother   . Fibromyalgia Mother   . Hypertension Mother   . Congestive Heart Failure Father   . Narcolepsy Father   . Aneurysm Father   . Congestive Heart Failure Paternal Uncle   . Diabetes Maternal Grandmother   . Congestive Heart Failure Maternal Grandmother   . Diabetes Maternal Grandfather   . Congestive Heart Failure Maternal Grandfather   . Diabetes Paternal Grandmother   . Diabetes Paternal Grandfather   . Congestive Heart Failure Paternal Grandfather     Social History Social History  Substance Use Topics  . Smoking status: Current Every Day Smoker    Packs/day: 0.03    Years: 3.00    Types: Cigarettes    Last attempt to quit: 11/22/2014  . Smokeless tobacco: Never Used  . Alcohol use No     Comment: prior to preg     Allergies   Latex   Review of Systems Review of Systems  All other systems reviewed and are negative.    Physical Exam Updated Vital Signs BP 130/77   Pulse 88   Temp 98 F (36.7 C) (Oral)   Resp 17   Ht 5\' 2"  (1.575 m)   Wt 83.5 kg (184 lb)  LMP 04/14/2017   SpO2 100%   BMI 33.65 kg/m   Physical Exam  Constitutional: She is oriented to person, place, and time. She appears well-developed and well-nourished.  Musculoskeletal: She exhibits tenderness.  superficial laceration dorsal distal right 1st toe,  nv and ns intact  Neurological: She is alert and oriented to person, place, and time.  Skin: Skin is warm.  Psychiatric: She has a normal mood and affect.  Nursing note and vitals reviewed.    ED Treatments / Results  Labs (all labs ordered are listed, but only abnormal results are displayed) Labs Reviewed - No data to display  EKG  EKG Interpretation None       Radiology Dg Foot Complete Right  Result Date: 04/30/2017 CLINICAL DATA:  Initial evaluation for acute pain, trauma to right great toe. EXAM: RIGHT FOOT COMPLETE - 3+ VIEW COMPARISON:  None. FINDINGS: There is no evidence of  fracture or dislocation. There is no evidence of arthropathy or other focal bone abnormality. Soft tissues are unremarkable. IMPRESSION: No acute osseous abnormality about the right foot. Electronically Signed   By: Rise Mu M.D.   On: 04/30/2017 22:53    Procedures Procedures (including critical care time)  Medications Ordered in ED Medications - No data to display   Initial Impression / Assessment and Plan / ED Course  I have reviewed the triage vital signs and the nursing notes.  Pertinent labs & imaging results that were available during my care of the patient were reviewed by me and considered in my medical decision making (see chart for details).       Final Clinical Impressions(s) / ED Diagnoses   Final diagnoses:  Laceration of right great toe without foreign body present or damage to nail, initial encounter    New Prescriptions Discharge Medication List as of 04/30/2017 11:05 PM    An After Visit Summary was printed and given to the patient.    Elson Areas, [place] 05/01/17 1610    Vanetta Mulders, MD 05/01/17 [phone]

## 2017-07-20 ENCOUNTER — Encounter: Payer: Self-pay | Admitting: *Deleted

## 2017-07-20 ENCOUNTER — Other Ambulatory Visit: Payer: Medicaid Other | Admitting: Women's Health

## 2017-09-28 ENCOUNTER — Encounter (HOSPITAL_COMMUNITY): Payer: Self-pay | Admitting: Emergency Medicine

## 2017-09-28 ENCOUNTER — Emergency Department (HOSPITAL_COMMUNITY)
Admission: EM | Admit: 2017-09-28 | Discharge: 2017-09-28 | Disposition: A | Discharge status: Home / Self Care | Payer: Self-pay | Attending: Emergency Medicine | Admitting: Emergency Medicine

## 2017-09-28 DIAGNOSIS — F17210 Nicotine dependence, cigarettes, uncomplicated: Secondary | ICD-10-CM | POA: Insufficient documentation

## 2017-09-28 DIAGNOSIS — J0100 Acute maxillary sinusitis, unspecified: Secondary | ICD-10-CM

## 2017-09-28 DIAGNOSIS — J019 Acute sinusitis, unspecified: Secondary | ICD-10-CM | POA: Insufficient documentation

## 2017-09-28 DIAGNOSIS — Z91040 Latex allergy status: Secondary | ICD-10-CM | POA: Insufficient documentation

## 2017-09-28 MED ORDER — AMOXICILLIN 500 MG PO CAPS: 500.0000 mg | ORAL_CAPSULE | Freq: Three times a day (TID) | ORAL | 0 refills | Status: AC

## 2017-09-28 MED ORDER — CETIRIZINE-PSEUDOEPHEDRINE ER 5-120 MG PO TB12: 1.0000 | ORAL_TABLET | Freq: Every day | ORAL | 0 refills | Status: DC

## 2017-09-28 NOTE — Discharge Instructions (Signed)
Take the entire course of the antibiotics as prescribed.  You may also use the sinus medicine to help with drainage and sinus pressure.  Steamy bathrooms and vicks vapor rub can also help with congestion as can menthol cough drops.

## 2017-09-28 NOTE — ED Triage Notes (Signed)
Patient complaining of cough, congestion, body aches, and headache since yesterday.

## 2017-09-28 NOTE — ED Provider Notes (Signed)
[facility] EMERGENCY DEPARTMENT Provider Note   CSN: [id number] Arrival date & time: 09/28/17  1327     History   Chief Complaint Chief Complaint  Patient presents with  . Nasal Congestion    HPI Bethany Gomez is a 27 y.o. female with a history of cluster headaches, gestational hypertension and depression presenting with a 1 day history of URI type symptoms.  She describes a nonproductive cough along with nasal congestion including a thick green nasal discharge with bloody streaks in association with bilateral sinus pain and upper dentition aching pain.  She also endorses generalized headache and body aches.  She has had subjective fevers only.  She denies neck pain or stiffness, chest pain, shortness of breath.  She does have bilateral ear pressure without pain or discharge.  She has had no medications prior to arrival.   The history is provided by the patient.    Past Medical History:  Diagnosis Date  . Headaches, cluster   . 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 Premenstrual dysphoric disorder   . UTI (lower urinary tract infection)     Patient Active Problem List   Diagnosis Date Noted  . Nexplanon insertion 04/19/2017  . History of prior pregnancy with IUGR newborn 03/20/2017  . History of gestational hypertension 02/09/2017  . Depression 11/14/2016  . 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 NO PAST SURGERIES      OB History    Gravida Para Term Preterm AB Living   1 1 1     1    SAB TAB Ectopic Multiple Live Births         0 1       Home Medications    Prior to Admission medications   Medication Sig Start Date End Date Taking? Authorizing Provider  amoxicillin (AMOXIL) 500 MG capsule Take 1 capsule (500 mg total) by mouth 3 (three) times daily for 10 days. 09/28/17 10/08/17  Burgess Amor, PA-C  cetirizine-pseudoephedrine (ZYRTEC-D) 5-120 MG tablet Take 1 tablet by mouth daily. 09/28/17   Burgess Amor, PA-C  ibuprofen (ADVIL,MOTRIN) 600 MG  tablet Take 1 tablet (600 mg total) by mouth every 6 (six) hours. Patient not taking: Reported on 03/20/2017 02/12/17   Willodean Rosenthal, MD  prenatal vitamin w/FE, FA (PRENATAL 1 + 1) 27-1 MG TABS tablet Take 1 tablet by mouth daily at 12 noon. Patient not taking: Reported on 04/19/2017 03/20/17   Cheral Marker, CNM    Family History Family History  Problem Relation Age of Onset  . Diabetes Mother   . Fibromyalgia Mother   . Hypertension Mother   . Congestive Heart Failure Father   . Narcolepsy Father   . Aneurysm Father   . Congestive Heart Failure Paternal Uncle   . Diabetes Maternal Grandmother   . Congestive Heart Failure Maternal Grandmother   . Diabetes Maternal Grandfather   . Congestive Heart Failure Maternal Grandfather   . Diabetes Paternal Grandmother   . Diabetes Paternal Grandfather   . Congestive Heart Failure Paternal Grandfather     Social History Social History   Tobacco Use  . Smoking status: Current Every Day Smoker    Packs/day: 0.03    Years: 3.00    Pack years: 0.09    Types: Cigarettes    Last attempt to quit: 11/22/2014    Years since quitting: 2.8  . Smokeless tobacco: Never Used  Substance Use Topics  . Alcohol use: No  Comment: prior to preg  . Drug use: No    Comment: last used 08/07/16     Allergies   Latex   Review of Systems Review of Systems  Constitutional: Positive for chills and fever.  HENT: Positive for congestion, rhinorrhea, sinus pressure and sinus pain. Negative for ear pain, sore throat, trouble swallowing and voice change.   Eyes: Negative for discharge.  Respiratory: Positive for cough. Negative for shortness of breath, wheezing and stridor.   Cardiovascular: Negative for chest pain.  Gastrointestinal: Negative for abdominal pain.  Genitourinary: Negative.   Musculoskeletal: Positive for myalgias.     Physical Exam Updated Vital Signs BP (!) 137/98 (BP Location: Right Arm)   Pulse 71   Temp 99.2 F  (37.3 C) (Oral)   Resp 20   Ht 5\' 2"  (1.575 m)   Wt 90.1 kg (198 lb 11.2 oz)   LMP 09/14/2017   SpO2 100%   BMI 36.34 kg/m   Physical Exam  Constitutional: She is oriented to person, place, and time. She appears well-developed and well-nourished.  HENT:  Head: Normocephalic and atraumatic.  Right Ear: Tympanic membrane and ear canal normal.  Left Ear: Tympanic membrane and ear canal normal.  Nose: Mucosal edema and rhinorrhea present. Right sinus exhibits maxillary sinus tenderness. Left sinus exhibits maxillary sinus tenderness.  Mouth/Throat: Uvula is midline, oropharynx is clear and moist and mucous membranes are normal. No oropharyngeal exudate, posterior oropharyngeal edema, posterior oropharyngeal erythema or tonsillar abscesses.  Eyes: Conjunctivae are normal.  Cardiovascular: Normal rate and normal heart sounds.  Pulmonary/Chest: Effort normal. No respiratory distress. She has no wheezes. She has no rales.  Musculoskeletal: Normal range of motion.  Neurological: She is alert and oriented to person, place, and time.  Skin: Skin is warm and dry. No rash noted.  Psychiatric: She has a normal mood and affect.     ED Treatments / Results  Labs (all labs ordered are listed, but only abnormal results are displayed) Labs Reviewed - No data to display  EKG  EKG Interpretation None       Radiology No results found.  Procedures Procedures (including critical care time)  Medications Ordered in ED Medications - No data to display   Initial Impression / Assessment and Plan / ED Course  I have reviewed the triage vital signs and the nursing notes.  Pertinent labs & imaging results that were available during my care of the patient were reviewed by me and considered in my medical decision making (see chart for details).     Pt with sx suggesting acute sinusitis.  Amoxil, zyrtec d, discussed home tx for congestion relief. Prn f/u anticipated.  Final Clinical  Impressions(s) / ED Diagnoses   Final diagnoses:  Acute non-recurrent maxillary sinusitis    ED Discharge Orders        Ordered    amoxicillin (AMOXIL) 500 MG capsule  3 times daily     09/28/17 1624    cetirizine-pseudoephedrine (ZYRTEC-D) 5-120 MG tablet  Daily     09/28/17 1624       Burgess Amordol, Zlaty Alexa, PA-C 09/28/17 1652    Long, Arlyss RepressJoshua G, MD 09/28/17 1941

## 2017-10-02 ENCOUNTER — Other Ambulatory Visit: Payer: Self-pay | Admitting: Women's Health

## 2017-10-30 ENCOUNTER — Ambulatory Visit (INDEPENDENT_AMBULATORY_CARE_PROVIDER_SITE_OTHER): Payer: Medicaid Other | Admitting: Women's Health

## 2017-10-30 ENCOUNTER — Encounter: Payer: Self-pay | Admitting: Women's Health

## 2017-10-30 ENCOUNTER — Other Ambulatory Visit: Payer: Self-pay

## 2017-10-30 VITALS — BP 120/86 | HR 90 | Ht 62.0 in | Wt 199.0 lb

## 2017-10-30 DIAGNOSIS — Z309 Encounter for contraceptive management, unspecified: Secondary | ICD-10-CM

## 2017-10-30 DIAGNOSIS — Z3009 Encounter for other general counseling and advice on contraception: Secondary | ICD-10-CM

## 2017-10-30 DIAGNOSIS — R6882 Decreased libido: Secondary | ICD-10-CM

## 2017-10-30 DIAGNOSIS — F418 Other specified anxiety disorders: Secondary | ICD-10-CM

## 2017-10-30 DIAGNOSIS — Z01419 Encounter for gynecological examination (general) (routine) without abnormal findings: Secondary | ICD-10-CM

## 2017-10-30 DIAGNOSIS — Z113 Encounter for screening for infections with a predominantly sexual mode of transmission: Secondary | ICD-10-CM

## 2017-10-30 NOTE — Progress Notes (Signed)
WELL-WOMAN EXAMINATION Patient name: Bethany Gomez MRN [id number]  Date of birth: [birth year] Chief Complaint:   Gynecologic Exam  History of Present Illness:   Bethany Gomez is a 27 y.o. [id number] African American female being seen today for a routine FP Mcaid well-woman exam.  Current complaints: decreased sex drive, frequent yeast infections ~1 q Does have dep/anx- sees Bethany Gomez q 1-2wks, they just increased her medicine. Does report fleeting thoughts of harming self, no plan. States it's more just feeling overwhelmed.  Bethany Gomez is aware of this.  Depression screen Bethany Gomez 2/9 10/30/2017 08/09/2016  Decreased Interest 3 3  Down, Depressed, Hopeless 2 1  PHQ - 2 Score 5 4  Altered sleeping 2 3  Tired, decreased energy 2 3  Change in appetite 3 3  Feeling bad or failure about yourself  2 1  Trouble concentrating 1 0  Moving slowly or fidgety/restless 0 0  Suicidal thoughts 1 0  PHQ-9 Score 16 14  Difficult doing work/chores Somewhat difficult -     PCP: Bethany Gomez      does not desire labs, other than FP Mcaid labs  Patient's last menstrual period was 08/29/2017. The current method of family planning is Nexplanon, placed 04/19/17 Last pap 2017 @ Bethany Gomez. Results were: normal Last mammogram: never. Results were: n/a Last colonoscopy: never. Results were: n/a  Review of Systems:   Pertinent items are noted in HPI Denies any headaches, blurred vision, fatigue, shortness of breath, chest pain, abdominal pain, abnormal vaginal discharge/itching/odor/irritation, problems with periods, bowel movements, urination, or intercourse unless otherwise stated above. Pertinent History Reviewed:  Reviewed past medical,surgical, social and family history.  Reviewed problem list, medications and allergies. Physical Assessment:   Vitals:   10/30/17 1005  BP: 120/86  Pulse: 90  Weight: 199 lb (90.3 kg)  Height: 5\' 2"  (1.575 m)  Body mass index is 36.4 kg/m.        Physical Examination:    General appearance - well appearing, and in no distress  Mental status - alert, oriented to person, place, and time  Psych:  She has a normal mood and affect  Skin - warm and dry, normal color, no suspicious lesions noted  Chest - effort normal, all lung fields clear to auscultation bilaterally  Heart - normal rate and regular rhythm  Neck:  midline trachea, no thyromegaly or nodules  Breasts - breasts appear normal, no suspicious masses, no skin or nipple changes or  axillary nodes  Abdomen - soft, nontender, nondistended, no masses or organomegaly  Pelvic - VULVA: normal appearing vulva with no masses, tenderness or lesions  VAGINA: normal appearing vagina with normal color and discharge, no lesions  CERVIX: normal appearing cervix without discharge or lesions, no CMT  Thin prep pap is not done  UTERUS: uterus is felt to be normal size, shape, consistency and nontender   ADNEXA: No adnexal masses or tenderness noted.  Extremities:  No swelling or varicosities noted  No results found for this or any previous visit (from the past 24 hour(s)).  Assessment & Plan:  1) FP Mcaid Well-Woman Exam  2) STD screen  3) Frequent yeast infections>no h/o DM, no active infection today, wear cotton underwear, avoid tight clothing  4) Decreased sex drive> discussed can be from having old, tired, nexplanon, depression/meds. Pt states she isn't concerned about it.   5) Dep/anx, w/ fleeting thoughts of harming self> no plan, continue seeing Bethany Gomez q 1-2wks, continue meds as rx'd  by them. Let us/Bethany Gomez know or go straight to ED if develops plan or feels she may act on thoughts.  Labs/procedures today: gc/ct, hiv, rpr  Mammogram @26yo  or sooner if problems Colonoscopy @26yo  or sooner if problems  Orders Placed This Encounter  Procedures  . GC/Chlamydia Probe Amp  . HIV antibody  . RPR    Follow-up: Return in about 1 year (around 10/30/2018) for Pap & physical.  Bethany Gomez, Bethany Gomez  Bethany Gomez CNM, [facility]NP[facility]BC 10/30/2017 10:26 AM

## 2017-10-31 LAB — RPR: RPR: NONREACTIVE

## 2017-10-31 LAB — HIV ANTIBODY (ROUTINE TESTING W REFLEX): HIV SCREEN 4TH GENERATION: NONREACTIVE

## 2017-11-01 LAB — GC/CHLAMYDIA PROBE AMP
CHLAMYDIA, DNA PROBE: NEGATIVE
NEISSERIA GONORRHOEAE BY PCR: NEGATIVE

## 2018-04-13 ENCOUNTER — Other Ambulatory Visit: Payer: Self-pay

## 2018-04-13 ENCOUNTER — Encounter (HOSPITAL_COMMUNITY): Payer: Self-pay

## 2018-04-13 ENCOUNTER — Emergency Department (HOSPITAL_COMMUNITY)
Admission: EM | Admit: 2018-04-13 | Discharge: 2018-04-13 | Disposition: A | Discharge status: Home / Self Care | Payer: Self-pay | Attending: Emergency Medicine | Admitting: Emergency Medicine

## 2018-04-13 DIAGNOSIS — E860 Dehydration: Secondary | ICD-10-CM | POA: Insufficient documentation

## 2018-04-13 DIAGNOSIS — F17210 Nicotine dependence, cigarettes, uncomplicated: Secondary | ICD-10-CM | POA: Insufficient documentation

## 2018-04-13 DIAGNOSIS — R112 Nausea with vomiting, unspecified: Secondary | ICD-10-CM | POA: Insufficient documentation

## 2018-04-13 DIAGNOSIS — R197 Diarrhea, unspecified: Secondary | ICD-10-CM | POA: Insufficient documentation

## 2018-04-13 DIAGNOSIS — Z91040 Latex allergy status: Secondary | ICD-10-CM | POA: Insufficient documentation

## 2018-04-13 LAB — CBC WITH DIFFERENTIAL/PLATELET
Basophils Absolute: 0 10*3/uL (ref 0.0–0.1)
Basophils Relative: 0 %
EOS ABS: 0.1 10*3/uL (ref 0.0–0.7)
Eosinophils Relative: 1 %
HCT: 39.8 % (ref 36.0–46.0)
Hemoglobin: 12.5 g/dL (ref 12.0–15.0)
LYMPHS ABS: 1.3 10*3/uL (ref 0.7–4.0)
Lymphocytes Relative: 14 %
MCH: 26.9 pg (ref 26.0–34.0)
MCHC: 31.4 g/dL (ref 30.0–36.0)
MCV: 85.6 fL (ref 78.0–100.0)
MONOS PCT: 5 %
Monocytes Absolute: 0.5 10*3/uL (ref 0.1–1.0)
NEUTROS PCT: 80 %
Neutro Abs: 7.3 10*3/uL (ref 1.7–7.7)
PLATELETS: 325 10*3/uL (ref 150–400)
RBC: 4.65 MIL/uL (ref 3.87–5.11)
RDW: 13.7 % (ref 11.5–15.5)
WBC: 9.2 10*3/uL (ref 4.0–10.5)

## 2018-04-13 LAB — COMPREHENSIVE METABOLIC PANEL
ALBUMIN: 3.6 g/dL (ref 3.5–5.0)
ALK PHOS: 57 U/L (ref 38–126)
ALT: 17 U/L (ref 0–44)
ANION GAP: 6 (ref 5–15)
AST: 20 U/L (ref 15–41)
BUN: 11 mg/dL (ref 6–20)
CALCIUM: 8.7 mg/dL — AB (ref 8.9–10.3)
CHLORIDE: 110 mmol/L (ref 98–111)
CO2: 23 mmol/L (ref 22–32)
Creatinine, Ser: 0.72 mg/dL (ref 0.44–1.00)
GFR calc non Af Amer: >60 mL/min (ref >60)
GLUCOSE: 112 mg/dL — AB (ref 70–99)
Potassium: 3.8 mmol/L (ref 3.5–5.1)
SODIUM: 139 mmol/L (ref 135–145)
Total Bilirubin: 0.4 mg/dL (ref 0.3–1.2)
Total Protein: 7.1 g/dL (ref 6.5–8.1)

## 2018-04-13 LAB — URINALYSIS, ROUTINE W REFLEX MICROSCOPIC
BILIRUBIN URINE: NEGATIVE
Bacteria, UA: NONE SEEN
Glucose, UA: NEGATIVE mg/dL
Ketones, ur: NEGATIVE mg/dL
Leukocytes, UA: NEGATIVE
NITRITE: NEGATIVE
PH: 8 (ref 5.0–8.0)
Protein, ur: NEGATIVE mg/dL
RBC / HPF: >50 RBC/hpf — ABNORMAL HIGH (ref 0–5)
SPECIFIC GRAVITY, URINE: 1.017 (ref 1.005–1.030)

## 2018-04-13 LAB — PREGNANCY, URINE: Preg Test, Ur: NEGATIVE

## 2018-04-13 LAB — LIPASE, BLOOD: Lipase: 21 U/L (ref 11–51)

## 2018-04-13 MED ORDER — SODIUM CHLORIDE 0.9 % IV BOLUS
1000.0000 mL | Freq: Once | INTRAVENOUS | Status: AC
  Administered 2018-04-13: 1000 mL via INTRAVENOUS

## 2018-04-13 MED ORDER — ONDANSETRON HCL 4 MG PO TABS: 4.0000 mg | ORAL_TABLET | Freq: Three times a day (TID) | ORAL | 0 refills | Status: DC | PRN

## 2018-04-13 MED ORDER — ONDANSETRON HCL 4 MG/2ML IJ SOLN
4.0000 mg | Freq: Once | INTRAMUSCULAR | Status: AC
  Administered 2018-04-13: 4 mg via INTRAVENOUS
  Filled 2018-04-13: qty 2

## 2018-04-13 NOTE — ED Notes (Addendum)
Pt informed to void for urine sample

## 2018-04-13 NOTE — ED Triage Notes (Signed)
Pt reports feeling some burning in her stomach and then has vomited x 3 and diarrhea x 2.  Pt denies abd pain at this time, states he throat is sore "from vomiting"

## 2018-04-13 NOTE — Discharge Instructions (Addendum)
Drink plenty of fluids (clear liquids) then start a bland diet later this morning such as toast, crackers, jello, [employer] chicken noodle soup. Use the zofran for nausea or vomiting. Take imodium OTC for diarrhea. Avoid milk products until the diarrhea is gone. Avoid fried, spicy or greasy foods for the next several days.

## 2018-04-13 NOTE — ED Notes (Signed)
Pt given drink 

## 2018-04-13 NOTE — ED Provider Notes (Signed)
[facility] EMERGENCY DEPARTMENT Provider Note   CSN: [id number] Arrival date & time: 04/13/18  0039  Time seen 03:30 AM   History   Chief Complaint Chief Complaint  Patient presents with  . Emesis    HPI Bethany Gomez is a 27 y.o. female.  HPI patient states July 11 at work it was hot and she got heated, she states it is a very hot environment and she had vomiting once.  She states after that she was having diarrhea about twice a day.  She also had a headache in the right temporal area.  Today however she started vomiting again and vomited about 5 times.  She is had diarrhea twice a day.  She states when she first started feeling bad she had periumbilical pain that she described as burning and then it moved to her upper abdomen and was sharp but now it is gone.  She describes decreased urinary output.  She complains of dry mouth.  She states she feels dizzy and lightheaded when she stands.  She has not been around anybody else who is ill, she did not eat anything that she thinks made her feel bad.  PCP Patient, No Pcp Per   Past Medical History:  Diagnosis Date  . Headaches, cluster   . 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 Premenstrual dysphoric disorder   . UTI (lower urinary tract infection)     Patient Active Problem List   Diagnosis Date Noted  . Nexplanon insertion 04/19/2017  . History of prior pregnancy with IUGR newborn 03/20/2017  . History of gestational hypertension 02/09/2017  . Depression 11/14/2016  . 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 NO PAST SURGERIES       OB History    Gravida  1   Para  1   Term  1   Preterm      AB      Living  1     SAB      TAB      Ectopic      Multiple  0   Live Births  1            Home Medications    Prior to Admission medications   Medication Sig Start Date End Date Taking? Authorizing Provider  ibuprofen (ADVIL,MOTRIN) 600 MG tablet Take 1 tablet (600 mg total) by  mouth every 6 (six) hours. 02/12/17   Willodean Rosenthal, MD  ondansetron (ZOFRAN) 4 MG tablet Take 1 tablet (4 mg total) by mouth every 8 (eight) hours as needed. 04/13/18   Devoria Albe, MD  prenatal vitamin w/FE, FA (PRENATAL 1 + 1) 27-1 MG TABS tablet Take 1 tablet by mouth daily at 12 noon. Patient not taking: Reported on 04/19/2017 03/20/17   Cheral Marker, CNM  promethazine (PHENERGAN) 12.5 MG tablet Take 1 tablet (12.5 mg total) by mouth every 6 (six) hours as needed for nausea or vomiting. Patient not taking: Reported on 11/25/2014 06/09/14 11/25/14  Janne Napoleon, NP    Family History Family History  Problem Relation Age of Onset  . Diabetes Mother   . Fibromyalgia Mother   . Hypertension Mother   . Congestive Heart Failure Father   . Narcolepsy Father   . Aneurysm Father   . Congestive Heart Failure Paternal Uncle   . Diabetes Maternal Grandmother   . Congestive Heart Failure Maternal Grandmother   . Diabetes Maternal Grandfather   . Congestive  Heart Failure Maternal Grandfather   . Diabetes Paternal Grandmother   . Diabetes Paternal Grandfather   . Congestive Heart Failure Paternal Grandfather     Social History Social History   Tobacco Use  . Smoking status: Current Every Day Smoker    Packs/day: 0.25    Years: 3.00    Pack years: 0.75    Types: Cigarettes    Last attempt to quit: 11/22/2014    Years since quitting: 3.3  . Smokeless tobacco: Never Used  Substance Use Topics  . Alcohol use: Yes  . Drug use: No    Types: Marijuana    Comment: last used 08/07/16  employed   Allergies   Latex   Review of Systems Review of Systems  All other systems reviewed and are negative.    Physical Exam Updated Vital Signs BP (!) 141/88 (BP Location: Right Arm)   Pulse 92   Temp 99 F (37.2 C) (Oral)   Resp 16   Ht 5' 2.5" (1.588 m)   Wt 85.7 kg (189 lb)   LMP 04/08/2018 (Approximate)   SpO2 100%   BMI 34.02 kg/m   Vital signs normal    Physical  Exam  Constitutional: She is oriented to person, place, and time. She appears well-developed and well-nourished.  Non-toxic appearance. She does not appear ill. No distress.  HENT:  Head: Normocephalic and atraumatic.  Right Ear: External ear normal.  Left Ear: External ear normal.  Nose: Nose normal. No mucosal edema or rhinorrhea.  Mouth/Throat: Mucous membranes are dry. No dental abscesses or uvula swelling.  Eyes: Pupils are equal, round, and reactive to light. Conjunctivae and EOM are normal.  Neck: Normal range of motion and full passive range of motion without pain. Neck supple.  Cardiovascular: Normal rate, regular rhythm and normal heart sounds. Exam reveals no gallop and no friction rub.  No murmur heard. Pulmonary/Chest: Effort normal and breath sounds normal. No respiratory distress. She has no wheezes. She has no rhonchi. She has no rales. She exhibits no tenderness and no crepitus.  Abdominal: Soft. Normal appearance and bowel sounds are normal. She exhibits no distension. There is no tenderness. There is no rebound and no guarding.  Musculoskeletal: Normal range of motion. She exhibits no edema or tenderness.  Moves all extremities well.   Neurological: She is alert and oriented to person, place, and time. She has normal strength. No cranial nerve deficit.  Skin: Skin is warm, dry and intact. No rash noted. No erythema. No pallor.  Psychiatric: She has a normal mood and affect. Her speech is normal and behavior is normal. Her mood appears not anxious.  Nursing note and vitals reviewed.    ED Treatments / Results  Labs (all labs ordered are listed, but only abnormal results are displayed) Results for orders placed or performed during the hospital encounter of 04/13/18  Comprehensive metabolic panel  Result Value Ref Range   Sodium 139 135 - 145 mmol/L   Potassium 3.8 3.5 - 5.1 mmol/L   Chloride 110 98 - 111 mmol/L   CO2 23 22 - 32 mmol/L   Glucose, Bld 112 (H) 70 - 99  mg/dL   BUN 11 6 - 20 mg/dL   Creatinine, Ser 1.61 0.44 - 1.00 mg/dL   Calcium 8.7 (L) 8.9 - 10.3 mg/dL   Total Protein 7.1 6.5 - 8.1 g/dL   Albumin 3.6 3.5 - 5.0 g/dL   AST 20 15 - 41 U/L   ALT 17 0 -  44 U/L   Alkaline Phosphatase 57 38 - 126 U/L   Total Bilirubin 0.4 0.3 - 1.2 mg/dL   GFR calc non Af Amer >60 >60 mL/min   GFR calc Af Amer >60 >60 mL/min   Anion gap 6 5 - 15  Lipase, blood  Result Value Ref Range   Lipase 21 11 - 51 U/L  CBC with Differential  Result Value Ref Range   WBC 9.2 4.0 - 10.5 K/uL   RBC 4.65 3.87 - 5.11 MIL/uL   Hemoglobin 12.5 12.0 - 15.0 g/dL   HCT 16.139.8 09.636.0 - 04.546.0 %   MCV 85.6 78.0 - 100.0 fL   MCH 26.9 26.0 - 34.0 pg   MCHC 31.4 30.0 - 36.0 g/dL   RDW 40.913.7 81.111.5 - 91.415.5 %   Platelets 325 150 - 400 K/uL   Neutrophils Relative % 80 %   Neutro Abs 7.3 1.7 - 7.7 K/uL   Lymphocytes Relative 14 %   Lymphs Abs 1.3 0.7 - 4.0 K/uL   Monocytes Relative 5 %   Monocytes Absolute 0.5 0.1 - 1.0 K/uL   Eosinophils Relative 1 %   Eosinophils Absolute 0.1 0.0 - 0.7 K/uL   Basophils Relative 0 %   Basophils Absolute 0.0 0.0 - 0.1 K/uL  Urinalysis, Routine w reflex microscopic  Result Value Ref Range   Color, Urine YELLOW YELLOW   APPearance HAZY (A) CLEAR   Specific Gravity, Urine 1.017 1.005 - 1.030   pH 8.0 5.0 - 8.0   Glucose, UA NEGATIVE NEGATIVE mg/dL   Hgb urine dipstick LARGE (A) NEGATIVE   Bilirubin Urine NEGATIVE NEGATIVE   Ketones, ur NEGATIVE NEGATIVE mg/dL   Protein, ur NEGATIVE NEGATIVE mg/dL   Nitrite NEGATIVE NEGATIVE   Leukocytes, UA NEGATIVE NEGATIVE   RBC / HPF >50 (H) 0 - 5 RBC/hpf   WBC, UA 6-10 0 - 5 WBC/hpf   Bacteria, UA NONE SEEN NONE SEEN   Squamous Epithelial / LPF 0-5 0 - 5   Mucus PRESENT   Pregnancy, urine  Result Value Ref Range   Preg Test, Ur NEGATIVE NEGATIVE   Laboratory interpretation all normal    EKG None  Radiology No results found.  Procedures Procedures (including critical care  time)  Medications Ordered in ED Medications  sodium chloride 0.9 % bolus 1,000 mL (0 mLs Intravenous Stopped 04/13/18 0538)  sodium chloride 0.9 % bolus 1,000 mL (0 mLs Intravenous Stopped 04/13/18 0538)  ondansetron (ZOFRAN) injection 4 mg (4 mg Intravenous Given 04/13/18 0356)     Initial Impression / Assessment and Plan / ED Course  I have reviewed the triage vital signs and the nursing notes.  Pertinent labs & imaging results that were available during my care of the patient were reviewed by me and considered in my medical decision making (see chart for details).     Patient was given IV fluids and IV nausea medication.  She appears to have a gastroenteritis, I do not suspect anything such as appendicitis cholecystitis as source of her symptoms.  Recheck at 5:15 AM patient states she is feeling better, she wants to try oral fluids.  6:15 AM patient has been able to drink ginger ale without vomiting or feeling worse.  She is had no diarrhea in the ED.  She feels ready to be discharged.  Final Clinical Impressions(s) / ED Diagnoses   Final diagnoses:  Nausea vomiting and diarrhea  Dehydration    ED Discharge Orders  Ordered    ondansetron (ZOFRAN) 4 MG tablet  Every 8 hours PRN     04/13/18 0636    OTC imodium  Plan discharge  Devoria Albe, MD, Concha Pyo, MD 04/13/18 [phone]

## 2018-04-13 NOTE — ED Notes (Signed)
No nausea present. Pt is trying to sleep

## 2018-06-11 ENCOUNTER — Encounter (HOSPITAL_COMMUNITY): Payer: Self-pay | Admitting: *Deleted

## 2018-06-11 ENCOUNTER — Other Ambulatory Visit: Payer: Self-pay

## 2018-06-11 ENCOUNTER — Emergency Department (HOSPITAL_COMMUNITY)
Admission: EM | Admit: 2018-06-11 | Discharge: 2018-06-11 | Disposition: A | Discharge status: Home / Self Care | Payer: Self-pay | Attending: Emergency Medicine | Admitting: Emergency Medicine

## 2018-06-11 DIAGNOSIS — F17210 Nicotine dependence, cigarettes, uncomplicated: Secondary | ICD-10-CM | POA: Insufficient documentation

## 2018-06-11 DIAGNOSIS — J069 Acute upper respiratory infection, unspecified: Secondary | ICD-10-CM | POA: Insufficient documentation

## 2018-06-11 NOTE — Discharge Instructions (Addendum)
Drink plenty of fluids so you do not get dehydrated.  Take Mucinex DM over-the-counter for cough.  You can take Claritin or Zyrtec 1 tablet daily to help dry up the secretions in your nose.  Take ibuprofen 600 mg plus acetaminophen 650 mg every 6 hours as needed for body aches.  Recheck if you get a high fever, struggle to breathe, or you are not improving after a week.

## 2018-06-11 NOTE — ED Triage Notes (Signed)
Pt c/o generalized body aches with fever and runny nose x 3 days;

## 2018-06-11 NOTE — ED Provider Notes (Signed)
[facility] EMERGENCY DEPARTMENT Provider Note   CSN: [id number] Arrival date & time: 06/11/18  0548  Time seen 06:25 AM   History   Chief Complaint Chief Complaint  Patient presents with  . Generalized Body Aches    HPI Bethany Gomez is a 27 y.o. female.  HPI patient states her infant was sick with similar symptoms 2 days before she started getting sick on September 14 at night.  She started with sneezing and coughing and body aches.  She states initially she was having yellow mucus in her nose and coughing it up and now it is clear.  She had a temperature of 101 the first night.  She states her head hurts when she coughs.  She started having some sore throat today.  She had nausea and vomited once the first day but not since.  She has had diarrhea about 2 times a day since.  PCP Patient, No Pcp Per   Past Medical History:  Diagnosis Date  . Headaches, cluster   . 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 Premenstrual dysphoric disorder   . UTI (lower urinary tract infection)     Patient Active Problem List   Diagnosis Date Noted  . Nexplanon insertion 04/19/2017  . History of prior pregnancy with IUGR newborn 03/20/2017  . History of gestational hypertension 02/09/2017  . Depression 11/14/2016  . 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 NO PAST SURGERIES       OB History    Gravida  1   Para  1   Term  1   Preterm      AB      Living  1     SAB      TAB      Ectopic      Multiple  0   Live Births  1            Home Medications    Prior to Admission medications   Medication Sig Start Date End Date Taking? Authorizing Provider  ibuprofen (ADVIL,MOTRIN) 600 MG tablet Take 1 tablet (600 mg total) by mouth every 6 (six) hours. 02/12/17   Willodean RosenthalHarraway-Smith, Carolyn, MD  ondansetron (ZOFRAN) 4 MG tablet Take 1 tablet (4 mg total) by mouth every 8 (eight) hours as needed. 04/13/18   Devoria AlbeKnapp, Adalyna Godbee, MD  prenatal vitamin w/FE, FA  (PRENATAL 1 + 1) 27-1 MG TABS tablet Take 1 tablet by mouth daily at 12 noon. Patient not taking: Reported on 04/19/2017 03/20/17   Cheral MarkerBooker, Kimberly R, CNM    Family History Family History  Problem Relation Age of Onset  . Diabetes Mother   . Fibromyalgia Mother   . Hypertension Mother   . Congestive Heart Failure Father   . Narcolepsy Father   . Aneurysm Father   . Congestive Heart Failure Paternal Uncle   . Diabetes Maternal Grandmother   . Congestive Heart Failure Maternal Grandmother   . Diabetes Maternal Grandfather   . Congestive Heart Failure Maternal Grandfather   . Diabetes Paternal Grandmother   . Diabetes Paternal Grandfather   . Congestive Heart Failure Paternal Grandfather     Social History Social History   Tobacco Use  . Smoking status: Current Every Day Smoker    Packs/day: 0.25    Years: 3.00    Pack years: 0.75    Types: Cigarettes    Last attempt to quit: 11/22/2014    Years since quitting: 3.5  .  Smokeless tobacco: Never Used  Substance Use Topics  . Alcohol use: Yes  . Drug use: No    Types: Marijuana    Comment: last used 08/07/16  employed States 1 pack last 1-1/2 weeks.  Allergies   Latex   Review of Systems Review of Systems  All other systems reviewed and are negative.    Physical Exam Updated Vital Signs BP (!) 122/93 (BP Location: Right Arm)   Pulse 74   Temp 98.8 F (37.1 C) (Oral)   Resp 20   Ht 5\' 2"  (1.575 m)   Wt 81.6 kg   LMP 05/28/2018   SpO2 100%   BMI 32.92 kg/m   Vital signs normal    Physical Exam  Constitutional: She is oriented to person, place, and time. She appears well-developed and well-nourished.  Non-toxic appearance. She does not appear ill. No distress.  HENT:  Head: Normocephalic and atraumatic.  Right Ear: External ear normal.  Left Ear: External ear normal.  Nose: Nose normal. No mucosal edema or rhinorrhea.  Mouth/Throat: Oropharynx is clear and moist and mucous membranes are normal. No  dental abscesses or uvula swelling.  Patient is blowing her nose with clear drainage.  Eyes: Pupils are equal, round, and reactive to light. Conjunctivae and EOM are normal.  Neck: Normal range of motion and full passive range of motion without pain. Neck supple.  Cardiovascular: Normal rate, regular rhythm and normal heart sounds. Exam reveals no gallop and no friction rub.  No murmur heard. Pulmonary/Chest: Effort normal and breath sounds normal. No respiratory distress. She has no wheezes. She has no rhonchi. She has no rales. She exhibits no tenderness and no crepitus.  Abdominal: Soft. Normal appearance and bowel sounds are normal. She exhibits no distension. There is no tenderness. There is no rebound and no guarding.  Musculoskeletal: Normal range of motion. She exhibits no edema or tenderness.  Moves all extremities well.   Neurological: She is alert and oriented to person, place, and time. She has normal strength. No cranial nerve deficit.  Skin: Skin is warm, dry and intact. No rash noted. No erythema. No pallor.  Psychiatric: She has a normal mood and affect. Her speech is normal and behavior is normal. Her mood appears not anxious.  Nursing note and vitals reviewed.    ED Treatments / Results  Labs (all labs ordered are listed, but only abnormal results are displayed) Labs Reviewed - No data to display  EKG None  Radiology No results found.  Procedures Procedures (including critical care time)  Medications Ordered in ED Medications - No data to display   Initial Impression / Assessment and Plan / ED Course  I have reviewed the triage vital signs and the nursing notes.  Pertinent labs & imaging results that were available during my care of the patient were reviewed by me and considered in my medical decision making (see chart for details).     Patient appears to have a upper respiratory tract infection most likely viral.  She was given symptomatic treatment that  she can do at home.  Final Clinical Impressions(s) / ED Diagnoses   Final diagnoses:  Upper respiratory tract infection, unspecified type    ED Discharge Orders    None    OTC medications mucinex, zyrtec/claritin, ibuprofen/acetaminophen  Plan discharge  Devoria Albe, MD, Concha Pyo, MD 06/11/18 [phone]

## 2018-08-10 ENCOUNTER — Encounter (HOSPITAL_COMMUNITY): Payer: Self-pay | Admitting: Emergency Medicine

## 2018-08-10 ENCOUNTER — Emergency Department (HOSPITAL_COMMUNITY)
Admission: EM | Admit: 2018-08-10 | Discharge: 2018-08-10 | Disposition: A | Discharge status: Home / Self Care | Payer: Self-pay | Attending: Emergency Medicine | Admitting: Emergency Medicine

## 2018-08-10 ENCOUNTER — Other Ambulatory Visit: Payer: Self-pay

## 2018-08-10 ENCOUNTER — Emergency Department (HOSPITAL_COMMUNITY): Payer: Self-pay

## 2018-08-10 DIAGNOSIS — S6992XA Unspecified injury of left wrist, hand and finger(s), initial encounter: Secondary | ICD-10-CM

## 2018-08-10 DIAGNOSIS — Z79899 Other long term (current) drug therapy: Secondary | ICD-10-CM | POA: Insufficient documentation

## 2018-08-10 DIAGNOSIS — M79642 Pain in left hand: Secondary | ICD-10-CM | POA: Insufficient documentation

## 2018-08-10 DIAGNOSIS — F17210 Nicotine dependence, cigarettes, uncomplicated: Secondary | ICD-10-CM | POA: Insufficient documentation

## 2018-08-10 DIAGNOSIS — R202 Paresthesia of skin: Secondary | ICD-10-CM | POA: Insufficient documentation

## 2018-08-10 NOTE — ED Provider Notes (Signed)
[facility] EMERGENCY DEPARTMENT Provider Note   CSN: [id number] Arrival date & time: 08/10/18  0805     History   Chief Complaint Chief Complaint  Patient presents with  . Hand Injury    HPI Alphonsa GinDana N Deeney is a 27 y.o. female.  Alphonsa GinDana N Montufar is a 27 y.o. Female with a history of cluster headaches, who presents to the emergency department for evaluation of left hand injury.  She reports that 7 days ago while trying to clean old wax out of the candle she cut herself with a knife at the radial aspect of the second MCP joint when the knife slipped.  She reports that the area seemed to be healing well and has scabbed over but she is noticed some paresthesias in the second finger and some pain and swelling around the cut and knuckle.  She has not had any redness or drainage.  Reports very minimal pain.  She has some tingling just above the wound.  Is able to move the finger without difficulty.  Has not had any fevers or chills.     Past Medical History:  Diagnosis Date  . Headaches, cluster   . 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 Premenstrual dysphoric disorder   . UTI (lower urinary tract infection)     Patient Active Problem List   Diagnosis Date Noted  . Nexplanon insertion 04/19/2017  . History of prior pregnancy with IUGR newborn 03/20/2017  . History of gestational hypertension 02/09/2017  . Depression 11/14/2016  . 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 NO PAST SURGERIES       OB History    Gravida  1   Para  1   Term  1   Preterm      AB      Living  1     SAB      TAB      Ectopic      Multiple  0   Live Births  1            Home Medications    Prior to Admission medications   Medication Sig Start Date End Date Taking? Authorizing Provider  ibuprofen (ADVIL,MOTRIN) 600 MG tablet Take 1 tablet (600 mg total) by mouth every 6 (six) hours. 02/12/17   Willodean RosenthalHarraway-Smith, Carolyn, MD  ondansetron (ZOFRAN) 4 MG tablet  Take 1 tablet (4 mg total) by mouth every 8 (eight) hours as needed. 04/13/18   Devoria AlbeKnapp, Iva, MD  prenatal vitamin w/FE, FA (PRENATAL 1 + 1) 27-1 MG TABS tablet Take 1 tablet by mouth daily at 12 noon. Patient not taking: Reported on 04/19/2017 03/20/17   Cheral MarkerBooker, Kimberly R, CNM    Family History Family History  Problem Relation Age of Onset  . Diabetes Mother   . Fibromyalgia Mother   . Hypertension Mother   . Congestive Heart Failure Father   . Narcolepsy Father   . Aneurysm Father   . Congestive Heart Failure Paternal Uncle   . Diabetes Maternal Grandmother   . Congestive Heart Failure Maternal Grandmother   . Diabetes Maternal Grandfather   . Congestive Heart Failure Maternal Grandfather   . Diabetes Paternal Grandmother   . Diabetes Paternal Grandfather   . Congestive Heart Failure Paternal Grandfather     Social History Social History   Tobacco Use  . Smoking status: Current Every Day Smoker    Packs/day: 0.25    Years: 3.00  Pack years: 0.75    Types: Cigarettes  . Smokeless tobacco: Never Used  Substance Use Topics  . Alcohol use: Yes  . Drug use: No    Types: Marijuana    Comment: last used 08/07/16     Allergies   Latex   Review of Systems Review of Systems  Constitutional: Negative for chills and fever.  Musculoskeletal: Positive for joint swelling. Negative for arthralgias.  Skin: Positive for wound.  Neurological: Negative for weakness and numbness.       Paresthesia     Physical Exam Updated Vital Signs BP (!) 125/100 (BP Location: Right Arm)   Pulse 88   Temp 97.8 F (36.6 C) (Oral)   Resp 18   Ht 5' 2.5" (1.588 m)   Wt 81.6 kg   LMP 07/27/2018   SpO2 98%   BMI 32.40 kg/m   Physical Exam  Constitutional: She appears well-developed and well-nourished. No distress.  HENT:  Head: Normocephalic and atraumatic.  Eyes: Right eye exhibits no discharge. Left eye exhibits no discharge.  Pulmonary/Chest: Effort normal. No respiratory  distress.  Musculoskeletal:  Hand with 2 cm wound present over the radial aspect of the second MCP joint, appears scabbed over and to be well-healed, there is no surrounding erythema or warmth, no fluctuance or induration, there is a small amount of surrounding soft tissue swelling.  There is slight decreased sensation just distal to the wound on the second finger but sensation is intact at the fingertip and over the ulnar aspect of the finger.  Patient has full flexion and extension at each of the joints.  There is no erythema over the finger.  2+ radial pulse and good capillary refill.  No palpable bony deformities.  Neurological: She is alert. Coordination normal.  Skin: Skin is warm and dry. Capillary refill takes less than 2 seconds. She is not diaphoretic.  Psychiatric: She has a normal mood and affect. Her behavior is normal.  Nursing note and vitals reviewed.    ED Treatments / Results  Labs (all labs ordered are listed, but only abnormal results are displayed) Labs Reviewed - No data to display  EKG None  Radiology Dg Hand Complete Left  Result Date: 08/10/2018 CLINICAL DATA:  Pain and swelling. Knife wound to lateral aspect of hand EXAM: LEFT HAND - COMPLETE 3+ VIEW COMPARISON:  None. FINDINGS: Frontal, oblique, and lateral views obtained. No fracture or dislocation. Joint spaces appear normal. No erosive change. No soft tissue air or radiopaque foreign body. IMPRESSION: No fracture or dislocation. No evident arthropathy. No soft tissue air or radiopaque foreign body. Electronically Signed   By: Bretta Bang III M.D.   On: 08/10/2018 09:02    Procedures Procedures (including critical care time)  Medications Ordered in ED Medications - No data to display   Initial Impression / Assessment and Plan / ED Course  I have reviewed the triage vital signs and the nursing notes.  Pertinent labs & imaging results that were available during my care of the patient were reviewed  by me and considered in my medical decision making (see chart for details).  Presents for evaluation of laceration to the left hand that occurred 1 week ago, 2 cm wound over the radial aspect of the second MCP joint appears to be healing very well with no signs of surrounding infection.  Patient is having some paresthesias just above the wound but sensation is grossly intact, and patient has full range of motion.  X-ray shows no  evidence of bony abnormality, foreign body or soft tissue air.  Suggest small amount of soft tissue inflammation and swelling which may be causing some nerve compression, will have patient treat with anti-inflammatories, ice and elevation and follow-up with her primary care doctor if symptoms persist.  Discussed signs of infection other precautions that should warrant sooner return.  Patient expresses understanding and is in agreement with plan.  Final Clinical Impressions(s) / ED Diagnoses   Final diagnoses:  Injury of left hand, initial encounter  Paresthesia    ED Discharge Orders    None       Dartha Lodge, [place] 08/10/18 1610    Samuel Jester, DO 08/13/18 2214

## 2018-08-10 NOTE — Discharge Instructions (Signed)
The wound appears to be healing very well, your x-ray shows no evidence of fracture or other abnormalities.  The small amount of swelling and tingling sensation may be due to inflammation in the area pressing on the nerve, please take ibuprofen 600 mg 4 times daily, ice and elevate the area and if your symptoms are continuing please follow-up with your regular doctor.  If you develop signs of infection such as redness, swelling, warmth or drainage to the area return for reevaluation.

## 2018-08-10 NOTE — ED Triage Notes (Signed)
PT states she cut her left hand x7 seven days ago and since then has decreased sensation to left hand index finger. No s/s of infection noted and no drainage from scabbed over area noted. Full ROM present.

## 2018-09-24 ENCOUNTER — Other Ambulatory Visit: Payer: Self-pay

## 2018-09-24 ENCOUNTER — Emergency Department (HOSPITAL_COMMUNITY)
Admission: EM | Admit: 2018-09-24 | Discharge: 2018-09-24 | Disposition: A | Discharge status: Home / Self Care | Payer: Medicaid Other | Attending: Emergency Medicine | Admitting: Emergency Medicine

## 2018-09-24 ENCOUNTER — Encounter (HOSPITAL_COMMUNITY): Payer: Self-pay | Admitting: Emergency Medicine

## 2018-09-24 DIAGNOSIS — J111 Influenza due to unidentified influenza virus with other respiratory manifestations: Secondary | ICD-10-CM | POA: Insufficient documentation

## 2018-09-24 DIAGNOSIS — R69 Illness, unspecified: Secondary | ICD-10-CM

## 2018-09-24 DIAGNOSIS — Z91040 Latex allergy status: Secondary | ICD-10-CM | POA: Insufficient documentation

## 2018-09-24 DIAGNOSIS — F17210 Nicotine dependence, cigarettes, uncomplicated: Secondary | ICD-10-CM | POA: Insufficient documentation

## 2018-09-24 LAB — GROUP A STREP BY PCR: GROUP A STREP BY PCR: NOT DETECTED

## 2018-09-24 MED ORDER — BENZONATATE 100 MG PO CAPS: 100.0000 mg | ORAL_CAPSULE | Freq: Three times a day (TID) | ORAL | 0 refills | Status: DC

## 2018-09-24 MED ORDER — ONDANSETRON 8 MG PO TBDP: 8.0000 mg | ORAL_TABLET | Freq: Three times a day (TID) | ORAL | 0 refills | Status: DC | PRN

## 2018-09-24 NOTE — ED Notes (Signed)
Pt updated on plan of care,  

## 2018-09-24 NOTE — ED Provider Notes (Signed)
[facility] Provider Note   CSN: [id number] Arrival date & time: 09/24/18  0706     History   Chief Complaint Chief Complaint  Patient presents with  . Fever    HPI Bethany Gomez is a 27 y.o. female.  HPI Patient presented to the emergency room for evaluation of fever and body aches.  Patient states symptoms started yesterday.  She started having generalized malaise.  She developed a headache and left ear pain.  She has had a mild sore throat.  She started developing chills and then this morning she measured a temperature up to 103.  Patient's had a mild cough.  No shortness of breath.  No abdominal pain.  No dysuria.  She took some ibuprofen with improvement prior to arrival. Past Medical History:  Diagnosis Date  . Headaches, cluster   . 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 Premenstrual dysphoric disorder   . UTI (lower urinary tract infection)     Patient Active Problem List   Diagnosis Date Noted  . Nexplanon insertion 04/19/2017  . History of prior pregnancy with IUGR newborn 03/20/2017  . History of gestational hypertension 02/09/2017  . Depression 11/14/2016  . 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 NO PAST SURGERIES       OB History    Gravida  1   Para  1   Term  1   Preterm      AB      Living  1     SAB      TAB      Ectopic      Multiple  0   Live Births  1            Home Medications    Prior to Admission medications   Medication Sig Start Date End Date Taking? Authorizing Provider  benzonatate (TESSALON) 100 MG capsule Take 1 capsule (100 mg total) by mouth every 8 (eight) hours. 09/24/18   Linwood DibblesKnapp, Kynzleigh Bandel, MD  ibuprofen (ADVIL,MOTRIN) 600 MG tablet Take 1 tablet (600 mg total) by mouth every 6 (six) hours. 02/12/17   Willodean RosenthalHarraway-Smith, Carolyn, MD  ondansetron (ZOFRAN ODT) 8 MG disintegrating tablet Take 1 tablet (8 mg total) by mouth every 8 (eight) hours as needed for nausea or vomiting.  09/24/18   Linwood DibblesKnapp, Andersson Larrabee, MD  prenatal vitamin w/FE, FA (PRENATAL 1 + 1) 27-1 MG TABS tablet Take 1 tablet by mouth daily at 12 noon. Patient not taking: Reported on 04/19/2017 03/20/17   Cheral MarkerBooker, Kimberly R, CNM    Family History Family History  Problem Relation Age of Onset  . Diabetes Mother   . Fibromyalgia Mother   . Hypertension Mother   . Congestive Heart Failure Father   . Narcolepsy Father   . Aneurysm Father   . Congestive Heart Failure Paternal Uncle   . Diabetes Maternal Grandmother   . Congestive Heart Failure Maternal Grandmother   . Diabetes Maternal Grandfather   . Congestive Heart Failure Maternal Grandfather   . Diabetes Paternal Grandmother   . Diabetes Paternal Grandfather   . Congestive Heart Failure Paternal Grandfather     Social History Social History   Tobacco Use  . Smoking status: Current Every Day Smoker    Packs/day: 0.25    Years: 3.00    Pack years: 0.75    Types: Cigarettes  . Smokeless tobacco: Never Used  Substance Use Topics  . Alcohol use: Yes  Comment: occassionally  . Drug use: No    Types: Marijuana    Comment: last used 08/07/16     Allergies   Latex   Review of Systems Review of Systems  All other systems reviewed and are negative.    Physical Exam Updated Vital Signs BP 128/89   Pulse 86   Temp 98.3 F (36.8 C) (Oral)   Resp 20   Ht 1.588 m (5' 2.5")   Wt 83.9 kg   LMP 09/03/2018   SpO2 97%   BMI 33.30 kg/m   Physical Exam Vitals signs and nursing note reviewed.  Constitutional:      General: She is not in acute distress.    Appearance: She is well-developed.  HENT:     Head: Normocephalic and atraumatic.     Right Ear: Tympanic membrane and external ear normal.     Left Ear: Tympanic membrane and external ear normal.     Ears:     Comments: Increased cerumen in the left ear but no impaction, normal TM    Mouth/Throat:     Pharynx: No oropharyngeal exudate or posterior oropharyngeal erythema.  Eyes:       General: No scleral icterus.       Right eye: No discharge.        Left eye: No discharge.     Conjunctiva/sclera: Conjunctivae normal.  Neck:     Musculoskeletal: Neck supple.     Trachea: No tracheal deviation.  Cardiovascular:     Rate and Rhythm: Normal rate and regular rhythm.  Pulmonary:     Effort: Pulmonary effort is normal. No respiratory distress.     Breath sounds: Normal breath sounds. No stridor. No wheezing or rales.  Abdominal:     General: Bowel sounds are normal. There is no distension.     Palpations: Abdomen is soft.     Tenderness: There is no abdominal tenderness. There is no guarding or rebound.  Musculoskeletal:        General: No tenderness.  Skin:    General: Skin is warm and dry.     Findings: No rash.  Neurological:     Mental Status: She is alert.     Cranial Nerves: No cranial nerve deficit (no facial droop, extraocular movements intact, no slurred speech).     Sensory: No sensory deficit.     Motor: No abnormal muscle tone or seizure activity.     Coordination: Coordination normal.      ED Treatments / Results  Labs (all labs ordered are listed, but only abnormal results are displayed) Labs Reviewed  GROUP A STREP BY PCR    Procedures Procedures (including critical care time)  Medications Ordered in ED Medications - No data to display   Initial Impression / Assessment and Plan / ED Course  I have reviewed the triage vital signs and the nursing notes.  Pertinent labs & imaging results that were available during my care of the patient were reviewed by me and considered in my medical decision making (see chart for details).   Patient presented to the emergency room for evaluation of fever cough congestion and earache.  No signs of otitis media on exam.  Test was negative.  Her symptoms are suggestive of an influenza-like illness.  She is otherwise nontoxic and well-appearing.  Plan on discharge home with symptomatic treatment  Final  Clinical Impressions(s) / ED Diagnoses   Final diagnoses:  Influenza-like illness    ED Discharge Orders  Ordered    ondansetron (ZOFRAN ODT) 8 MG disintegrating tablet  Every 8 hours PRN,   Status:  Discontinued     09/24/18 0938    benzonatate (TESSALON) 100 MG capsule  Every 8 hours     09/24/18 0938    ondansetron (ZOFRAN ODT) 8 MG disintegrating tablet  Every 8 hours PRN     09/24/18 0939           Linwood DibblesKnapp, Frederica Chrestman, MD 09/24/18 [phone]

## 2018-09-24 NOTE — Discharge Instructions (Addendum)
Take the medications as needed for nausea and cough.  Take Tylenol or ibuprofen for fever.  Your symptoms should improve over the next week.  Follow-up with your doctor if not improving.  Return to the ED as needed if you start feeling much worse or short of breath.

## 2018-09-24 NOTE — ED Notes (Signed)
ED Provider at bedside. 

## 2018-09-24 NOTE — ED Triage Notes (Signed)
PT c/o body aches, headaches, general malaise, left ear pain and chills x1 day. PT states her fever was 103.0 this morning at home and took ibuprofen prior to ED arrival.

## 2019-10-30 ENCOUNTER — Telehealth: Payer: Self-pay | Admitting: *Deleted

## 2019-10-30 NOTE — Telephone Encounter (Signed)
Pt has the Nexplanon. Pt is having bleeding after sex. This is a new issue. Pt is having sex every other week and has not been this sexually active recently.  Pt does have a new partner. Pt was advised to schedule an appt. Pt voiced understanding and call was transferred to front desk for appt. JSY

## 2019-10-30 NOTE — Telephone Encounter (Signed)
Patient left message with question regarding birth control.

## 2019-11-05 ENCOUNTER — Ambulatory Visit: Payer: Medicaid Other | Admitting: Adult Health

## 2020-01-28 ENCOUNTER — Emergency Department (HOSPITAL_COMMUNITY)
Admission: EM | Admit: 2020-01-28 | Discharge: 2020-01-28 | Disposition: A | Discharge status: Home / Self Care | Payer: Medicaid Other | Attending: Emergency Medicine | Admitting: Emergency Medicine

## 2020-01-28 ENCOUNTER — Emergency Department (HOSPITAL_COMMUNITY): Payer: Medicaid Other

## 2020-01-28 ENCOUNTER — Other Ambulatory Visit: Payer: Self-pay

## 2020-01-28 ENCOUNTER — Encounter (HOSPITAL_COMMUNITY): Payer: Self-pay | Admitting: *Deleted

## 2020-01-28 DIAGNOSIS — Z975 Presence of (intrauterine) contraceptive device: Secondary | ICD-10-CM | POA: Insufficient documentation

## 2020-01-28 DIAGNOSIS — R42 Dizziness and giddiness: Secondary | ICD-10-CM | POA: Diagnosis present

## 2020-01-28 DIAGNOSIS — Z91040 Latex allergy status: Secondary | ICD-10-CM | POA: Diagnosis not present

## 2020-01-28 DIAGNOSIS — N926 Irregular menstruation, unspecified: Secondary | ICD-10-CM | POA: Insufficient documentation

## 2020-01-28 DIAGNOSIS — F17210 Nicotine dependence, cigarettes, uncomplicated: Secondary | ICD-10-CM | POA: Insufficient documentation

## 2020-01-28 LAB — COMPREHENSIVE METABOLIC PANEL
ALT: 22 U/L (ref 0–44)
AST: 21 U/L (ref 15–41)
Albumin: 4.3 g/dL (ref 3.5–5.0)
Alkaline Phosphatase: 47 U/L (ref 38–126)
Anion gap: 8 (ref 5–15)
BUN: 13 mg/dL (ref 6–20)
CO2: 21 mmol/L — ABNORMAL LOW (ref 22–32)
Calcium: 8.9 mg/dL (ref 8.9–10.3)
Chloride: 109 mmol/L (ref 98–111)
Creatinine, Ser: 0.7 mg/dL (ref 0.44–1.00)
GFR calc Af Amer: >60 mL/min (ref >60)
GFR calc non Af Amer: >60 mL/min (ref >60)
Glucose, Bld: 76 mg/dL (ref 70–99)
Potassium: 3.4 mmol/L — ABNORMAL LOW (ref 3.5–5.1)
Sodium: 138 mmol/L (ref 135–145)
Total Bilirubin: 0.7 mg/dL (ref 0.3–1.2)
Total Protein: 7.9 g/dL (ref 6.5–8.1)

## 2020-01-28 LAB — RAPID HIV SCREEN (HIV 1/2 AB+AG)
HIV 1/2 Antibodies: NONREACTIVE
HIV-1 P24 Antigen - HIV24: NONREACTIVE

## 2020-01-28 LAB — URINALYSIS, ROUTINE W REFLEX MICROSCOPIC
Bilirubin Urine: NEGATIVE
Glucose, UA: NEGATIVE mg/dL
Ketones, ur: 20 mg/dL — AB
Nitrite: NEGATIVE
Protein, ur: 30 mg/dL — AB
RBC / HPF: >50 RBC/hpf — ABNORMAL HIGH (ref 0–5)
Specific Gravity, Urine: 1.026 (ref 1.005–1.030)
pH: 6 (ref 5.0–8.0)

## 2020-01-28 LAB — HIV ANTIBODY (ROUTINE TESTING W REFLEX): HIV Screen 4th Generation wRfx: NONREACTIVE

## 2020-01-28 LAB — CBC WITH DIFFERENTIAL/PLATELET
Abs Immature Granulocytes: 0.02 10*3/uL (ref 0.00–0.07)
Basophils Absolute: 0.1 10*3/uL (ref 0.0–0.1)
Basophils Relative: 1 %
Eosinophils Absolute: 0.1 10*3/uL (ref 0.0–0.5)
Eosinophils Relative: 2 %
HCT: 42 % (ref 36.0–46.0)
Hemoglobin: 13 g/dL (ref 12.0–15.0)
Immature Granulocytes: 0 %
Lymphocytes Relative: 42 %
Lymphs Abs: 3.1 10*3/uL (ref 0.7–4.0)
MCH: 27.2 pg (ref 26.0–34.0)
MCHC: 31 g/dL (ref 30.0–36.0)
MCV: 87.9 fL (ref 80.0–100.0)
Monocytes Absolute: 0.5 10*3/uL (ref 0.1–1.0)
Monocytes Relative: 7 %
Neutro Abs: 3.7 10*3/uL (ref 1.7–7.7)
Neutrophils Relative %: 48 %
Platelets: 328 10*3/uL (ref 150–400)
RBC: 4.78 MIL/uL (ref 3.87–5.11)
RDW: 13.2 % (ref 11.5–15.5)
WBC: 7.5 10*3/uL (ref 4.0–10.5)
nRBC: 0 % (ref 0.0–0.2)

## 2020-01-28 LAB — WET PREP, GENITAL
Clue Cells Wet Prep HPF POC: NONE SEEN
Sperm: NONE SEEN
Trich, Wet Prep: NONE SEEN
WBC, Wet Prep HPF POC: NONE SEEN
Yeast Wet Prep HPF POC: NONE SEEN

## 2020-01-28 LAB — LIPASE, BLOOD: Lipase: 53 U/L — ABNORMAL HIGH (ref 11–51)

## 2020-01-28 LAB — I-STAT BETA HCG BLOOD, ED (MC, WL, AP ONLY): I-stat hCG, quantitative: <5 m[IU]/mL (ref <5)

## 2020-01-28 MED ORDER — MECLIZINE HCL 25 MG PO TABS: 25.0000 mg | ORAL_TABLET | Freq: Three times a day (TID) | ORAL | 0 refills | Status: DC | PRN

## 2020-01-28 MED ORDER — SODIUM CHLORIDE 0.9 % IV BOLUS
1000.0000 mL | Freq: Once | INTRAVENOUS | Status: AC
  Administered 2020-01-28: 1000 mL via INTRAVENOUS

## 2020-01-28 MED ORDER — MECLIZINE HCL 12.5 MG PO TABS
25.0000 mg | ORAL_TABLET | Freq: Once | ORAL | Status: AC
  Administered 2020-01-28: 25 mg via ORAL
  Filled 2020-01-28: qty 2

## 2020-01-28 NOTE — ED Triage Notes (Signed)
Pt c/o headache, dizziness, stumbling and then vomiting that started today while at work. Pt reports she has been having problems with her BP for the last 2 months but she isn't currently on medication, just self logging her BP at home.

## 2020-01-28 NOTE — ED Provider Notes (Signed)
[facility] EMERGENCY DEPARTMENT Provider Note   CSN: [id number] Arrival date & time: 01/28/20  1407     History No chief complaint on file.   Bethany Gomez is a 29 y.o. female who presents to the ED today with complaint of gradual onset, constant, achy, diffuse headache x 3 days. Pt reports she typically gets headaches due to grinding her teeth during times of stress; she does report she has been stressed as of late. Pt mentions however that today while at work she began having a room spinning sensation which caused her to vomit approximately 3 times. She then noticed some lower abdominal cramping. Pt reports that the dizziness has gone down in severity however is still present and worse with fast movements. She reports hx of vertigo however reports this feels different. Pt does mention her LNMP was 2 weeks ago but she began spotting today; she recently had intercourse 2 weeks ago however the condom broke. Pt does have nexplanon in place and is set to have it changed out in June. She reports remote possibility that she could be pregnant. Pt denies fevers, chills, diarrhea, neck stiffness, confusion, unilateral weakness or numbness, rash, or any other associated symptoms.   The history is provided by the patient and medical records.       Past Medical History:  Diagnosis Date  . Headaches, cluster   . 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 Premenstrual dysphoric disorder   . UTI (lower urinary tract infection)     Patient Active Problem List   Diagnosis Date Noted  . Nexplanon insertion 04/19/2017  . History of prior pregnancy with IUGR newborn 03/20/2017  . History of gestational hypertension 02/09/2017  . Depression 11/14/2016  . 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 NO PAST SURGERIES       OB History    Gravida  1   Para  1   Term  1   Preterm      AB      Living  1     SAB      TAB      Ectopic      Multiple  0   Live Births  1             Family History  Problem Relation Age of Onset  . Diabetes Mother   . Fibromyalgia Mother   . Hypertension Mother   . Congestive Heart Failure Father   . Narcolepsy Father   . Aneurysm Father   . Congestive Heart Failure Paternal Uncle   . Diabetes Maternal Grandmother   . Congestive Heart Failure Maternal Grandmother   . Diabetes Maternal Grandfather   . Congestive Heart Failure Maternal Grandfather   . Diabetes Paternal Grandmother   . Diabetes Paternal Grandfather   . Congestive Heart Failure Paternal Grandfather     Social History   Tobacco Use  . Smoking status: Current Every Day Smoker    Packs/day: 0.50    Years: 3.00    Pack years: 1.50    Types: Cigarettes  . Smokeless tobacco: Never Used  Substance Use Topics  . Alcohol use: Yes    Comment: rarely   . Drug use: Yes    Types: Marijuana    Home Medications Prior to Admission medications   Medication Sig Start Date End Date Taking? Authorizing Provider  meclizine (ANTIVERT) 25 MG tablet Take 1 tablet (25 mg total) by mouth 3 (three) times daily  as needed for dizziness. 01/28/20   Tanda Rockers, PA-C    Allergies    Latex  Review of Systems   Review of Systems  Constitutional: Negative for chills and fever.  Eyes: Negative for visual disturbance.  Respiratory: Negative for shortness of breath.   Cardiovascular: Negative for chest pain.  Gastrointestinal: Positive for nausea and vomiting. Negative for constipation and diarrhea.  Genitourinary: Positive for pelvic pain and vaginal bleeding.  Neurological: Positive for dizziness and headaches. Negative for syncope, speech difficulty, weakness and numbness.  All other systems reviewed and are negative.   Physical Exam Updated Vital Signs BP (!) 153/92 (BP Location: Right Arm)   Pulse 92   Temp 98 F (36.7 C) (Oral)   Resp 16   Ht 5' 3.5" (1.613 m)   Wt 81.6 kg   LMP 01/21/2020   SpO2 99%   BMI 31.39 kg/m   Physical Exam Vitals and  nursing note reviewed.  Constitutional:      Appearance: She is not ill-appearing or diaphoretic.  HENT:     Head: Normocephalic and atraumatic.     Right Ear: Tympanic membrane normal.     Left Ear: Tympanic membrane normal.  Eyes:     Extraocular Movements: Extraocular movements intact.     Conjunctiva/sclera: Conjunctivae normal.     Pupils: Pupils are equal, round, and reactive to light.  Cardiovascular:     Rate and Rhythm: Normal rate and regular rhythm.     Pulses: Normal pulses.  Pulmonary:     Effort: Pulmonary effort is normal.     Breath sounds: Normal breath sounds. No wheezing, rhonchi or rales.  Abdominal:     Palpations: Abdomen is soft.     Tenderness: There is abdominal tenderness. There is no right CVA tenderness, left CVA tenderness, guarding or rebound.     Comments: + suprapubic abdominal TTP  Genitourinary:    Comments: Chaperone present for exam. No rashes, lesions, or tenderness to external genitalia. No erythema, injury, or tenderness to vaginal mucosa. Small amount of blood in vault. No adnexal masses, tenderness, or fullness. No CMT, cervical friability, or discharge from cervical os. Cervical os is closed. Uterus non-deviated, mobile, nonTTP, and without enlargement.   Musculoskeletal:     Cervical back: Neck supple. No rigidity.  Skin:    General: Skin is warm and dry.  Neurological:     Mental Status: She is alert.     Comments: CN 3-12 grossly intact A&O x4 GCS 15 Sensation and strength intact Gait nonataxic including with tandem walking Coordination with finger-to-nose WNL Neg romberg, neg pronator drift     ED Results / Procedures / Treatments   Labs (all labs ordered are listed, but only abnormal results are displayed) Labs Reviewed  URINALYSIS, ROUTINE W REFLEX MICROSCOPIC - Abnormal; Notable for the following components:      Result Value   APPearance HAZY (*)    Hgb urine dipstick LARGE (*)    Ketones, ur 20 (*)    Protein, ur 30  (*)    Leukocytes,Ua TRACE (*)    RBC / HPF >50 (*)    Bacteria, UA RARE (*)    All other components within normal limits  COMPREHENSIVE METABOLIC PANEL - Abnormal; Notable for the following components:   Potassium 3.4 (*)    CO2 21 (*)    All other components within normal limits  LIPASE, BLOOD - Abnormal; Notable for the following components:   Lipase 53 (*)  All other components within normal limits  WET PREP, GENITAL  CBC WITH DIFFERENTIAL/PLATELET  HIV ANTIBODY (ROUTINE TESTING W REFLEX)  RAPID HIV SCREEN (HIV 1/2 AB+AG)  I-STAT BETA HCG BLOOD, ED ([facility], [facility], [facility] ONLY)  GC/CHLAMYDIA PROBE AMP ([place]) NOT AT [facility]    EKG EKG Interpretation  Date/Time:  Tuesday Jan 28 2020 14:29:35 EDT Ventricular Rate:  83 PR Interval:  122 QRS Duration: 128 QT Interval:  400 QTC Calculation: 470 R Axis:   32 Text Interpretation: Normal sinus rhythm Right bundle branch block Nonspecific T wave abnormality Confirmed by Lajean Saver [phone]) on 01/28/2020 2:44:57 PM   Radiology CT Head Wo Contrast  Result Date: 01/28/2020 CLINICAL DATA:  Headache EXAM: CT HEAD WITHOUT CONTRAST TECHNIQUE: Contiguous axial images were obtained from the base of the skull through the vertex without intravenous contrast. COMPARISON:  None. FINDINGS: Brain: No evidence of acute infarction, hemorrhage, hydrocephalus, extra-axial collection or mass lesion/mass effect. Vascular: No hyperdense vessel or unexpected calcification. Skull: Normal. Negative for fracture or focal lesion. Sinuses/Orbits: No acute finding. Other: None IMPRESSION: Negative non contrasted CT appearance of the brain Electronically Signed   By: Donavan Foil M.D.   On: 01/28/2020 19:54    Procedures Procedures (including critical care time)  Medications Ordered in ED Medications  sodium chloride 0.9 % bolus 1,000 mL (0 mLs Intravenous Stopped 01/28/20 2144)  meclizine (ANTIVERT) tablet 25 mg (25 mg Oral Given 01/28/20 2033)    ED Course  I  have reviewed the triage vital signs and the nursing notes.  Pertinent labs & imaging results that were available during my care of the patient were reviewed by me and considered in my medical decision making (see chart for details).  Clinical Course as of Jan 27 2154  Tue Jan 28, 2020  1859 I-stat hCG, quantitative: <5.0 [MV]    Clinical Course User Index [MV] Eustaquio Maize, PA-C   MDM Rules/Calculators/A&P                      29 year old female who presents to the ED today with complaint of headache for the past 3 days with sudden onset of dizziness and vomiting.  Also reports some lower abdominal pain.  Is currently having vaginal spotting, last normal menstrual period 2 weeks ago.  She did have intercourse with noted condom breaking after her period is concerned she could be pregnant however does have Nexplanon in place.  Reports that the dizziness has subsided however worsened with movement.  It does sound more like peripheral vertigo however with concern for being pregnant will obtain pregnancy test at this time prior to any potential imaging.  She is overall well-appearing.  She is afebrile, nontachycardic nontachypneic on arrival to the ED.  No focal neuro deficits on exam today.  She does note a history of vertigo but is not currently taking any medications for it.   Pregnancy test negative at this time.  Pelvic exam performed; small amount of blood in vault however no adnexal or CMT TTP. Pt has mild suprapubic abd TTP but no peritoneal signs. Do not feel she needs imaging at this time given no tenderness.   Wet prep negative.  U/A with trace leuks, 6-10 wbcs however 11-20 squamous epithelial suggestive of contamination. Pt without urinary complaints  Given complaint of sudden dizziness and negative pregnancy test CT head ordered; no acute findings. Will provide fluids and meclizine and reevaluate.   Remainder of labs reassuring CBC without leukocytosis. Hgb  stable.  CMP with  potassium 3.4 and bicarb 21. No other findings.  Lipase 53.   On reevaluation pt reports improvement in symptoms with fluids and meclizine. Will discharge home with same. Pt ambulated in the ED without difficulty and no changes in gait. Stable for discharge home at this time. Advised to follow up with PCP. Advised to hold off on intercourse until she hears results of GC Chlamydia test. Pt is in agreement with plan at this time.   This note was prepared using Dragon voice recognition software and may include unintentional dictation errors due to the inherent limitations of voice recognition software.  Final Clinical Impression(s) / ED Diagnoses Final diagnoses:  Dizziness  Breakthrough bleeding on Nexplanon  Vertigo    Rx / DC Orders ED Discharge Orders         Ordered    meclizine (ANTIVERT) 25 MG tablet  3 times daily PRN     01/28/20 2153           Discharge Instructions     Your labwork and images was reassuring today. Your symptoms sound consistent with vertigo. Please pick up medication and take as needed for dizziness. Drink plenty of fluids to stay hydrated. Follow up with your PCP regarding your ED visit today. If you do not have one you can follow up with [facility] for primary care needs   Please refrain from intercourse until you receive the remainder of your results. If positive we will call you. You can also log onto mychart and check the results that way. If you are positive you can return to the ED for treatment. You will need to let all partners know that they will need to be treated as well  Return to the ED IMMEDIATELY for any worsening symptoms including worsening pain, worsening dizziness, vision changes, excessive vomiting, passing out.        Tanda Rockers, PA-C 01/28/20 2155    Pollyann Savoy, MD 01/28/20 [phone]

## 2020-01-28 NOTE — ED Notes (Signed)
Pt ambulated without assistance and denies any dizziness at this time.

## 2020-01-28 NOTE — Discharge Instructions (Addendum)
Your labwork and images was reassuring today. Your symptoms sound consistent with vertigo. Please pick up medication and take as needed for dizziness. Drink plenty of fluids to stay hydrated. Follow up with your PCP regarding your ED visit today. If you do not have one you can follow up with [facility] and Wellness for primary care needs   Please refrain from intercourse until you receive the remainder of your results. If positive we will call you. You can also log onto mychart and check the results that way. If you are positive you can return to the ED for treatment. You will need to let all partners know that they will need to be treated as well  Return to the ED IMMEDIATELY for any worsening symptoms including worsening pain, worsening dizziness, vision changes, excessive vomiting, passing out.

## 2020-01-30 ENCOUNTER — Encounter (HOSPITAL_COMMUNITY): Payer: Self-pay | Admitting: Emergency Medicine

## 2020-01-30 ENCOUNTER — Emergency Department (HOSPITAL_COMMUNITY)
Admission: EM | Admit: 2020-01-30 | Discharge: 2020-01-30 | Disposition: A | Discharge status: Home / Self Care | Payer: Medicaid Other | Attending: Emergency Medicine | Admitting: Emergency Medicine

## 2020-01-30 ENCOUNTER — Other Ambulatory Visit: Payer: Self-pay

## 2020-01-30 DIAGNOSIS — H6121 Impacted cerumen, right ear: Secondary | ICD-10-CM | POA: Insufficient documentation

## 2020-01-30 DIAGNOSIS — R42 Dizziness and giddiness: Secondary | ICD-10-CM

## 2020-01-30 DIAGNOSIS — F17210 Nicotine dependence, cigarettes, uncomplicated: Secondary | ICD-10-CM | POA: Diagnosis not present

## 2020-01-30 LAB — GC/CHLAMYDIA PROBE AMP (~~LOC~~) NOT AT ARMC
Chlamydia: NEGATIVE
Comment: NEGATIVE
Comment: NORMAL
Neisseria Gonorrhea: NEGATIVE

## 2020-01-30 MED ORDER — MECLIZINE HCL 25 MG PO TABS: 25.0000 mg | ORAL_TABLET | Freq: Three times a day (TID) | ORAL | 0 refills | Status: DC | PRN

## 2020-01-30 NOTE — ED Triage Notes (Signed)
Pt seen here in ED on Tuesday for headache and dizziness.  C/o hypertension, BP at home 150/91,  BP in triage 157/102.  C/o left ear pain intermittent,discomfort.

## 2020-01-30 NOTE — ED Provider Notes (Signed)
[facility] Emergency Department Provider Note MRN:  [id number]  Arrival date & time: 01/30/20     Chief Complaint   Dizziness   History of Present Illness   Bethany Gomez is a 29 y.o. year-old female with a history of headaches presenting to the ED with chief complaint of dizziness.  Dizziness described as being on a merry-go-round for the past few days.  Intermittent occipital headache as well.  Denies chest pain or shortness of breath, no numbness or weakness, no abdominal pain, no other complaints.  Symptoms mild, here because the [facility] did not have her meclizine medication.  Review of Systems  A complete 10 system review of systems was obtained and all systems are negative except as noted in the HPI and PMH.   Patient's Health History    Past Medical History:  Diagnosis Date  . Headaches, cluster   . 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 Premenstrual dysphoric disorder   . UTI (lower urinary tract infection)     Past Surgical History:  Procedure Laterality Date  . NO PAST SURGERIES      Family History  Problem Relation Age of Onset  . Diabetes Mother   . Fibromyalgia Mother   . Hypertension Mother   . Congestive Heart Failure Father   . Narcolepsy Father   . Aneurysm Father   . Congestive Heart Failure Paternal Uncle   . Diabetes Maternal Grandmother   . Congestive Heart Failure Maternal Grandmother   . Diabetes Maternal Grandfather   . Congestive Heart Failure Maternal Grandfather   . Diabetes Paternal Grandmother   . Diabetes Paternal Grandfather   . Congestive Heart Failure Paternal Grandfather     Social History   Socioeconomic History  . Marital status: Single    Spouse name: Not on file  . Number of children: Not on file  . Years of education: Not on file  . Highest education level: Not on file  Occupational History  . Not on file  Tobacco Use  . Smoking status: Current Every Day Smoker    Packs/day: 0.50    Years: 3.00    Pack years:  1.50    Types: Cigarettes  . Smokeless tobacco: Never Used  Substance and Sexual Activity  . Alcohol use: Yes    Comment: rarely   . Drug use: Yes    Types: Marijuana  . Sexual activity: Not Currently  Other Topics Concern  . Not on file  Social History Narrative  . Not on file   Social Determinants of Health   Financial Resource Strain:   . Difficulty of Paying Living Expenses:   Food Insecurity:   . Worried About Charity fundraiser in the Last Year:   . Arboriculturist in the Last Year:   Transportation Needs:   . Film/video editor (Medical):   Marland Kitchen Lack of Transportation (Non-Medical):   Physical Activity:   . Days of Exercise per Week:   . Minutes of Exercise per Session:   Stress:   . Feeling of Stress :   Social Connections:   . Frequency of Communication with Friends and Family:   . Frequency of Social Gatherings with Friends and Family:   . Attends Religious Services:   . Active Member of Clubs or Organizations:   . Attends Archivist Meetings:   Marland Kitchen Marital Status:   Intimate Partner Violence:   . Fear of Current or Ex-Partner:   . Emotionally Abused:   .  Physically Abused:   . Sexually Abused:      Physical Exam   Vitals:   01/30/20 1002  BP: (!) 157/102  Pulse: 77  Resp: 15  Temp: 98.5 F (36.9 C)  SpO2: 100%    CONSTITUTIONAL: Well-appearing, NAD NEURO:  Alert and oriented x 3, normal and symmetric strength, normal coordination, normal speech EYES:  eyes equal and reactive ENT/NECK:  no LAD, no JVD, unable to visualize right TM due to cerumen impaction CARDIO: Regular rate, well-perfused, normal S1 and S2 PULM:  CTAB no wheezing or rhonchi GI/GU:  normal bowel sounds, non-distended, non-tender MSK/SPINE:  No gross deformities, no edema SKIN:  no rash, atraumatic PSYCH:  Appropriate speech and behavior  *Additional and/or pertinent findings included in MDM below  Diagnostic and Interventional Summary    EKG  Interpretation  Date/Time:    Ventricular Rate:    PR Interval:    QRS Duration:   QT Interval:    QTC Calculation:   R Axis:     Text Interpretation:        Labs Reviewed - No data to display  No orders to display    Medications - No data to display   Procedures  /  Critical Care Procedures  ED Course and Medical Decision Making  I have reviewed the triage vital signs, the nursing notes, and pertinent available records from the EMR.  Listed above are laboratory and imaging tests that I personally ordered, reviewed, and interpreted and then considered in my medical decision making (see below for details).      Describing vertigo, favoring peripheral cause, likely due to cerumen impaction.  Neurological exam very reassuring with no objective deficits.  She was endorsing some sensation disturbance intermittently to her left wrist and left ankle however she endorsed normal sensation more distal and more proximal to these areas.  This is highly unlikely to be related to the CNS and she had a recent work-up yesterday with a normal CT head.  Strict return precautions for worsening sensory abnormalities, appropriate for discharge.    Elmer Sow. Pilar Plate, MD [facility] Emergency Medicine [facility] [email or web address]  Final Clinical Impressions(s) / ED Diagnoses     ICD-10-CM   1. Impacted cerumen of right ear  H61.21   2. Vertigo  R42     ED Discharge Orders         Ordered    meclizine (ANTIVERT) 25 MG tablet  3 times daily PRN     01/30/20 1056           Discharge Instructions Discussed with and Provided to Patient:     Discharge Instructions     You were evaluated in the Emergency Department and after careful evaluation, we did not find any emergent condition requiring admission or further testing in the hospital.  Your exam/testing today was overall reassuring.  As discussed, your symptoms may be due to vertigo, caused by too much earwax in the  right ear.  We recommend mixing hydrogen peroxide with water in equal amounts and using this mixture to dissolve the earwax as we discussed.  Please return to the Emergency Department if you experience any worsening of your condition.  We encourage you to follow up with a primary care provider.  Thank you for allowing [place] to be a part of your care.       Sabas Sous, MD 01/30/20 1100

## 2020-01-30 NOTE — Discharge Instructions (Addendum)
You were evaluated in the Emergency Department and after careful evaluation, we did not find any emergent condition requiring admission or further testing in the hospital.  Your exam/testing today was overall reassuring.  As discussed, your symptoms may be due to vertigo, caused by too much earwax in the right ear.  We recommend mixing hydrogen peroxide with water in equal amounts and using this mixture to dissolve the earwax as we discussed.  Please return to the Emergency Department if you experience any worsening of your condition.  We encourage you to follow up with a primary care provider.  Thank you for allowing [place] to be a part of your care.

## 2020-04-29 ENCOUNTER — Other Ambulatory Visit: Payer: Self-pay

## 2020-04-29 ENCOUNTER — Encounter (HOSPITAL_COMMUNITY): Payer: Self-pay

## 2020-04-29 ENCOUNTER — Emergency Department (HOSPITAL_COMMUNITY)
Admission: EM | Admit: 2020-04-29 | Discharge: 2020-04-29 | Disposition: A | Discharge status: Home / Self Care | Payer: Medicaid Other | Attending: Emergency Medicine | Admitting: Emergency Medicine

## 2020-04-29 DIAGNOSIS — I10 Essential (primary) hypertension: Secondary | ICD-10-CM | POA: Diagnosis not present

## 2020-04-29 DIAGNOSIS — Z20822 Contact with and (suspected) exposure to covid-19: Secondary | ICD-10-CM

## 2020-04-29 DIAGNOSIS — Z91040 Latex allergy status: Secondary | ICD-10-CM | POA: Diagnosis not present

## 2020-04-29 LAB — SARS CORONAVIRUS 2 BY RT PCR (HOSPITAL ORDER, PERFORMED IN ~~LOC~~ HOSPITAL LAB): SARS Coronavirus 2: NEGATIVE

## 2020-04-29 NOTE — ED Provider Notes (Signed)
[facility] EMERGENCY DEPARTMENT Provider Note   CSN: [id number] Arrival date & time: 04/29/20  9798     History Chief Complaint  Patient presents with  . wants covid test    Bethany Gomez is a 29 y.o. female.  She has no significant past medical history.  She had a possible exposure to Covid.  Her son's father is positive for Covid and has been sick for a few weeks.  Her son is bent over to his house a few times.  She herself denies any Covid-like symptoms including no cough fever nausea vomiting diarrhea.  The history is provided by the patient.       Past Medical History:  Diagnosis Date  . Headaches, cluster   . 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 Premenstrual dysphoric disorder   . UTI (lower urinary tract infection)     Patient Active Problem List   Diagnosis Date Noted  . Nexplanon insertion 04/19/2017  . History of prior pregnancy with IUGR newborn 03/20/2017  . History of gestational hypertension 02/09/2017  . Depression 11/14/2016  . 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 NO PAST SURGERIES       OB History    Gravida  1   Para  1   Term  1   Preterm      AB      Living  1     SAB      TAB      Ectopic      Multiple  0   Live Births  1           Family History  Problem Relation Age of Onset  . Diabetes Mother   . Fibromyalgia Mother   . Hypertension Mother   . Congestive Heart Failure Father   . Narcolepsy Father   . Aneurysm Father   . Congestive Heart Failure Paternal Uncle   . Diabetes Maternal Grandmother   . Congestive Heart Failure Maternal Grandmother   . Diabetes Maternal Grandfather   . Congestive Heart Failure Maternal Grandfather   . Diabetes Paternal Grandmother   . Diabetes Paternal Grandfather   . Congestive Heart Failure Paternal Grandfather     Social History   Tobacco Use  . Smoking status: Current Every Day Smoker    Packs/day: 0.50    Years: 3.00    Pack years: 1.50     Types: Cigarettes  . Smokeless tobacco: Never Used  Vaping Use  . Vaping Use: Never used  Substance Use Topics  . Alcohol use: Yes    Comment: rarely   . Drug use: Yes    Types: Marijuana    Home Medications Prior to Admission medications   Medication Sig Start Date End Date Taking? Authorizing Provider  meclizine (ANTIVERT) 25 MG tablet Take 1 tablet (25 mg total) by mouth 3 (three) times daily as needed for dizziness. 01/30/20   Sabas Sous, MD    Allergies    Latex  Review of Systems   Review of Systems  Constitutional: Negative for fever.  HENT: Negative for sore throat.   Respiratory: Negative for shortness of breath.   Cardiovascular: Negative for chest pain.  Gastrointestinal: Negative for abdominal pain.  Genitourinary: Negative for dysuria.  Skin: Negative for rash.  Neurological: Negative for headaches.    Physical Exam Updated Vital Signs BP (!) 147/97 (BP Location: Left Arm)   Pulse 91   Temp 98.5 F (36.9 C) (  Oral)   Resp 20   Ht 5\' 3"  (1.6 m)   Wt 81.6 kg   SpO2 100%   Breastfeeding No   BMI 31.89 kg/m   Physical Exam Vitals and nursing note reviewed.  Constitutional:      Appearance: Normal appearance. She is well-developed.  HENT:     Head: Normocephalic and atraumatic.  Eyes:     Conjunctiva/sclera: Conjunctivae normal.  Cardiovascular:     Rate and Rhythm: Normal rate and regular rhythm.  Pulmonary:     Effort: Pulmonary effort is normal.     Breath sounds: Normal breath sounds.  Abdominal:     Palpations: Abdomen is soft.     Tenderness: There is no abdominal tenderness.  Musculoskeletal:        General: No deformity or signs of injury. Normal range of motion.     Cervical back: Neck supple.  Skin:    General: Skin is warm and dry.     Capillary Refill: Capillary refill takes less than 2 seconds.  Neurological:     General: No focal deficit present.     Mental Status: She is alert.     GCS: GCS eye subscore is 4. GCS verbal  subscore is 5. GCS motor subscore is 6.     ED Results / Procedures / Treatments   Labs (all labs ordered are listed, but only abnormal results are displayed) Labs Reviewed  SARS CORONAVIRUS 2 BY RT PCR (HOSPITAL ORDER, PERFORMED IN [facility] LAB)    EKG None  Radiology No results found.  Procedures Procedures (including critical care time)  Medications Ordered in ED Medications - No data to display  ED Course  I have reviewed the triage vital signs and the nursing notes.  Pertinent labs & imaging results that were available during my care of the patient were reviewed by me and considered in my medical decision making (see chart for details).    MDM Rules/Calculators/A&P                           Final Clinical Impression(s) / ED Diagnoses Final diagnoses:  Person under investigation for COVID-19  Hypertension, unspecified type    Rx / DC Orders ED Discharge Orders    None       [facility], MD 04/30/20 [phone]

## 2020-04-29 NOTE — ED Triage Notes (Signed)
Pt reports her son was exposed to his father and his father has covid.  Pt wanting to be tested.  Denies any symptoms.

## 2020-04-29 NOTE — Discharge Instructions (Addendum)
You were seen in the emergency department after possible exposure to Covid.  You were not having symptoms and your vital signs were unremarkable other than an elevated blood pressure.  Your Covid test was pending at time of discharge and you can check My Chart for results.  If you are positive you should isolate for up to 14 days.  Please follow-up with your primary care doctor regarding your elevated blood pressure.  Return to the emergency department if any worsening or concerning symptoms

## 2020-05-10 IMAGING — CT CT HEAD W/O CM
3 series · 16 of 46 positions shown, 19 images · non-contrast
Comparison: None.

CLINICAL DATA: Headache

EXAM:
CT HEAD WITHOUT CONTRAST
TECHNIQUE: Contiguous axial images were obtained from the base of the skull
through the vertex without intravenous contrast.

[Series 2: head w o · axial · 0.41mm/px · z∈[+14,+134]mm · 10 of 29 slices shown, 13 images]
[im 3/29  brain]
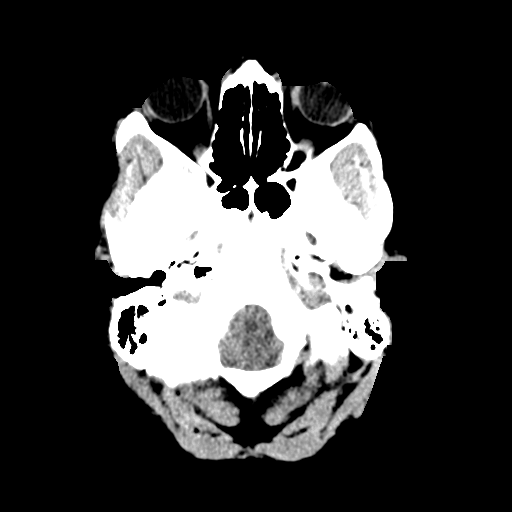
[im 3/29  bone]
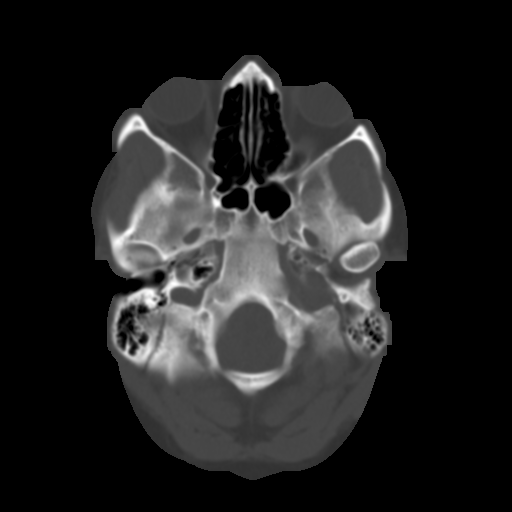
[im 6/29  brain]
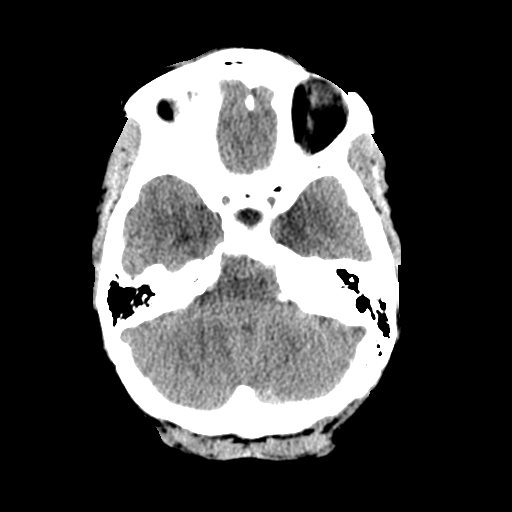
[im 8/29  brain]
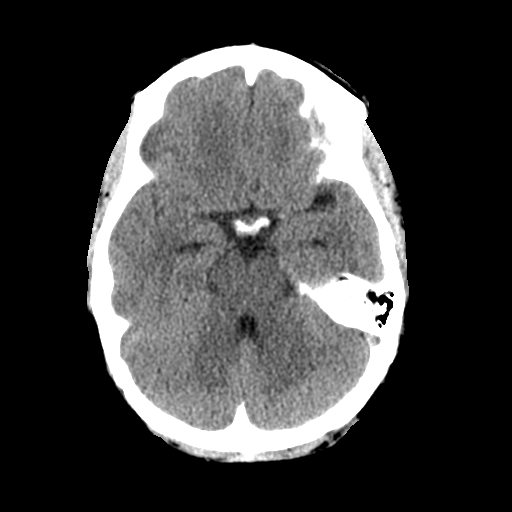
[im 11/29  brain]
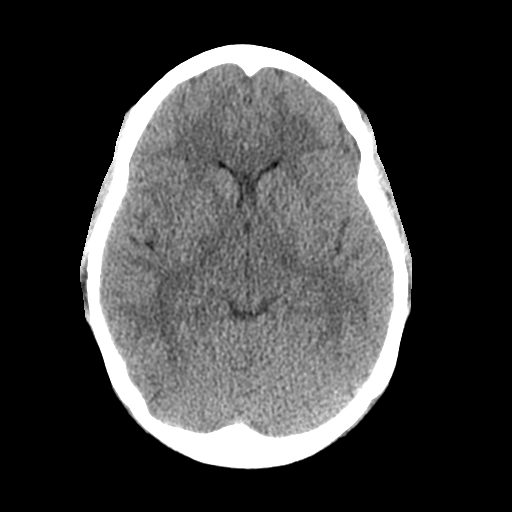
[im 14/29  brain]
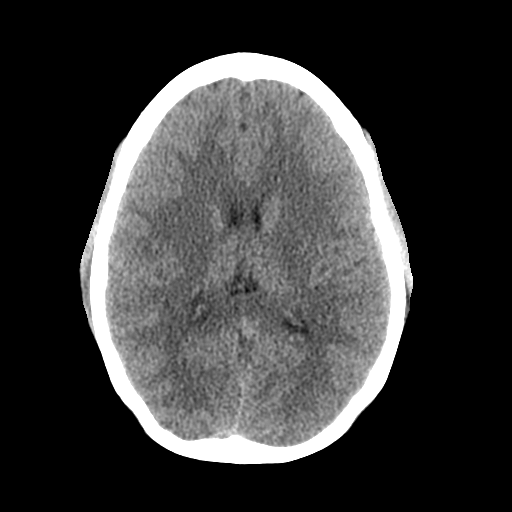
[im 14/29  bone]
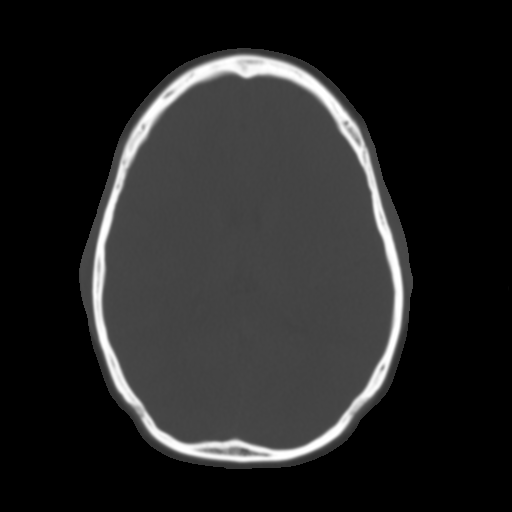
[im 16/29  brain]
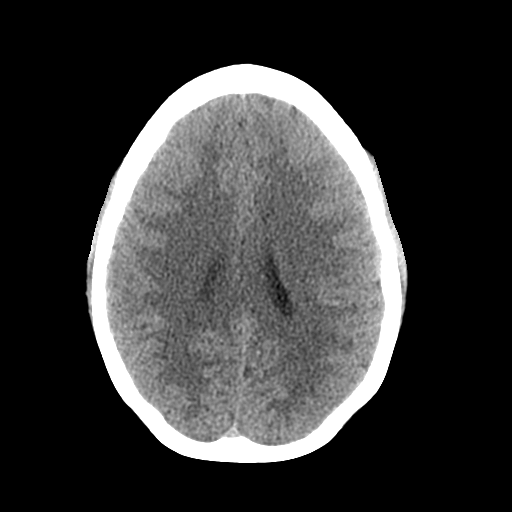
[im 19/29  brain]
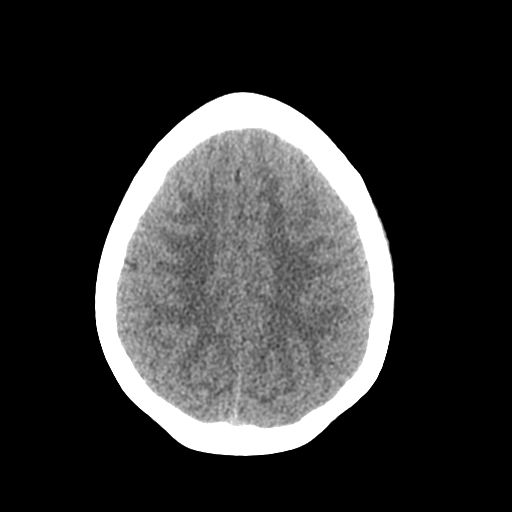
[im 22/29  brain]
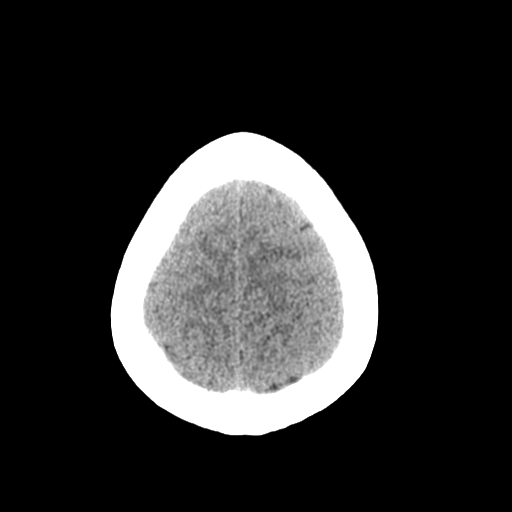
[im 24/29  brain]
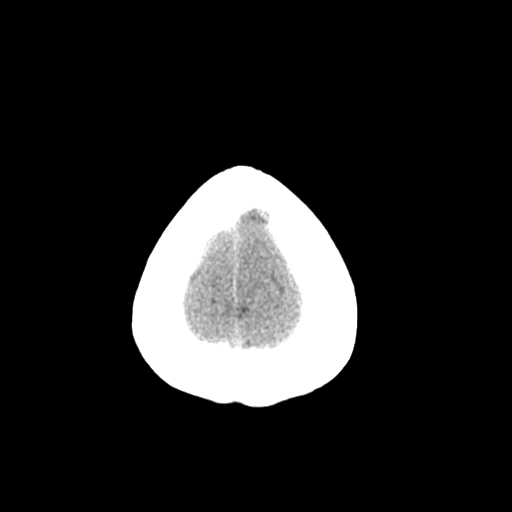
[im 24/29  bone]
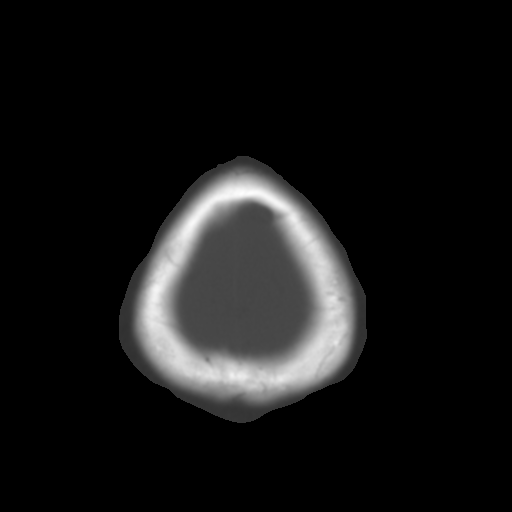
[im 27/29  brain]
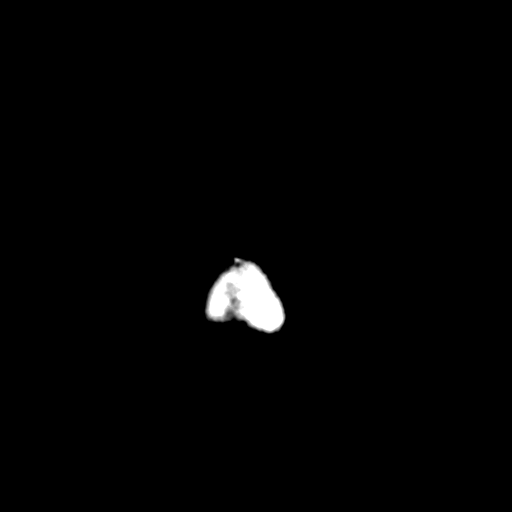

[Series 4: coronal soft · coronal · 0.30mm/px · 3 of 63 slices shown]
[im 21/63  brain]
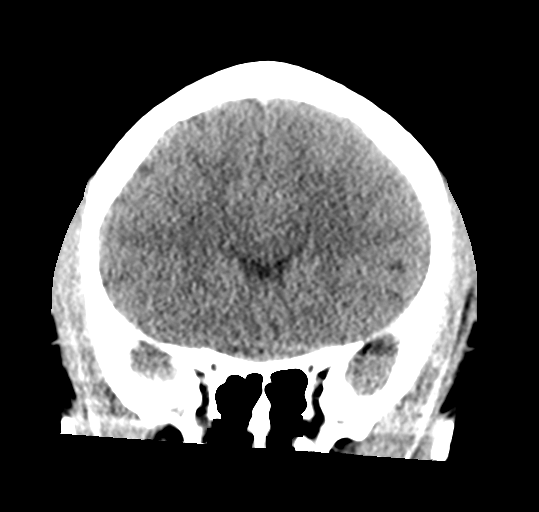
[im 28/63  brain]
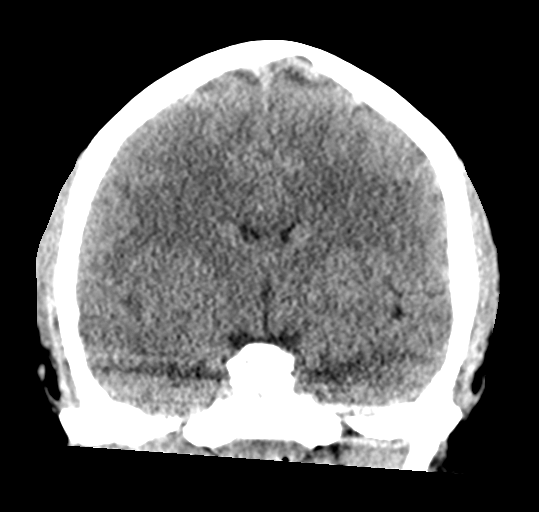
[im 35/63  brain]
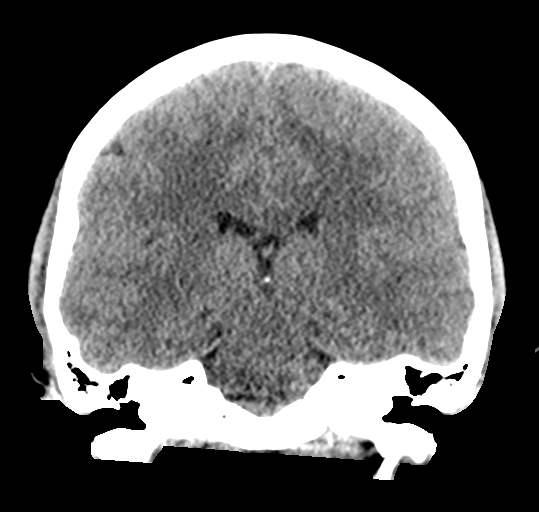

[Series 5: sagittal soft · sagittal · 0.30mm/px · 3 of 54 slices shown]
[im 18/54  brain]
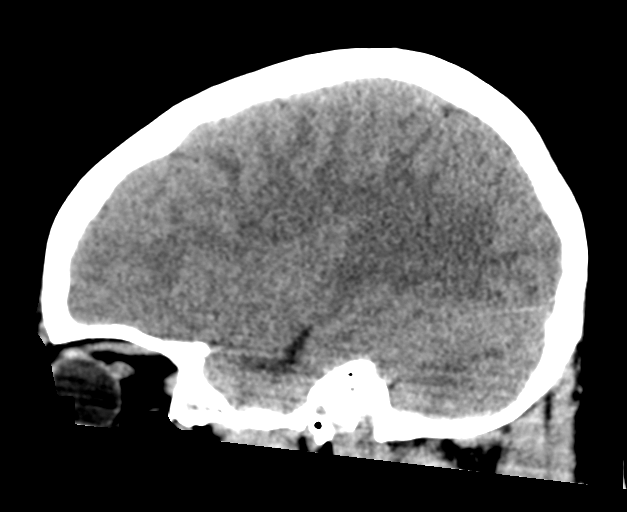
[im 27/54  brain]
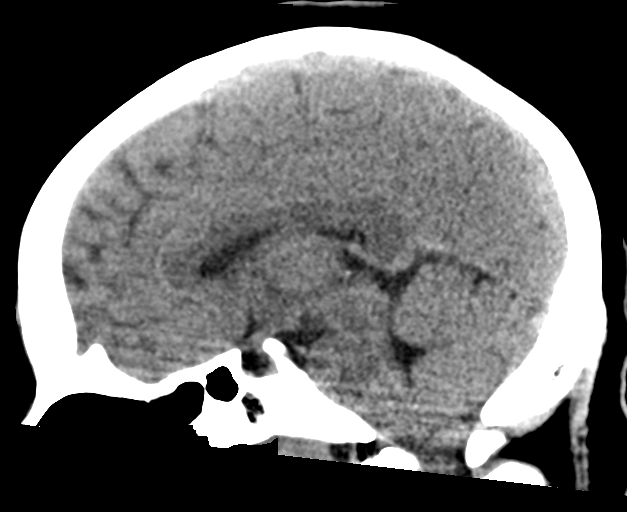
[im 36/54  brain]
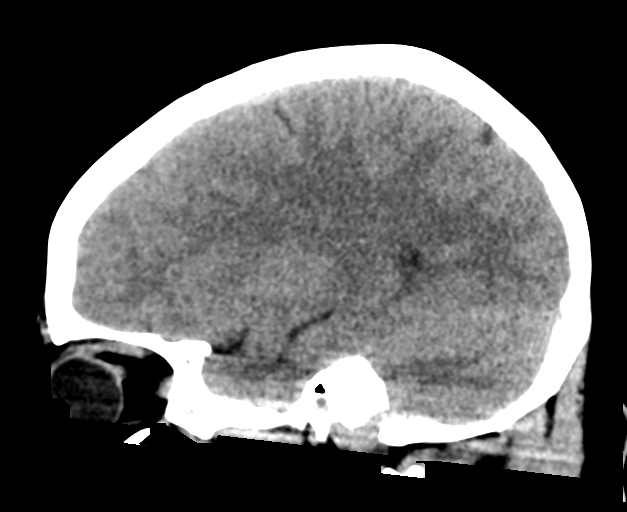

[16 of 46 positions shown; findings below may reference images not displayed]

FINDINGS: Brain: No evidence of acute infarction, hemorrhage, hydrocephalus,
extra-axial collection or mass lesion/mass effect.

Vascular: No hyperdense vessel or unexpected calcification.

Skull: Normal. Negative for fracture or focal lesion.

Sinuses/Orbits: No acute finding.

Other: None
IMPRESSION: Negative non contrasted CT appearance of the brain

## 2020-09-15 ENCOUNTER — Encounter (HOSPITAL_COMMUNITY): Payer: Self-pay

## 2020-09-15 ENCOUNTER — Other Ambulatory Visit: Payer: Self-pay

## 2020-09-15 ENCOUNTER — Emergency Department (HOSPITAL_COMMUNITY)
Admission: EM | Admit: 2020-09-15 | Discharge: 2020-09-15 | Disposition: A | Discharge status: Home / Self Care | Payer: Medicaid Other | Attending: Emergency Medicine | Admitting: Emergency Medicine

## 2020-09-15 DIAGNOSIS — R300 Dysuria: Secondary | ICD-10-CM | POA: Insufficient documentation

## 2020-09-15 DIAGNOSIS — M545 Low back pain, unspecified: Secondary | ICD-10-CM | POA: Insufficient documentation

## 2020-09-15 DIAGNOSIS — F17210 Nicotine dependence, cigarettes, uncomplicated: Secondary | ICD-10-CM | POA: Insufficient documentation

## 2020-09-15 DIAGNOSIS — N939 Abnormal uterine and vaginal bleeding, unspecified: Secondary | ICD-10-CM | POA: Insufficient documentation

## 2020-09-15 DIAGNOSIS — R109 Unspecified abdominal pain: Secondary | ICD-10-CM | POA: Diagnosis not present

## 2020-09-15 DIAGNOSIS — F1590 Other stimulant use, unspecified, uncomplicated: Secondary | ICD-10-CM | POA: Diagnosis not present

## 2020-09-15 DIAGNOSIS — Z91040 Latex allergy status: Secondary | ICD-10-CM | POA: Insufficient documentation

## 2020-09-15 LAB — URINALYSIS, ROUTINE W REFLEX MICROSCOPIC
Bilirubin Urine: NEGATIVE
Glucose, UA: NEGATIVE mg/dL
Hgb urine dipstick: NEGATIVE
Ketones, ur: NEGATIVE mg/dL
Nitrite: NEGATIVE
Protein, ur: NEGATIVE mg/dL
Specific Gravity, Urine: 1.019 (ref 1.005–1.030)
pH: 6 (ref 5.0–8.0)

## 2020-09-15 LAB — POC URINE PREG, ED: Preg Test, Ur: NEGATIVE

## 2020-09-15 MED ORDER — NAPROXEN 375 MG PO TABS: 375.0000 mg | ORAL_TABLET | Freq: Two times a day (BID) | ORAL | 0 refills | Status: DC

## 2020-09-15 MED ORDER — CYCLOBENZAPRINE HCL 10 MG PO TABS: 10.0000 mg | ORAL_TABLET | Freq: Two times a day (BID) | ORAL | 0 refills | Status: DC | PRN

## 2020-09-15 NOTE — ED Provider Notes (Signed)
[facility] EMERGENCY DEPARTMENT Provider Note  CSN: [id number] Arrival date & time: 09/15/20 0746    History Chief Complaint  Patient presents with   Flank Pain    HPI  Bethany Gomez is a 29 y.o. female reports 2 weeks of intermittent but persistent bilateral lower back pain. She has had a UTI in the past but isn't sure if this is similar. She has had some mild dysuria. Some vaginal spotting she attributed to her Nexplanon. She denies any fever or vomiting. Her son was sick with adenovirus and then she got it too recently, but she has since recovered from that. No history of back problems or recent heavy lifting. Pain does not radiating to her abdomen or legs but she does have occasional bloating and discomfort in her RUQ after eating which she feels is unrelated to her back pain.    Past Medical History:  Diagnosis Date   Headaches, cluster    Mono exposure    Premenstrual dysphoric disorder    UTI (lower urinary tract infection)     Past Surgical History:  Procedure Laterality Date   NO PAST SURGERIES      Family History  Problem Relation Age of Onset   Diabetes Mother    Fibromyalgia Mother    Hypertension Mother    Congestive Heart Failure Father    Narcolepsy Father    Aneurysm Father    Congestive Heart Failure Paternal Uncle    Diabetes Maternal Grandmother    Congestive Heart Failure Maternal Grandmother    Diabetes Maternal Grandfather    Congestive Heart Failure Maternal Grandfather    Diabetes Paternal Grandmother    Diabetes Paternal Grandfather    Congestive Heart Failure Paternal Grandfather     Social History   Tobacco Use   Smoking status: Current Every Day Smoker    Packs/day: 0.50    Years: 3.00    Pack years: 1.50    Types: Cigarettes   Smokeless tobacco: Never Used  Building services engineer Use: Never used  Substance Use Topics   Alcohol use: Yes    Comment: rarely    Drug use: Yes    Types: Marijuana      Home Medications Prior to Admission medications   Medication Sig Start Date End Date Taking? Authorizing Provider  cyclobenzaprine (FLEXERIL) 10 MG tablet Take 1 tablet (10 mg total) by mouth 2 (two) times daily as needed for muscle spasms. 09/15/20   Pollyann Savoy, MD  naproxen (NAPROSYN) 375 MG tablet Take 1 tablet (375 mg total) by mouth 2 (two) times daily. 09/15/20   Pollyann Savoy, MD     Allergies    Latex   Review of Systems   Review of Systems A comprehensive review of systems was completed and negative except as noted in HPI.    Physical Exam BP (!) 157/110 (BP Location: Right Arm)    Pulse 91    Temp 97.9 F (36.6 C) (Oral)    Resp 20    Ht 5\' 3"  (1.6 m)    Wt 81.6 kg    SpO2 100%    BMI 31.89 kg/m   Physical Exam Vitals and nursing note reviewed.  Constitutional:      Appearance: Normal appearance.  HENT:     Head: Normocephalic and atraumatic.     Nose: Nose normal.     Mouth/Throat:     Mouth: Mucous membranes are moist.  Eyes:     Extraocular Movements:  Extraocular movements intact.     Conjunctiva/sclera: Conjunctivae normal.  Cardiovascular:     Rate and Rhythm: Normal rate.  Pulmonary:     Effort: Pulmonary effort is normal.     Breath sounds: Normal breath sounds.  Abdominal:     General: Abdomen is flat.     Palpations: Abdomen is soft.     Tenderness: There is no abdominal tenderness. There is no guarding.  Genitourinary:    Comments: No CVA tenderness Musculoskeletal:        General: No swelling. Normal range of motion.     Cervical back: Neck supple.     Comments: No tenderness in lower back  Skin:    General: Skin is warm and dry.  Neurological:     General: No focal deficit present.     Mental Status: She is alert.  Psychiatric:        Mood and Affect: Mood normal.      ED Results / Procedures / Treatments   Labs (all labs ordered are listed, but only abnormal results are displayed) Labs Reviewed  URINALYSIS,  ROUTINE W REFLEX MICROSCOPIC - Abnormal; Notable for the following components:      Result Value   APPearance HAZY (*)    Leukocytes,Ua TRACE (*)    Bacteria, UA RARE (*)    All other components within normal limits  POC URINE PREG, ED    EKG None  Radiology No results found.  Procedures Procedures  Medications Ordered in the ED Medications - No data to display   MDM Rules/Calculators/A&P MDM Patient with 2 weeks of bilateral lower back pain. Preg is negative and UA is not concerning for infection or significant hematuria to suggest pyelonephritis or kidney stone. Her abdomen is benign on exam today.    ED Course  I have reviewed the triage vital signs and the nursing notes.  Pertinent labs & imaging results that were available during my care of the patient were reviewed by me and considered in my medical decision making (see chart for details).  Clinical Course as of 09/15/20 0901  Tue Sep 15, 2020  0851 With neg UA will treat as MSK back pain, Naprosyn, flexeril and PCP follow up. RTED if symptoms worsen or for any other concerns.  [CS]    Clinical Course User Index [CS] Pollyann Savoy, MD    Final Clinical Impression(s) / ED Diagnoses Final diagnoses:  Acute bilateral low back pain without sciatica    Rx / DC Orders ED Discharge Orders         Ordered    naproxen (NAPROSYN) 375 MG tablet  2 times daily        09/15/20 0901    cyclobenzaprine (FLEXERIL) 10 MG tablet  2 times daily PRN        09/15/20 0901           Pollyann Savoy, MD 09/15/20 [phone]

## 2020-09-15 NOTE — ED Triage Notes (Signed)
bilat flank pain with lower back pain that started 2 weeks ago

## 2020-12-07 ENCOUNTER — Ambulatory Visit (INDEPENDENT_AMBULATORY_CARE_PROVIDER_SITE_OTHER): Payer: Medicaid Other | Admitting: Nurse Practitioner

## 2020-12-07 ENCOUNTER — Encounter: Payer: Self-pay | Admitting: Nurse Practitioner

## 2020-12-07 ENCOUNTER — Other Ambulatory Visit: Payer: Self-pay

## 2020-12-07 VITALS — BP 162/107 | HR 88 | Temp 99.0°F | Resp 18 | Ht 63.5 in | Wt 191.0 lb

## 2020-12-07 DIAGNOSIS — F1210 Cannabis abuse, uncomplicated: Secondary | ICD-10-CM | POA: Diagnosis not present

## 2020-12-07 DIAGNOSIS — Z139 Encounter for screening, unspecified: Secondary | ICD-10-CM | POA: Diagnosis not present

## 2020-12-07 DIAGNOSIS — Z7689 Persons encountering health services in other specified circumstances: Secondary | ICD-10-CM

## 2020-12-07 DIAGNOSIS — I10 Essential (primary) hypertension: Secondary | ICD-10-CM | POA: Insufficient documentation

## 2020-12-07 MED ORDER — AMLODIPINE BESYLATE 5 MG PO TABS: 5.0000 mg | ORAL_TABLET | Freq: Every day | ORAL | 3 refills | Status: DC

## 2020-12-07 NOTE — Progress Notes (Signed)
New Patient Office Visit  Subjective:  Patient ID: Bethany Gomez, female    DOB: 1990-12-14  Age: 30 y.o. MRN: [id number]  CC:  Chief Complaint  Patient presents with  . New Patient (Initial Visit)    HPI Bethany Gomez presents for new patient visit. No recent PCP. She had a P[facility] smear [facility] health department, and she gets those yearly.  She is here today because she has noticed that her BP has been elevated. She states she was dizzy and she was vomiting and when she went to ED her BP was elevated. She had pre-eclampsia and that is when she noticed the BP issue started.  She states her home Lesly Rubenstein has been in the 190s/100s.  Past Medical History:  Diagnosis Date  . Headaches, cluster   . Hypertension   . 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 Premenstrual dysphoric disorder   . UTI (lower urinary tract infection)     Past Surgical History:  Procedure Laterality Date  . NO PAST SURGERIES      Family History  Problem Relation Age of Onset  . Diabetes Mother   . Fibromyalgia Mother   . Hypertension Mother   . Congestive Heart Failure Father   . Narcolepsy Father   . Aneurysm Father   . Congestive Heart Failure Paternal Uncle   . Diabetes Maternal Grandmother   . Congestive Heart Failure Maternal Grandmother   . Diabetes Maternal Grandfather   . Congestive Heart Failure Maternal Grandfather   . Diabetes Paternal Grandmother   . Diabetes Paternal Grandfather   . Congestive Heart Failure Paternal Grandfather     Social History   Socioeconomic History  . Marital status: Single    Spouse name: Not on file  . Number of children: Not on file  . Years of education: Not on file  . Highest education level: Not on file  Occupational History  . Occupation: Pension scheme manager    Comment: [employer]  Tobacco Use  . Smoking status: Current Every Day Smoker    Packs/day: 1.00    Years: 3.00    Pack years: 3.00    Types: Cigarettes  . Smokeless tobacco: Never Used  Vaping Use  . Vaping Use:  Never used  Substance and Sexual Activity  . Alcohol use: Yes    Comment: rarely in social setting; maybe once per month  . Drug use: Yes    Types: Marijuana    Comment: at least once per day  . Sexual activity: Yes    Birth control/protection: Implant    Comment: nexplanon- [facility]  Other Topics Concern  . Not on file  Social History Narrative  . Not on file   Social Determinants of Health   Financial Resource Strain: Not on file  Food Insecurity: Not on file  Transportation Needs: Not on file  Physical Activity: Not on file  Stress: Not on file  Social Connections: Not on file  Intimate Partner Violence: Not on file    ROS Review of Systems  Constitutional: Negative.   Respiratory: Positive for chest tightness. Negative for cough, shortness of breath and wheezing.        Chest tightness when BP is elevated  Cardiovascular: Positive for chest pain.       Only has chest tightness when BP is elevated  Neurological: Positive for headaches.    Objective:   Today's Vitals: BP (!) 162/107   Pulse 88   Temp 99 F (37.2 C)  Resp 18   Ht 5' 3.5" (1.613 m)   Wt 191 lb (86.6 kg)   SpO2 94%   BMI 33.30 kg/m   Physical Exam Constitutional:      Appearance: She is obese.  Cardiovascular:     Rate and Rhythm: Normal rate and regular rhythm.     Pulses: Normal pulses.     Heart sounds: Normal heart sounds.  Pulmonary:     Effort: Pulmonary effort is normal.     Breath sounds: Normal breath sounds.  Neurological:     Mental Status: She is alert.  Psychiatric:        Mood and Affect: Mood normal.        Behavior: Behavior normal.        Thought Content: Thought content normal.        Judgment: Judgment normal.     Assessment & Plan:   Problem List Items Addressed This Visit      Cardiovascular and Mediastinum   High blood pressure    -Home BPs have been in the 190s/100s, and BP elevated today -Rx. amlodipine      Relevant Medications    amLODipine (NORVASC) 5 MG tablet     Other   Encounter to establish care    -no recent PCP -has GYN care through health dept, but had nexplanon implanted by [facility]      Relevant Orders   CBC with Differential/Platelet   CMP14+EGFR   HCV Ab w/Rflx to Verification   Lipid Panel With LDL/HDL Ratio   Cannabis use disorder, mild, abuse    -smokes at least 1x per day -no desire to quit at this time        Other Visit Diagnoses    Screening due    -  Primary   Relevant Orders   HCV Ab w/Rflx to Verification      Outpatient Encounter Medications as of 12/07/2020  Medication Sig  . amLODipine (NORVASC) 5 MG tablet Take 1 tablet (5 mg total) by mouth daily.  . cyclobenzaprine (FLEXERIL) 10 MG tablet Take 1 tablet (10 mg total) by mouth 2 (two) times daily as needed for muscle spasms.  . naproxen (NAPROSYN) 375 MG tablet Take 1 tablet (375 mg total) by mouth 2 (two) times daily.   No facility-administered encounter medications on file as of 12/07/2020.    Follow-up: Return in about 10 days (around 12/17/2020) for Physical Exam.   Noreene Larsson, NP

## 2020-12-07 NOTE — Assessment & Plan Note (Signed)
-  Home BPs have been in the 190s/100s, and BP elevated today -Rx. amlodipine

## 2020-12-07 NOTE — Assessment & Plan Note (Signed)
-  no recent PCP -has GYN care through health dept, but had nexplanon implanted by [facility]

## 2020-12-07 NOTE — Assessment & Plan Note (Addendum)
-  smokes at least 1x per day -no desire to quit at this time

## 2020-12-21 ENCOUNTER — Ambulatory Visit (INDEPENDENT_AMBULATORY_CARE_PROVIDER_SITE_OTHER): Payer: Medicaid Other | Admitting: Nurse Practitioner

## 2020-12-21 ENCOUNTER — Encounter: Payer: Self-pay | Admitting: Nurse Practitioner

## 2020-12-21 ENCOUNTER — Other Ambulatory Visit: Payer: Self-pay

## 2020-12-21 VITALS — BP 151/100 | HR 90 | Temp 99.7°F | Resp 18 | Ht 63.5 in | Wt 188.0 lb

## 2020-12-21 DIAGNOSIS — Z139 Encounter for screening, unspecified: Secondary | ICD-10-CM

## 2020-12-21 DIAGNOSIS — I10 Essential (primary) hypertension: Secondary | ICD-10-CM | POA: Diagnosis not present

## 2020-12-21 DIAGNOSIS — J302 Other seasonal allergic rhinitis: Secondary | ICD-10-CM | POA: Diagnosis not present

## 2020-12-21 DIAGNOSIS — Z0000 Encounter for general adult medical examination without abnormal findings: Secondary | ICD-10-CM | POA: Diagnosis not present

## 2020-12-21 MED ORDER — CETIRIZINE HCL 10 MG PO TABS: 10.0000 mg | ORAL_TABLET | Freq: Every day | ORAL | 11 refills | Status: DC

## 2020-12-21 MED ORDER — AMLODIPINE BESYLATE 10 MG PO TABS: 10.0000 mg | ORAL_TABLET | Freq: Every day | ORAL | 1 refills | Status: DC

## 2020-12-21 NOTE — Progress Notes (Signed)
Established Patient Office Visit  Subjective:  Patient ID: Bethany Gomez, female    DOB: 03/13/1991  Age: 30 y.o. MRN: [id number]  CC:  Chief Complaint  Patient presents with  . Annual Exam    HPI Bethany Gomez presents for physical exam. At last OV, her BP was 162/107, so she was started on amlodipine. No adverse medication effects. She states that she has been having itching in her throat that is making her cough. She used a nebulizer that her son had, and that helped.  Past Medical History:  Diagnosis Date  . Headaches, cluster   . Hypertension   . 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 Premenstrual dysphoric disorder   . UTI (lower urinary tract infection)     Past Surgical History:  Procedure Laterality Date  . NO PAST SURGERIES      Family History  Problem Relation Age of Onset  . Diabetes Mother   . Fibromyalgia Mother   . Hypertension Mother   . Congestive Heart Failure Father   . Narcolepsy Father   . Aneurysm Father   . Congestive Heart Failure Paternal Uncle   . Diabetes Maternal Grandmother   . Congestive Heart Failure Maternal Grandmother   . Diabetes Maternal Grandfather   . Congestive Heart Failure Maternal Grandfather   . Diabetes Paternal Grandmother   . Diabetes Paternal Grandfather   . Congestive Heart Failure Paternal Grandfather     Social History   Socioeconomic History  . Marital status: Single    Spouse name: Not on file  . Number of children: Not on file  . Years of education: Not on file  . Highest education level: Not on file  Occupational History  . Occupation: Scientist, product/process development    Comment: [employer]  Tobacco Use  . Smoking status: Current Every Day Smoker    Packs/day: 1.00    Years: 3.00    Pack years: 3.00    Types: Cigarettes  . Smokeless tobacco: Never Used  Vaping Use  . Vaping Use: Never used  Substance and Sexual Activity  . Alcohol use: Yes    Comment: rarely in social setting; maybe once per month  . Drug use: Yes    Types:  Marijuana    Comment: at least once per day  . Sexual activity: Yes    Birth control/protection: Implant    Comment: nex[facility]on- [facility]  Other Topics Concern  . Not on file  Social History Narrative  . Not on file   Social Determinants of Health   Financial Resource Strain: Not on file  Food Insecurity: Not on file  Transportation Needs: Not on file  Physical Activity: Not on file  Stress: Not on file  Social Connections: Not on file  Intimate Partner Violence: Not on file    Outpatient Medications Prior to Visit  Medication Sig Dispense Refill  . amLODipine (NORVASC) 5 MG tablet Take 1 tablet (5 mg total) by mouth daily. 90 tablet 3  . cyclobenzaprine (FLEXERIL) 10 MG tablet Take 1 tablet (10 mg total) by mouth 2 (two) times daily as needed for muscle spasms. 20 tablet 0  . naproxen (NAPROSYN) 375 MG tablet Take 1 tablet (375 mg total) by mouth 2 (two) times daily. 20 tablet 0   No facility-administered medications prior to visit.    Allergies  Allergen Reactions  . Latex Hives, Itching and Other (See Comments)    Reaction:  Burning    ROS Review of Systems  Constitutional:  Negative.   HENT: Negative.   Eyes: Negative.   Respiratory: Positive for cough. Negative for chest tightness, shortness of breath and wheezing.        Feels tickle/itching in her throat that causes a cough  Cardiovascular: Negative.   Gastrointestinal: Negative.   Endocrine: Negative.   Genitourinary: Negative.   Musculoskeletal: Negative.        Left upper arm pain sometimes; she state it is in her bicep and radiates up her arm; that is where her nexplanon implant is located  Allergic/Immunologic: Negative.   Neurological: Negative.   Hematological: Negative.   Psychiatric/Behavioral: Negative.       Objective:    Physical Exam Constitutional:      Appearance: She is obese.  HENT:     Head: Normocephalic and atraumatic.     Right Ear: Tympanic membrane, ear canal  and external ear normal.     Left Ear: Tympanic membrane, ear canal and external ear normal.     Nose: Nose normal.     Mouth/Throat:     Mouth: Mucous membranes are moist.     Pharynx: Oropharynx is clear.  Eyes:     Extraocular Movements: Extraocular movements intact.     Conjunctiva/sclera: Conjunctivae normal.     Pupils: Pupils are equal, round, and reactive to light.  Cardiovascular:     Rate and Rhythm: Normal rate and regular rhythm.     Pulses: Normal pulses.     Heart sounds: Normal heart sounds.  Pulmonary:     Effort: Pulmonary effort is normal.     Breath sounds: Normal breath sounds.  Abdominal:     General: Abdomen is flat. Bowel sounds are normal.     Palpations: Abdomen is soft.  Musculoskeletal:        General: Normal range of motion.     Cervical back: Normal range of motion and neck supple.     Comments: No pain with elbow or shoulder ROM exercises  Skin:    General: Skin is warm and dry.     Capillary Refill: Capillary refill takes less than 2 seconds.  Neurological:     General: No focal deficit present.     Mental Status: She is alert and oriented to person, place, and time.     Cranial Nerves: No cranial nerve deficit.     Sensory: No sensory deficit.     Motor: No weakness.     Coordination: Coordination normal.     Gait: Gait normal.  Psychiatric:        Mood and Affect: Mood normal.        Behavior: Behavior normal.        Thought Content: Thought content normal.        Judgment: Judgment normal.     BP (!) 151/100   Pulse 90   Temp 99.7 F (37.6 C)   Resp 18   Ht 5' 3.5" (1.613 m)   Wt 188 lb (85.3 kg)   SpO2 98%   BMI 32.78 kg/m  Wt Readings from Last 3 Encounters:  12/21/20 188 lb (85.3 kg)  12/07/20 191 lb (86.6 kg)  09/15/20 180 lb (81.6 kg)     Health Maintenance Due  Topic Date Due  . Hepatitis C Screening  Never done  . COVID-19 Vaccine (1) Never done  . PAP-Cervical Cytology Screening  Never done  . PAP  SMEAR-Modifier  Never done    There are no preventive care reminders to display for this  patient.  No results found for: TSH Lab Results  Component Value Date   WBC 7.5 01/28/2020   HGB 13.0 01/28/2020   HCT 42.0 01/28/2020   MCV 87.9 01/28/2020   PLT 328 01/28/2020   Lab Results  Component Value Date   NA 138 01/28/2020   K 3.4 (L) 01/28/2020   CO2 21 (L) 01/28/2020   GLUCOSE 76 01/28/2020   BUN 13 01/28/2020   CREATININE 0.70 01/28/2020   BILITOT 0.7 01/28/2020   ALKPHOS 47 01/28/2020   AST 21 01/28/2020   ALT 22 01/28/2020   PROT 7.9 01/28/2020   ALBUMIN 4.3 01/28/2020   CALCIUM 8.9 01/28/2020   ANIONGAP 8 01/28/2020   No results found for: CHOL No results found for: HDL No results found for: LDLCALC No results found for: TRIG No results found for: CHOLHDL No results found for: ASTM1D    Assessment & Plan:   Problem List Items Addressed This Visit   None     No orders of the defined types were placed in this encounter.   Follow-up: No follow-ups on file.    Heather Roberts, NP

## 2020-12-21 NOTE — Assessment & Plan Note (Signed)
-  Rx. Zyrtec -discussed OTC flonase if zyrtec doesn't help -if neither of those help, return to clinic

## 2020-12-21 NOTE — Assessment & Plan Note (Signed)
-  routine labs today

## 2020-12-21 NOTE — Patient Instructions (Addendum)
If zyrtec doesn't help enough, try over the counter flonase nose spray to help with your allergy symptoms. If those combined don't help, return to the clinic.  Please have fasting labs drawn today.

## 2020-12-21 NOTE — Assessment & Plan Note (Signed)
BP Readings from Last 3 Encounters:  12/21/20 (!) 151/100  12/07/20 (!) 162/107  09/15/20 (!) 156/89   -BP elevated today -INCREASE amlodipine to 10 mg; discussed side effects like leg swelling, and she will return to clinic if this develops

## 2020-12-22 LAB — CBC WITH DIFFERENTIAL/PLATELET
Basophils Absolute: 0.1 10*3/uL (ref 0.0–0.2)
Basos: 1 %
EOS (ABSOLUTE): 0.1 10*3/uL (ref 0.0–0.4)
Eos: 1 %
Hematocrit: 43.1 % (ref 34.0–46.6)
Hemoglobin: 13.8 g/dL (ref 11.1–15.9)
Immature Grans (Abs): 0 10*3/uL (ref 0.0–0.1)
Immature Granulocytes: 1 %
Lymphocytes Absolute: 2.8 10*3/uL (ref 0.7–3.1)
Lymphs: 33 %
MCH: 26.8 pg (ref 26.6–33.0)
MCHC: 32 g/dL (ref 31.5–35.7)
MCV: 84 fL (ref 79–97)
Monocytes Absolute: 0.5 10*3/uL (ref 0.1–0.9)
Monocytes: 6 %
Neutrophils Absolute: 5 10*3/uL (ref 1.4–7.0)
Neutrophils: 58 %
Platelets: 368 10*3/uL (ref 150–450)
RBC: 5.14 x10E6/uL (ref 3.77–5.28)
RDW: 12.8 % (ref 11.7–15.4)
WBC: 8.5 10*3/uL (ref 3.4–10.8)

## 2020-12-22 LAB — CMP14+EGFR
ALT: 27 IU/L (ref 0–32)
AST: 22 IU/L (ref 0–40)
Albumin/Globulin Ratio: 1.3 (ref 1.2–2.2)
Albumin: 4.3 g/dL (ref 3.9–5.0)
Alkaline Phosphatase: 71 IU/L (ref 44–121)
BUN/Creatinine Ratio: 15 (ref 9–23)
BUN: 12 mg/dL (ref 6–20)
Bilirubin Total: <0.2 mg/dL (ref 0.0–1.2)
CO2: 22 mmol/L (ref 20–29)
Calcium: 9.8 mg/dL (ref 8.7–10.2)
Chloride: 103 mmol/L (ref 96–106)
Creatinine, Ser: 0.82 mg/dL (ref 0.57–1.00)
Globulin, Total: 3.3 g/dL (ref 1.5–4.5)
Glucose: 92 mg/dL (ref 65–99)
Potassium: 4.7 mmol/L (ref 3.5–5.2)
Sodium: 139 mmol/L (ref 134–144)
Total Protein: 7.6 g/dL (ref 6.0–8.5)
eGFR: 100 mL/min/{1.73_m2} (ref >59)

## 2020-12-22 LAB — HCV INTERPRETATION

## 2020-12-22 LAB — LIPID PANEL WITH LDL/HDL RATIO
Cholesterol, Total: 185 mg/dL (ref 100–199)
HDL: 40 mg/dL (ref >39)
LDL Chol Calc (NIH): 131 mg/dL — ABNORMAL HIGH (ref 0–99)
LDL/HDL Ratio: 3.3 ratio — ABNORMAL HIGH (ref 0.0–3.2)
Triglycerides: 74 mg/dL (ref 0–149)
VLDL Cholesterol Cal: 14 mg/dL (ref 5–40)

## 2020-12-22 LAB — HCV AB W/RFLX TO VERIFICATION: HCV Ab: <0.1 s/co ratio (ref 0.0–0.9)

## 2020-12-22 NOTE — Progress Notes (Signed)
LDL, or bad cholesterol, were elevated at 131. Cut back fried and fatty foods. We won't start medicine at this time, but make diet changes and we will check cholesterol again after diet changes have kicked in.

## 2021-03-23 ENCOUNTER — Encounter: Payer: Self-pay | Admitting: Nurse Practitioner

## 2021-03-23 ENCOUNTER — Ambulatory Visit (INDEPENDENT_AMBULATORY_CARE_PROVIDER_SITE_OTHER): Payer: Medicaid Other | Admitting: Nurse Practitioner

## 2021-03-23 ENCOUNTER — Other Ambulatory Visit: Payer: Self-pay

## 2021-03-23 VITALS — BP 162/114 | HR 94 | Temp 97.6°F | Ht 63.0 in | Wt 188.0 lb

## 2021-03-23 DIAGNOSIS — M25562 Pain in left knee: Secondary | ICD-10-CM

## 2021-03-23 DIAGNOSIS — I10 Essential (primary) hypertension: Secondary | ICD-10-CM

## 2021-03-23 DIAGNOSIS — A749 Chlamydial infection, unspecified: Secondary | ICD-10-CM | POA: Insufficient documentation

## 2021-03-23 NOTE — Progress Notes (Signed)
Established Patient Office Visit  Subjective:  Patient ID: KLEIGH HOELZER, female    DOB: 1991-07-16  Age: 30 y.o. MRN: [id number]  CC:  Chief Complaint  Patient presents with   Hypertension    Follow up   Knee Pain    L knee pain, fell x2-3 weeks ago   Exposure to STD    Recently had chlamydia, wants to make sure this is resolved.    HPI Bethany Gomez presents for BP check. At her last OV< her BP was 151/100, so we increased her amlodipine to 10 mg. She states her son had surgery, lost her job, and grandmother died. She states her headaches have stopped.  She states that she fell 2-3 weeks ago and has residual left knee pain.   Past Medical History:  Diagnosis Date   Headaches, cluster    History of gestational hypertension 02/09/2017   History of prior pregnancy with IUGR newborn 03/20/2017   3lb14oz @ 37wks      Hypertension    Mono exposure    Premenstrual dysphoric disorder    UTI (lower urinary tract infection)     Past Surgical History:  Procedure Laterality Date   NO PAST SURGERIES      Family History  Problem Relation Age of Onset   Diabetes Mother    Fibromyalgia Mother    Hypertension Mother    Congestive Heart Failure Father    Narcolepsy Father    Aneurysm Father    Congestive Heart Failure Paternal Uncle    Diabetes Maternal Grandmother    Congestive Heart Failure Maternal Grandmother    Diabetes Maternal Grandfather    Congestive Heart Failure Maternal Grandfather    Diabetes Paternal Grandmother    Diabetes Paternal Grandfather    Congestive Heart Failure Paternal Grandfather     Social History   Socioeconomic History   Marital status: Single    Spouse name: Not on file   Number of children: Not on file   Years of education: Not on file   Highest education level: Not on file  Occupational History   Occupation: Mail order pharmacist    Comment: [employer]  Tobacco Use   Smoking status: Every Day    Packs/day: 1.00    Years: 3.00     Pack years: 3.00    Types: Cigarettes   Smokeless tobacco: Never  Vaping Use   Vaping Use: Never used  Substance and Sexual Activity   Alcohol use: Yes    Comment: rarely in social setting; maybe once per month   Drug use: Yes    Types: Marijuana    Comment: at least once per day   Sexual activity: Yes    Birth control/protection: Implant    Comment: [facility]- [facility]  Other Topics Concern   Not on file  Social History Narrative   Not on file   Social Determinants of Health   Financial Resource Strain: Not on file  Food Insecurity: Not on file  Transportation Needs: Not on file  Physical Activity: Not on file  Stress: Not on file  Social Connections: Not on file  Intimate Partner Violence: Not on file    Outpatient Medications Prior to Visit  Medication Sig Dispense Refill   amLODipine (NORVASC) 10 MG tablet Take 1 tablet (10 mg total) by mouth daily. 90 tablet 1   cetirizine (ZYRTEC) 10 MG tablet Take 1 tablet (10 mg total) by mouth daily. 30 tablet 11   cyclobenzaprine (FLEXERIL) 10  MG tablet Take 1 tablet (10 mg total) by mouth 2 (two) times daily as needed for muscle spasms. 20 tablet 0   naproxen (NAPROSYN) 375 MG tablet Take 1 tablet (375 mg total) by mouth 2 (two) times daily. 20 tablet 0   No facility-administered medications prior to visit.    Allergies  Allergen Reactions   Latex Hives, Itching and Other (See Comments)    Reaction:  Burning    ROS Review of Systems  Constitutional: Negative.   Respiratory: Negative.    Cardiovascular: Negative.   Genitourinary:  Positive for vaginal bleeding.       Spotting  Musculoskeletal:        Left knee pain and scab after a fall     Objective:    Physical Exam Constitutional:      Appearance: Normal appearance. She is obese.  Cardiovascular:     Rate and Rhythm: Normal rate and regular rhythm.     Pulses: Normal pulses.     Heart sounds: Normal heart sounds.  Pulmonary:     Effort:  Pulmonary effort is normal.     Breath sounds: Normal breath sounds.  Musculoskeletal:        General: Tenderness and signs of injury present. No swelling.     Comments: To left knee  Neurological:     Mental Status: She is alert.  Psychiatric:        Mood and Affect: Mood normal.        Behavior: Behavior normal.        Thought Content: Thought content normal.        Judgment: Judgment normal.    BP (!) 162/114 (BP Location: Left Arm, Patient Position: Sitting, Cuff Size: Large)   Pulse 94   Temp 97.6 F (36.4 C) (Temporal)   Ht 5' 3"  (1.6 m)   Wt 188 lb (85.3 kg)   SpO2 97%   BMI 33.30 kg/m  Wt Readings from Last 3 Encounters:  03/23/21 188 lb (85.3 kg)  12/21/20 188 lb (85.3 kg)  12/07/20 191 lb (86.6 kg)     There are no preventive care reminders to display for this patient.   There are no preventive care reminders to display for this patient.  No results found for: TSH Lab Results  Component Value Date   WBC 8.5 12/21/2020   HGB 13.8 12/21/2020   HCT 43.1 12/21/2020   MCV 84 12/21/2020   PLT 368 12/21/2020   Lab Results  Component Value Date   NA 139 12/21/2020   K 4.7 12/21/2020   CO2 22 12/21/2020   GLUCOSE 92 12/21/2020   BUN 12 12/21/2020   CREATININE 0.82 12/21/2020   BILITOT <0.2 12/21/2020   ALKPHOS 71 12/21/2020   AST 22 12/21/2020   ALT 27 12/21/2020   PROT 7.6 12/21/2020   ALBUMIN 4.3 12/21/2020   CALCIUM 9.8 12/21/2020   ANIONGAP 8 01/28/2020   EGFR 100 12/21/2020   Lab Results  Component Value Date   CHOL 185 12/21/2020   Lab Results  Component Value Date   HDL 40 12/21/2020   Lab Results  Component Value Date   LDLCALC 131 (H) 12/21/2020   Lab Results  Component Value Date   TRIG 74 12/21/2020   No results found for: CHOLHDL No results found for: HGBA1C    Assessment & Plan:   Problem List Items Addressed This Visit       Cardiovascular and Mediastinum   High blood pressure - Primary  BP Readings from Last 3  Encounters:  03/23/21 (!) 162/114  12/21/20 (!) 151/100  12/07/20 (!) 162/107  -she states she hasn't been taking her BP medication -resume BP meds; recheck in 1 month       Relevant Orders   CBC with Differential/Platelet   CMP14+EGFR   Lipid Panel With LDL/HDL Ratio   HIV antibody (with reflex)     Other   Chlamydia    -she states that she was diagnosed with chlamydia since her last visit, and she took the abx -she states she has had some bleeding, which was a symptom that she had previously, so she would like a test for chlamydia -check GC/CL today       Relevant Orders   Chlamydia/Gonococcus/Trichomonas, NAA   Left knee pain    -fell 2-3 weeks ago on carpet at her mother's; has scab present today -will get x-ray to r/o bony injury -if pain persists or has fx, will consider ortho referral       Relevant Orders   DG Knee Complete 4 Views Left    No orders of the defined types were placed in this encounter.   Follow-up: Return in about 1 month (around 04/22/2021) for BP check.    Noreene Larsson, NP

## 2021-03-23 NOTE — Assessment & Plan Note (Addendum)
BP Readings from Last 3 Encounters:  03/23/21 (!) 162/114  12/21/20 (!) 151/100  12/07/20 (!) 162/107  -she states she hasn't been taking her BP medication -resume BP meds; recheck in 1 month

## 2021-03-23 NOTE — Assessment & Plan Note (Signed)
-  she states that she was diagnosed with chlamydia since her last visit, and she took the abx -she states she has had some bleeding, which was a symptom that she had previously, so she would like a test for chlamydia -check GC/CL today

## 2021-03-23 NOTE — Assessment & Plan Note (Signed)
-  fell 2-3 weeks ago on carpet at her mother's; has scab present today -will get x-ray to r/o bony injury -if pain persists or has fx, will consider ortho referral

## 2021-03-23 NOTE — Patient Instructions (Addendum)
We will collect urine today to check for STDs.  We will recheck BP in 1 month.  We will complete a physical exam in 6 months.  Please have fasting labs drawn 2-3 days prior to your physical so we can discuss the results during your office visit.

## 2021-03-26 ENCOUNTER — Other Ambulatory Visit: Payer: Self-pay | Admitting: Nurse Practitioner

## 2021-03-26 DIAGNOSIS — A749 Chlamydial infection, unspecified: Secondary | ICD-10-CM

## 2021-03-26 LAB — CHLAMYDIA/GONOCOCCUS/TRICHOMONAS, NAA
Chlamydia by NAA: POSITIVE — AB
Gonococcus by NAA: NEGATIVE
Trich vag by NAA: NEGATIVE

## 2021-03-26 MED ORDER — DOXYCYCLINE HYCLATE 100 MG PO TABS: 100.0000 mg | ORAL_TABLET | Freq: Two times a day (BID) | ORAL | 0 refills | Status: DC

## 2021-03-26 NOTE — Progress Notes (Signed)
Chlamydia came back positive, so I called in doxycycline, and antibiotic. Make sure to take it 2x per day for a week.

## 2021-04-23 ENCOUNTER — Telehealth (INDEPENDENT_AMBULATORY_CARE_PROVIDER_SITE_OTHER): Payer: Medicaid Other | Admitting: Nurse Practitioner

## 2021-04-23 ENCOUNTER — Encounter: Payer: Self-pay | Admitting: Nurse Practitioner

## 2021-04-23 ENCOUNTER — Other Ambulatory Visit: Payer: Self-pay

## 2021-04-23 ENCOUNTER — Ambulatory Visit: Payer: Medicaid Other | Admitting: Nurse Practitioner

## 2021-04-23 DIAGNOSIS — U071 COVID-19: Secondary | ICD-10-CM | POA: Diagnosis not present

## 2021-04-23 MED ORDER — NIRMATRELVIR/RITONAVIR (PAXLOVID)TABLET: 3.0000 | ORAL_TABLET | Freq: Two times a day (BID) | ORAL | 0 refills | Status: AC

## 2021-04-23 MED ORDER — BENZONATATE 100 MG PO CAPS: 100.0000 mg | ORAL_CAPSULE | Freq: Two times a day (BID) | ORAL | 0 refills | Status: DC | PRN

## 2021-04-23 MED ORDER — NOREL AD 4-10-325 MG PO TABS: 1.0000 | ORAL_TABLET | ORAL | 1 refills | Status: DC | PRN

## 2021-04-23 NOTE — Progress Notes (Signed)
Acute Office Visit  Subjective:    Patient ID: Bethany Gomez, female    DOB: 1991-06-07, 30 y.o.   MRN: [id number]  Chief Complaint  Patient presents with   Covid Positive    Tested positive on at home test Tuesday. Sinus congestion, cough, sweats, body aches, and chills. Has a bad headache that feels worse when coughing.     HPI Patient is in today for sick visit. She tested positive on 04/20/21 with a home test. She has not tired OTC meds.  Past Medical History:  Diagnosis Date   Headaches, cluster    History of gestational hypertension 02/09/2017   History of prior pregnancy with IUGR newborn 03/20/2017   3lb14oz @ 37wks      Hypertension    Mono exposure    Premenstrual dysphoric disorder    UTI (lower urinary tract infection)     Past Surgical History:  Procedure Laterality Date   NO PAST SURGERIES      Family History  Problem Relation Age of Onset   Diabetes Mother    Fibromyalgia Mother    Hypertension Mother    Congestive Heart Failure Father    Narcolepsy Father    Aneurysm Father    Congestive Heart Failure Paternal Uncle    Diabetes Maternal Grandmother    Congestive Heart Failure Maternal Grandmother    Diabetes Maternal Grandfather    Congestive Heart Failure Maternal Grandfather    Diabetes Paternal Grandmother    Diabetes Paternal Grandfather    Congestive Heart Failure Paternal Grandfather     Social History   Socioeconomic History   Marital status: Single    Spouse name: Not on file   Number of children: Not on file   Years of education: Not on file   Highest education level: Not on file  Occupational History   Occupation: Mail order pharmacist    Comment: [employer]  Tobacco Use   Smoking status: Every Day    Packs/day: 1.00    Years: 3.00    Pack years: 3.00    Types: Cigarettes   Smokeless tobacco: Never  Vaping Use   Vaping Use: Never used  Substance and Sexual Activity   Alcohol use: Yes    Comment: rarely in social setting;  maybe once per month   Drug use: Yes    Types: Marijuana    Comment: at least once per day   Sexual activity: Yes    Birth control/protection: Implant    Comment: [facility]on- [facility]  Other Topics Concern   Not on file  Social History Narrative   Not on file   Social Determinants of Health   Financial Resource Strain: Not on file  Food Insecurity: Not on file  Transportation Needs: Not on file  Physical Activity: Not on file  Stress: Not on file  Social Connections: Not on file  Intimate Partner Violence: Not on file    Outpatient Medications Prior to Visit  Medication Sig Dispense Refill   amLODipine (NORVASC) 10 MG tablet Take 1 tablet (10 mg total) by mouth daily. 90 tablet 1   cetirizine (ZYRTEC) 10 MG tablet Take 1 tablet (10 mg total) by mouth daily. 30 tablet 11   cyclobenzaprine (FLEXERIL) 10 MG tablet Take 1 tablet (10 mg total) by mouth 2 (two) times daily as needed for muscle spasms. (Patient not taking: Reported on 04/23/2021) 20 tablet 0   doxycycline (VIBRA-TABS) 100 MG tablet Take 1 tablet (100 mg total) by mouth 2 (  two) times daily. (Patient not taking: Reported on 04/23/2021) 14 tablet 0   naproxen (NAPROSYN) 375 MG tablet Take 1 tablet (375 mg total) by mouth 2 (two) times daily. (Patient not taking: Reported on 04/23/2021) 20 tablet 0   No facility-administered medications prior to visit.    Allergies  Allergen Reactions   Latex Hives, Itching and Other (See Comments)    Reaction:  Burning    Review of Systems  Constitutional:  Positive for chills, fatigue and fever.  HENT:  Positive for congestion, rhinorrhea and sore throat. Negative for sinus pressure and sinus pain.   Respiratory:  Positive for cough. Negative for shortness of breath and wheezing.   Cardiovascular: Negative.   Musculoskeletal:  Positive for myalgias.  Neurological:  Positive for headaches.      Objective:    Physical Exam  Ht 5' 3.5" (1.613 m)   Wt 170 lb (77.1  kg)   BMI 29.64 kg/m  Wt Readings from Last 3 Encounters:  04/23/21 170 lb (77.1 kg)  03/23/21 188 lb (85.3 kg)  12/21/20 188 lb (85.3 kg)    There are no preventive care reminders to display for this patient.  There are no preventive care reminders to display for this patient.   No results found for: TSH Lab Results  Component Value Date   WBC 8.5 12/21/2020   HGB 13.8 12/21/2020   HCT 43.1 12/21/2020   MCV 84 12/21/2020   PLT 368 12/21/2020   Lab Results  Component Value Date   NA 139 12/21/2020   K 4.7 12/21/2020   CO2 22 12/21/2020   GLUCOSE 92 12/21/2020   BUN 12 12/21/2020   CREATININE 0.82 12/21/2020   BILITOT <0.2 12/21/2020   ALKPHOS 71 12/21/2020   AST 22 12/21/2020   ALT 27 12/21/2020   PROT 7.6 12/21/2020   ALBUMIN 4.3 12/21/2020   CALCIUM 9.8 12/21/2020   ANIONGAP 8 01/28/2020   EGFR 100 12/21/2020   Lab Results  Component Value Date   CHOL 185 12/21/2020   Lab Results  Component Value Date   HDL 40 12/21/2020   Lab Results  Component Value Date   LDLCALC 131 (H) 12/21/2020   Lab Results  Component Value Date   TRIG 74 12/21/2020   No results found for: CHOLHDL No results found for: HGBA1C     Assessment & Plan:   Problem List Items Addressed This Visit       Other   COVID-19    -positive home test x2 on 7/26 -Rx. Paxlovid, norel, and tessalon -states SOB was worse around Tuesday, but she is slowly improving       Relevant Medications   nirmatrelvir/ritonavir EUA (PAXLOVID) TABS     Meds ordered this encounter  Medications   benzonatate (TESSALON) 100 MG capsule    Sig: Take 1 capsule (100 mg total) by mouth 2 (two) times daily as needed for cough.    Dispense:  20 capsule    Refill:  0   Chlorphen-PE-Acetaminophen (NOREL AD) 4-10-325 MG TABS    Sig: Take 1 tablet by mouth every 4 (four) hours as needed (nasal congestion, cold symptoms).    Dispense:  20 tablet    Refill:  1   nirmatrelvir/ritonavir EUA (PAXLOVID)  TABS    Sig: Take 3 tablets by mouth 2 (two) times daily for 5 days. (Take nirmatrelvir 150 mg two tablets twice daily for 5 days and ritonavir 100 mg one tablet twice daily for 5 days) Patient  GFR is 100    Dispense:  30 tablet    Refill:  0   Date:  04/23/2021   Location of Patient: Home Location of Provider: Office Consent was obtain for visit to be over via telehealth. I verified that I am speaking with the correct person using two identifiers.  I connected with  Drue Dun on 04/23/21 via telephone and verified that I am speaking with the correct person using two identifiers.   I discussed the limitations of evaluation and management by telemedicine. The patient expressed understanding and agreed to proceed.  Time spent: 8 minutes   Noreene Larsson, NP

## 2021-04-23 NOTE — Assessment & Plan Note (Signed)
-  positive home test x2 on 7/26 -Rx. Paxlovid, norel, and tessalon -states SOB was worse around Tuesday, but she is slowly improving

## 2021-04-23 NOTE — Patient Instructions (Signed)
Go to emergency department if severe shortness of breath develops.

## 2021-05-12 ENCOUNTER — Other Ambulatory Visit: Payer: Self-pay

## 2021-05-12 ENCOUNTER — Encounter: Payer: Self-pay | Admitting: Nurse Practitioner

## 2021-05-12 ENCOUNTER — Ambulatory Visit (INDEPENDENT_AMBULATORY_CARE_PROVIDER_SITE_OTHER): Payer: Medicaid Other | Admitting: Nurse Practitioner

## 2021-05-12 VITALS — BP 148/103 | HR 88 | Temp 98.4°F | Ht 63.5 in | Wt 188.0 lb

## 2021-05-12 DIAGNOSIS — N898 Other specified noninflammatory disorders of vagina: Secondary | ICD-10-CM

## 2021-05-12 DIAGNOSIS — I10 Essential (primary) hypertension: Secondary | ICD-10-CM

## 2021-05-12 MED ORDER — FLUCONAZOLE 150 MG PO TABS: ORAL_TABLET | ORAL | 0 refills | Status: DC

## 2021-05-12 MED ORDER — LOSARTAN POTASSIUM-HCTZ 50-12.5 MG PO TABS: 1.0000 | ORAL_TABLET | Freq: Every day | ORAL | 1 refills | Status: DC

## 2021-05-12 NOTE — Patient Instructions (Signed)
Please have NuSwab completed today.

## 2021-05-12 NOTE — Assessment & Plan Note (Addendum)
BP Readings from Last 3 Encounters:  05/12/21 (!) 148/103  03/23/21 (!) 162/114  12/21/20 (!) 151/100   -at her last visit, she hadn't had her BP meds in a month -today, she has been taking her amlodipine -Rx. Losartan-HCTZ -she has home BP cuff, and she will keep logs for her next visit -we discussed taking amlodipine in evening and losartan-HCTZ when she wakes up

## 2021-05-12 NOTE — Progress Notes (Signed)
Established Patient Office Visit  Subjective:  Patient ID: Bethany Gomez, female    DOB: Jul 24, 1991  Age: 30 y.o. MRN: [id number]  CC:  Chief Complaint  Patient presents with   Hypertension    Follow up    HPI [name] presents for BP check. At her last visit, we refilled her BP meds that she had been out of for a month. Today, she reports that she   Past Medical History:  Diagnosis Date   Headaches, cluster    History of gestational hypertension 02/09/2017   History of prior pregnancy with IUGR newborn 03/20/2017   3lb14oz @ 37wks      Hypertension    Mono exposure    Premenstrual dysphoric disorder    UTI (lower urinary tract infection)     Past Surgical History:  Procedure Laterality Date   NO PAST SURGERIES      Family History  Problem Relation Age of Onset   Diabetes Mother    Fibromyalgia Mother    Hypertension Mother    Congestive Heart Failure Father    Narcolepsy Father    Aneurysm Father    Congestive Heart Failure Paternal Uncle    Diabetes Maternal Grandmother    Congestive Heart Failure Maternal Grandmother    Diabetes Maternal Grandfather    Congestive Heart Failure Maternal Grandfather    Diabetes Paternal Grandmother    Diabetes Paternal Grandfather    Congestive Heart Failure Paternal Grandfather     Social History   Socioeconomic History   Marital status: Single    Spouse name: Not on file   Number of children: Not on file   Years of education: Not on file   Highest education level: Not on file  Occupational History   Occupation: Mail order pharmacist    Comment: [employer]  Tobacco Use   Smoking status: Every Day    Packs/day: 1.00    Years: 3.00    Pack years: 3.00    Types: Cigarettes   Smokeless tobacco: Never  Vaping Use   Vaping Use: Never used  Substance and Sexual Activity   Alcohol use: Yes    Comment: rarely in social setting; maybe once per month   Drug use: Yes    Types: Marijuana    Comment: at least once per  day   Sexual activity: Yes    Birth control/protection: Implant    Comment: nexplanon- [facility]  Other Topics Concern   Not on file  Social History Narrative   Not on file   Social Determinants of Health   Financial Resource Strain: Not on file  Food Insecurity: Not on file  Transportation Needs: Not on file  Physical Activity: Not on file  Stress: Not on file  Social Connections: Not on file  Intimate Partner Violence: Not on file    Outpatient Medications Prior to Visit  Medication Sig Dispense Refill   amLODipine (NORVASC) 10 MG tablet Take 1 tablet (10 mg total) by mouth daily. 90 tablet 1   cetirizine (ZYRTEC) 10 MG tablet Take 1 tablet (10 mg total) by mouth daily. 30 tablet 11   benzonatate (TESSALON) 100 MG capsule Take 1 capsule (100 mg total) by mouth 2 (two) times daily as needed for cough. (Patient not taking: Reported on 05/12/2021) 20 capsule 0   Chlorphen-PE-Acetaminophen (NOREL AD) 4-10-325 MG TABS Take 1 tablet by mouth every 4 (four) hours as needed (nasal congestion, cold symptoms). (Patient not taking: Reported on 05/12/2021) 20 tablet  1   No facility-administered medications prior to visit.    Allergies  Allergen Reactions   Latex Hives, Itching and Other (See Comments)    Reaction:  Burning    ROS Review of Systems    Objective:    Physical Exam  BP (!) 148/103 (BP Location: Right Arm, Patient Position: Sitting, Cuff Size: Large)   Pulse 88   Temp 98.4 F (36.9 C) (Oral)   Ht 5' 3.5" (1.613 m)   Wt 188 lb (85.3 kg)   SpO2 98%   BMI 32.78 kg/m  Wt Readings from Last 3 Encounters:  05/12/21 188 lb (85.3 kg)  04/23/21 170 lb (77.1 kg)  03/23/21 188 lb (85.3 kg)     Health Maintenance Due  Topic Date Due   INFLUENZA VACCINE  04/26/2021    There are no preventive care reminders to display for this patient.  No results found for: TSH Lab Results  Component Value Date   WBC 8.5 12/21/2020   HGB 13.8 12/21/2020   HCT  43.1 12/21/2020   MCV 84 12/21/2020   PLT 368 12/21/2020   Lab Results  Component Value Date   NA 139 12/21/2020   K 4.7 12/21/2020   CO2 22 12/21/2020   GLUCOSE 92 12/21/2020   BUN 12 12/21/2020   CREATININE 0.82 12/21/2020   BILITOT <0.2 12/21/2020   ALKPHOS 71 12/21/2020   AST 22 12/21/2020   ALT 27 12/21/2020   PROT 7.6 12/21/2020   ALBUMIN 4.3 12/21/2020   CALCIUM 9.8 12/21/2020   ANIONGAP 8 01/28/2020   EGFR 100 12/21/2020   Lab Results  Component Value Date   CHOL 185 12/21/2020   Lab Results  Component Value Date   HDL 40 12/21/2020   Lab Results  Component Value Date   LDLCALC 131 (H) 12/21/2020   Lab Results  Component Value Date   TRIG 74 12/21/2020   No results found for: CHOLHDL No results found for: HGBA1C    Assessment & Plan:   Problem List Items Addressed This Visit       Cardiovascular and Mediastinum   High blood pressure - Primary    BP Readings from Last 3 Encounters:  05/12/21 (!) 148/103  03/23/21 (!) 162/114  12/21/20 (!) 151/100  -at her last visit, she hadn't had her BP meds in a month -today, she has been taking her amlodipine -Rx. Losartan-HCTZ -she has home BP cuff, and she will keep logs for her next visit -we discussed taking amlodipine in evening and losartan-HCTZ when she wakes up      Relevant Medications   losartan-hydrochlorothiazide (HYZAAR) 50-12.5 MG tablet     Musculoskeletal and Integument   Vaginal itching    -pt states she had itching for several days and feels like she has a yeast infection -nuswab ordered -Rx. diflucan      Relevant Medications   fluconazole (DIFLUCAN) 150 MG tablet   Other Relevant Orders   NuSwab Vaginitis Plus (VG+)    Meds ordered this encounter  Medications   fluconazole (DIFLUCAN) 150 MG tablet    Sig: Take 1 tablet PO now. Repeat dose in 72 hours.    Dispense:  2 tablet    Refill:  0   losartan-hydrochlorothiazide (HYZAAR) 50-12.5 MG tablet    Sig: Take 1 tablet by  mouth daily.    Dispense:  90 tablet    Refill:  1    Follow-up: Return in about 1 month (around 06/12/2021) for BP Check.  Noreene Larsson, NP

## 2021-05-12 NOTE — Assessment & Plan Note (Signed)
-  pt states she had itching for several days and feels like she has a yeast infection -nuswab ordered -Rx. diflucan

## 2021-05-17 LAB — NUSWAB VAGINITIS PLUS (VG+)
Candida albicans, NAA: POSITIVE — AB
Candida glabrata, NAA: NEGATIVE
Chlamydia trachomatis, NAA: NEGATIVE
Neisseria gonorrhoeae, NAA: NEGATIVE
Trich vag by NAA: NEGATIVE

## 2021-05-17 NOTE — Progress Notes (Signed)
Nuswab showed a yeast infection, so diflucan should have helped out.

## 2021-05-18 ENCOUNTER — Telehealth: Payer: Self-pay

## 2021-05-18 NOTE — Telephone Encounter (Signed)
Patient returning lab result call 

## 2021-06-17 ENCOUNTER — Ambulatory Visit: Payer: Medicaid Other | Admitting: Nurse Practitioner

## 2021-08-16 ENCOUNTER — Ambulatory Visit: Payer: Medicaid Other | Admitting: Nurse Practitioner

## 2021-08-26 ENCOUNTER — Ambulatory Visit: Payer: Medicaid Other | Admitting: Nurse Practitioner

## 2021-09-02 ENCOUNTER — Ambulatory Visit: Payer: Medicaid Other | Admitting: Nurse Practitioner

## 2021-09-06 ENCOUNTER — Encounter: Payer: Self-pay | Admitting: Nurse Practitioner

## 2021-09-06 ENCOUNTER — Other Ambulatory Visit: Payer: Self-pay

## 2021-09-06 ENCOUNTER — Ambulatory Visit: Payer: Medicaid Other | Admitting: Nurse Practitioner

## 2021-09-06 DIAGNOSIS — I10 Essential (primary) hypertension: Secondary | ICD-10-CM

## 2021-09-06 MED ORDER — LOSARTAN POTASSIUM-HCTZ 50-12.5 MG PO TABS: 1.0000 | ORAL_TABLET | Freq: Every day | ORAL | 1 refills | Status: DC

## 2021-09-06 NOTE — Patient Instructions (Signed)
I will be moving to [facility] located at [address], [place], [place] [address] effective Sep 26, 2021. °If you would like to establish care with [facility] please call [phone]. °

## 2021-09-06 NOTE — Progress Notes (Signed)
Acute Office Visit  Subjective:    Patient ID: Bethany Gomez, female    DOB: 1991-05-30, 30 y.o.   MRN: [id number]  Chief Complaint  Patient presents with   Follow-up    Follow up possible yeast infection    HPI Patient is in today for BP check. At her last visit on 05/12/21, we swapped her from losartan to losartan-HCTZ. She states that she hasn't taken her BP medications today or regularly. She hasn't had a car for the last month, so she has had trouble getting to the pharmacy.  Past Medical History:  Diagnosis Date   Headaches, cluster    History of gestational hypertension 02/09/2017   History of prior pregnancy with IUGR newborn 03/20/2017   3lb14oz @ 37wks      Hypertension    Mono exposure    Premenstrual dysphoric disorder    UTI (lower urinary tract infection)     Past Surgical History:  Procedure Laterality Date   NO PAST SURGERIES      Family History  Problem Relation Age of Onset   Diabetes Mother    Fibromyalgia Mother    Hypertension Mother    Congestive Heart Failure Father    Narcolepsy Father    Aneurysm Father    Congestive Heart Failure Paternal Uncle    Diabetes Maternal Grandmother    Congestive Heart Failure Maternal Grandmother    Diabetes Maternal Grandfather    Congestive Heart Failure Maternal Grandfather    Diabetes Paternal Grandmother    Diabetes Paternal Grandfather    Congestive Heart Failure Paternal Grandfather     Social History   Socioeconomic History   Marital status: Single    Spouse name: Not on file   Number of children: Not on file   Years of education: Not on file   Highest education level: Not on file  Occupational History   Occupation: Mail order pharmacist    Comment: [employer]  Tobacco Use   Smoking status: Every Day    Packs/day: 1.00    Years: 3.00    Pack years: 3.00    Types: Cigarettes   Smokeless tobacco: Never  Vaping Use   Vaping Use: Never used  Substance and Sexual Activity   Alcohol use: Yes     Comment: rarely in social setting; maybe once per month   Drug use: Yes    Types: Marijuana    Comment: at least once per day   Sexual activity: Yes    Birth control/protection: Implant    Comment: nex[facility]on- [facility]  Other Topics Concern   Not on file  Social History Narrative   Not on file   Social Determinants of Health   Financial Resource Strain: Not on file  Food Insecurity: Not on file  Transportation Needs: Not on file  Physical Activity: Not on file  Stress: Not on file  Social Connections: Not on file  Intimate Partner Violence: Not on file    Outpatient Medications Prior to Visit  Medication Sig Dispense Refill   amLODipine (NORVASC) 10 MG tablet Take 1 tablet (10 mg total) by mouth daily. 90 tablet 1   cetirizine (ZYRTEC) 10 MG tablet Take 1 tablet (10 mg total) by mouth daily. 30 tablet 11   fluconazole (DIFLUCAN) 150 MG tablet Take 1 tablet PO now. Repeat dose in 72 hours. 2 tablet 0   losartan-hydrochlorothiazide (HYZAAR) 50-12.5 MG tablet Take 1 tablet by mouth daily. 90 tablet 1   No facility-administered medications prior  to visit.    Allergies  Allergen Reactions   Latex Hives, Itching and Other (See Comments)    Reaction:  Burning    Review of Systems  Constitutional: Negative.   Respiratory: Negative.    Cardiovascular: Negative.   Musculoskeletal: Negative.   Psychiatric/Behavioral: Negative.        Objective:    Physical Exam Constitutional:      Appearance: Normal appearance.  HENT:     Right Ear: Tympanic membrane, ear canal and external ear normal.     Left Ear: Tympanic membrane, ear canal and external ear normal.  Cardiovascular:     Rate and Rhythm: Normal rate and regular rhythm.     Pulses: Normal pulses.     Heart sounds: Normal heart sounds.  Pulmonary:     Effort: Pulmonary effort is normal.     Breath sounds: Normal breath sounds.  Musculoskeletal:        General: Normal range of motion.   Neurological:     Mental Status: She is alert.  Psychiatric:        Mood and Affect: Mood normal.        Behavior: Behavior normal.        Thought Content: Thought content normal.        Judgment: Judgment normal.    BP (!) 163/111   Pulse 94   Ht 5' 3"  (1.6 m)   Wt 188 lb 0.6 oz (85.3 kg)   SpO2 97%   BMI 33.31 kg/m  Wt Readings from Last 3 Encounters:  09/06/21 188 lb 0.6 oz (85.3 kg)  05/12/21 188 lb (85.3 kg)  04/23/21 170 lb (77.1 kg)    There are no preventive care reminders to display for this patient.   There are no preventive care reminders to display for this patient.   No results found for: TSH Lab Results  Component Value Date   WBC 8.5 12/21/2020   HGB 13.8 12/21/2020   HCT 43.1 12/21/2020   MCV 84 12/21/2020   PLT 368 12/21/2020   Lab Results  Component Value Date   NA 139 12/21/2020   K 4.7 12/21/2020   CO2 22 12/21/2020   GLUCOSE 92 12/21/2020   BUN 12 12/21/2020   CREATININE 0.82 12/21/2020   BILITOT <0.2 12/21/2020   ALKPHOS 71 12/21/2020   AST 22 12/21/2020   ALT 27 12/21/2020   PROT 7.6 12/21/2020   ALBUMIN 4.3 12/21/2020   CALCIUM 9.8 12/21/2020   ANIONGAP 8 01/28/2020   EGFR 100 12/21/2020   Lab Results  Component Value Date   CHOL 185 12/21/2020   Lab Results  Component Value Date   HDL 40 12/21/2020   Lab Results  Component Value Date   LDLCALC 131 (H) 12/21/2020   Lab Results  Component Value Date   TRIG 74 12/21/2020   No results found for: CHOLHDL No results found for: HGBA1C     Assessment & Plan:   Problem List Items Addressed This Visit       Cardiovascular and Mediastinum   High blood pressure    BP Readings from Last 3 Encounters:  09/06/21 (!) 163/111  05/12/21 (!) 148/103  03/23/21 (!) 162/114  -refilled hyzaar -pt admits to poor compliance -we will get her meds delivered to her home since she has had transportation issues      Relevant Medications   losartan-hydrochlorothiazide (HYZAAR)  50-12.5 MG tablet     Meds ordered this encounter  Medications   losartan-hydrochlorothiazide (  HYZAAR) 50-12.5 MG tablet    Sig: Take 1 tablet by mouth daily.    Dispense:  90 tablet    Refill:  1    Pt would like her medication delivered to her home      Noreene Larsson, NP

## 2021-09-06 NOTE — Assessment & Plan Note (Addendum)
BP Readings from Last 3 Encounters:  09/06/21 (!) 163/111  05/12/21 (!) 148/103  03/23/21 (!) 162/114   -refilled hyzaar -pt admits to poor compliance -we will get her meds delivered to her home since she has had transportation issues

## 2021-12-06 ENCOUNTER — Ambulatory Visit: Payer: Medicaid Other | Admitting: Nurse Practitioner

## 2021-12-08 ENCOUNTER — Encounter: Payer: Self-pay | Admitting: Nurse Practitioner

## 2021-12-08 ENCOUNTER — Other Ambulatory Visit: Payer: Self-pay

## 2021-12-08 ENCOUNTER — Ambulatory Visit: Payer: Medicaid Other | Admitting: Nurse Practitioner

## 2021-12-08 VITALS — BP 172/104 | HR 93 | Ht 63.0 in | Wt 187.0 lb

## 2021-12-08 DIAGNOSIS — R058 Other specified cough: Secondary | ICD-10-CM | POA: Insufficient documentation

## 2021-12-08 DIAGNOSIS — I10 Essential (primary) hypertension: Secondary | ICD-10-CM | POA: Diagnosis not present

## 2021-12-08 DIAGNOSIS — E669 Obesity, unspecified: Secondary | ICD-10-CM | POA: Diagnosis not present

## 2021-12-08 DIAGNOSIS — F17200 Nicotine dependence, unspecified, uncomplicated: Secondary | ICD-10-CM

## 2021-12-08 MED ORDER — BENZONATATE 100 MG PO CAPS: 100.0000 mg | ORAL_CAPSULE | Freq: Two times a day (BID) | ORAL | 0 refills | Status: DC | PRN

## 2021-12-08 MED ORDER — CETIRIZINE HCL 10 MG PO TABS: 10.0000 mg | ORAL_TABLET | Freq: Every day | ORAL | 5 refills | Status: DC

## 2021-12-08 MED ORDER — AMLODIPINE BESYLATE 10 MG PO TABS: 10.0000 mg | ORAL_TABLET | Freq: Every day | ORAL | 1 refills | Status: DC

## 2021-12-08 MED ORDER — LOSARTAN POTASSIUM-HCTZ 50-12.5 MG PO TABS: 1.0000 | ORAL_TABLET | Freq: Every day | ORAL | 1 refills | Status: DC

## 2021-12-08 NOTE — Patient Instructions (Addendum)
Please take your blood pressure medications daily as ordered. ?Take tessalon 100mg  two times daily as needed for cough, take cetirizine 10mg  daily for your allergies.  ? ? ? ? ?It is important that you exercise regularly at least 30 minutes 5 times a week.  ?Think about what you will eat, plan ahead. ?Choose " clean, green, fresh or frozen" over canned, processed or packaged foods which are more sugary, salty and fatty. ?70 to 75% of food eaten should be vegetables and fruit. ?Three meals at set times with snacks allowed between meals, but they must be fruit or vegetables. ?Aim to eat over a 12 hour period , example 7 am to 7 pm, and STOP after  your last meal of the day. ?Drink water,generally about 64 ounces per day, no other drink is as healthy. Fruit juice is best enjoyed in a healthy way, by EATING the fruit. ? ?Thanks for choosing [facility], we consider it a privelige to serve you.  ?

## 2021-12-08 NOTE — Assessment & Plan Note (Signed)
smokes a pack of cigarettes every 2 days, she has been smoking for about 8 years, she quit a couple of times.,  Not ready to quit today ?Need to quit including risk of lung cancer, COPD, stroke discussed with patient patient verbalized understanding.  Patient states that she will continue to go back to smoking,  ?

## 2021-12-08 NOTE — Assessment & Plan Note (Signed)
BP Readings from Last 3 Encounters:  ?12/08/21 (!) 172/104  ?09/06/21 (!) 163/111  ?05/12/21 (!) 148/103  ?Chronic condition, uncontrolled ?Patient reports poor medication adherence.  This has been a reoccurring issue ?Need to take medications as prescribed to prevent risk of heart disease, stroke discussed with patient. ?DASH diet advised, engage in regular exercise 30 minutes 5 days a week. ?Amlodipine 10 mg daily, Hyzaar 50-12.5 mg daily refilled today. ?Patient told to monitor blood pressure at home keep a log and bring to next follow-up appointment in 4 weeks. ?CMP plus EGFR today. ?

## 2021-12-08 NOTE — Progress Notes (Signed)
? ?Bethany Gomez     MRN: [id number]      DOB: 03/01/1991 ? ? ?HPI ?Ms. Bethany Gomez with medical history of high blood pressure, depression, obesity, seasonal allergies is here for follow up and re-evaluation of her chronic medical conditions, medication management  ? ?Pt c/o non productive cough that's started  2 weeks ago ,  ,she has taken benadryl and OTC cough medicine, which did not help.  she had home Covid test done, it was negative, pt denies fever , chills , sneezing, SOB wheezing. Pt stated that she smokes a pack of cigarettes every 2 days, she has been smoking for about 8 years, she quit a couple of times.  Not ready to quit today patient states that she will continue to work on cutting back smoking.  ? ?Patient stated that she has not been taking her blood pressure medications consistently, states that she sometimes forgets to take her medications, she has been going through a lot,  she has not been checking BP at home.  Patient denies dizziness chest pain leg edema.  ? ? ?ROS ?Denies recent fever or chills. ?Denies sinus pressure, nasal congestion, ear pain or sore throat. ?Denies SOB or wheezing. ?Denies chest pains, palpitations and leg swelling ?Denies abdominal pain, nausea, vomiting,diarrhea or constipation.   ?Denies dysuria, frequency, hesitancy or incontinence. ?Denies joint pain, swelling and limitation in mobility. ?Denies headaches, seizures, numbness, or tingling. ?Denies depression, anxiety or insomnia. ? ? ? ?PE ? ?BP (!) 172/104 (BP Location: Left Arm, Cuff Size: Large)   Pulse 93   Ht 5' 3" (1.6 m)   Wt 187 lb (84.8 kg)   SpO2 98%   BMI 33.13 kg/m?  ? ?Patient alert and oriented and in no cardiopulmonary distress. ? ?Chest: Clear to auscultation bilaterally. ? ?CVS: S1, S2 no murmurs, no S3.Regular rate. ? ?ABD: Soft non tender.  ? ?Ext: No edema ? ?MS: Adequate ROM spine, shoulders, hips and knees. ? ?Psych: Good eye contact, normal affect. Memory intact not anxious or depressed  appearing. ? ?CNS: CN 2-12 intact, power,  normal throughout.no focal deficits noted. ? ? ?Assessment & Plan ? ?Obesity (BMI 35.0-39.9 without comorbidity) ?She has been doing a lot of exercise but she has not been able to loose weight, she has been eating a lot of vegetables.  ?Wt Readings from Last 3 Encounters:  ?12/08/21 187 lb (84.8 kg)  ?09/06/21 188 lb 0.6 oz (85.3 kg)  ?05/12/21 188 lb (85.3 kg)  ?Need to increase intake of whole foods consisting mainly vegetables and protein less carbs, drink at least 64 ounces of water daily, engage in regular vigorous exercise 30 minutes to 1 hour, 5 days a week, importance of portion control discussed with patient she verbalized understanding ? ?High blood pressure ?BP Readings from Last 3 Encounters:  ?12/08/21 (!) 172/104  ?09/06/21 (!) 163/111  ?05/12/21 (!) 148/103  ?Chronic condition, uncontrolled ?Patient reports poor medication adherence.  This has been a reoccurring issue ?Need to take medications as prescribed to prevent risk of heart disease, stroke discussed with patient. ?DASH diet advised, engage in regular exercise 30 minutes 5 days a week. ?Amlodipine 10 mg daily, Hyzaar 50-12.5 mg daily refilled today. ?Patient told to monitor blood pressure at home keep a log and bring to next follow-up appointment in 4 weeks. ?CMP plus EGFR today. ? ?Allergic cough ?Rx Tessalon 100 mg twice daily as needed for cough ?Takes cetirizine 10 mg daily ?Need to quit smoking, and avoiding allergens discussed  with patient she verbalized understanding.  ? ?Current smoker ?smokes a pack of cigarettes every 2 days, she has been smoking for about 8 years, she quit a couple of times.,  Not ready to quit today ?Need to quit including risk of lung cancer, COPD, stroke discussed with patient patient verbalized understanding.  Patient states that she will continue to go back to smoking,   ?

## 2021-12-08 NOTE — Assessment & Plan Note (Signed)
Rx Tessalon 100 mg twice daily as needed for cough ?Takes cetirizine 10 mg daily ?Need to quit smoking, and avoiding allergens discussed with patient she verbalized understanding.  ?

## 2021-12-08 NOTE — Assessment & Plan Note (Addendum)
She has been doing a lot of exercise but she has not been able to loose weight, she has been eating a lot of vegetables.  ?Wt Readings from Last 3 Encounters:  ?12/08/21 187 lb (84.8 kg)  ?09/06/21 188 lb 0.6 oz (85.3 kg)  ?05/12/21 188 lb (85.3 kg)  ?Need to increase intake of whole foods consisting mainly vegetables and protein less carbs, drink at least 64 ounces of water daily, engage in regular vigorous exercise 30 minutes to 1 hour, 5 days a week, importance of portion control discussed with patient she verbalized understanding ?

## 2021-12-09 LAB — CMP14+EGFR
ALT: 19 IU/L (ref 0–32)
AST: 16 IU/L (ref 0–40)
Albumin/Globulin Ratio: 1.6 (ref 1.2–2.2)
Albumin: 4.3 g/dL (ref 3.9–5.0)
Alkaline Phosphatase: 73 IU/L (ref 44–121)
BUN/Creatinine Ratio: 13 (ref 9–23)
BUN: 9 mg/dL (ref 6–20)
Bilirubin Total: <0.2 mg/dL (ref 0.0–1.2)
CO2: 18 mmol/L — ABNORMAL LOW (ref 20–29)
Calcium: 9.2 mg/dL (ref 8.7–10.2)
Chloride: 109 mmol/L — ABNORMAL HIGH (ref 96–106)
Creatinine, Ser: 0.71 mg/dL (ref 0.57–1.00)
Globulin, Total: 2.7 g/dL (ref 1.5–4.5)
Glucose: 91 mg/dL (ref 70–99)
Potassium: 4.3 mmol/L (ref 3.5–5.2)
Sodium: 141 mmol/L (ref 134–144)
Total Protein: 7 g/dL (ref 6.0–8.5)
eGFR: 118 mL/min/{1.73_m2} (ref >59)

## 2022-01-13 ENCOUNTER — Telehealth: Payer: Self-pay

## 2022-01-13 NOTE — Telephone Encounter (Signed)
Please advise 

## 2022-01-13 NOTE — Telephone Encounter (Signed)
Patient called asking if her birth control in  her arm can it be removed here in office? Patient call back [phone] ?

## 2022-01-14 NOTE — Telephone Encounter (Signed)
Called pt advised that she will need to go to her obgyn pt verbalized understanding ?

## 2022-02-01 ENCOUNTER — Ambulatory Visit (INDEPENDENT_AMBULATORY_CARE_PROVIDER_SITE_OTHER): Payer: Medicaid Other | Admitting: Adult Health

## 2022-02-01 ENCOUNTER — Encounter: Payer: Self-pay | Admitting: Adult Health

## 2022-02-01 VITALS — BP 147/97 | HR 84 | Ht 62.0 in | Wt 187.0 lb

## 2022-02-01 DIAGNOSIS — Z113 Encounter for screening for infections with a predominantly sexual mode of transmission: Secondary | ICD-10-CM

## 2022-02-01 DIAGNOSIS — Z3046 Encounter for surveillance of implantable subdermal contraceptive: Secondary | ICD-10-CM | POA: Diagnosis not present

## 2022-02-01 DIAGNOSIS — Z3202 Encounter for pregnancy test, result negative: Secondary | ICD-10-CM | POA: Diagnosis not present

## 2022-02-01 DIAGNOSIS — Z30011 Encounter for initial prescription of contraceptive pills: Secondary | ICD-10-CM | POA: Insufficient documentation

## 2022-02-01 LAB — POCT URINE PREGNANCY: Preg Test, Ur: NEGATIVE

## 2022-02-01 MED ORDER — NORETHINDRONE 0.35 MG PO TABS: 1.0000 | ORAL_TABLET | Freq: Every day | ORAL | 11 refills | Status: DC

## 2022-02-01 NOTE — Patient Instructions (Addendum)
Use condoms, keep clean and dry x 24 hours, no heavy lifting, keep steri strips on x 72 hours, Keep pressure dressing on x 24 hours. Follow up prn problems.  ?Start OCs today ?Follow up in 3 months  ?

## 2022-02-01 NOTE — Progress Notes (Signed)
?  Subjective:  ?  ? Patient ID: Bethany Gomez, female   DOB: [birth year], 31 y.o.   MRN: [id number] ? ?HPI ?Bethany Gomez is a 31 year old black female,single,G1P1 in requesting nexplanon removal and wants OCs. She is trying to lose weight and has lost 12 lbs she says. ?PCP is F. Paseda NP ? ? ?Review of Systems ?For nexplanon removal ?Denies any MI,stroke, DVT, or breast cancer ? ?Reviewed past medical,surgical, social and family history. Reviewed medications and allergies.  ?   ?Objective:  ? Physical Exam ?BP (!) 147/97 (BP Location: Right Arm, Patient Position: Sitting, Cuff Size: Normal)   Pulse 84   Ht 5\' 2"  (1.575 m)   Wt 187 lb (84.8 kg)   BMI 34.20 kg/m?  UPT is negative. Consent signed and time out called. ?Left arm cleansed with betadine, and injected with 1.5 cc 2% lidocaine and waited til numb.Under sterile technique a #11 blade was used to make small vertical incision, and a curved forceps was used to easily remove rod. Steri strips applied. Pressure dressing applied.  ?   ? [facility] - 02/01/22 1510   ? ?  ? Pregnancy Intention Screening  ? Does the patient want to become pregnant in the next year? No   ? Does the patient's partner want to become pregnant in the next year? No   ? Would the patient like to discuss contraceptive options today? Yes   ?  ? Contraception Wrap Up  ? Current Method Hormonal Implant   ? End Method Oral Contraceptive   ? Contraception Counseling Provided Yes   ? ?  ?  ? ?  ?  ?Assessment:  ?   ?1. Pregnancy test negative ? ?2. Screen for STD (sexually transmitted disease) ?Urine sent for GC.CHL ? ?3. Encounter for Nexplanon removal ? ? ?4. Encounter for initial prescription of contraceptive pills ?Will rx Micronor, she smokes and has elevated BP, on meds ?Start today and use condoms ?Meds ordered this encounter  ?Medications  ? norethindrone (MICRONOR) 0.35 MG tablet  ?  Sig: Take 1 tablet (0.35 mg total) by mouth daily.  ?  Dispense:  28 tablet  ?  Refill:  11  ?  Order Specific  Question:   Supervising Provider  ?  Answer:   04/03/22 H [[id number]]  ? Follow up in 3 months  ?   ?Plan:  ?   ?Use condoms, keep clean and dry x 24 hours, no heavy lifting, keep steri strips on x 72 hours, Keep pressure dressing on x 24 hours. Follow up prn problems.  ?  Request copy of pap from health dept  ?

## 2022-02-03 LAB — GC/CHLAMYDIA PROBE AMP
Chlamydia trachomatis, NAA: NEGATIVE
Neisseria Gonorrhoeae by PCR: NEGATIVE

## 2022-02-15 ENCOUNTER — Encounter: Payer: Self-pay | Admitting: Nurse Practitioner

## 2022-02-15 ENCOUNTER — Ambulatory Visit (INDEPENDENT_AMBULATORY_CARE_PROVIDER_SITE_OTHER): Payer: Medicaid Other | Admitting: Nurse Practitioner

## 2022-02-15 VITALS — BP 130/89 | HR 91 | Ht 63.0 in | Wt 188.0 lb

## 2022-02-15 DIAGNOSIS — I10 Essential (primary) hypertension: Secondary | ICD-10-CM

## 2022-02-15 DIAGNOSIS — E669 Obesity, unspecified: Secondary | ICD-10-CM

## 2022-02-15 DIAGNOSIS — Z0001 Encounter for general adult medical examination with abnormal findings: Secondary | ICD-10-CM | POA: Diagnosis not present

## 2022-02-15 DIAGNOSIS — F17200 Nicotine dependence, unspecified, uncomplicated: Secondary | ICD-10-CM

## 2022-02-15 DIAGNOSIS — Z0000 Encounter for general adult medical examination without abnormal findings: Secondary | ICD-10-CM | POA: Insufficient documentation

## 2022-02-15 MED ORDER — NICOTINE POLACRILEX 2 MG MT GUM: 2.0000 mg | CHEWING_GUM | OROMUCOSAL | 6 refills | Status: DC | PRN

## 2022-02-15 MED ORDER — NICOTINE 7 MG/24HR TD PT24: 7.0000 mg | MEDICATED_PATCH | Freq: Every day | TRANSDERMAL | 0 refills | Status: DC

## 2022-02-15 MED ORDER — NICOTINE 14 MG/24HR TD PT24: 14.0000 mg | MEDICATED_PATCH | Freq: Every day | TRANSDERMAL | 0 refills | Status: AC

## 2022-02-15 NOTE — Progress Notes (Signed)
Complete physical exam  Patient: Bethany Gomez   DOB: [birth year]   30 y.o. Female  MRN: [id number]  Subjective:    Chief Complaint  Patient presents with   Annual Exam    cpe    Bethany Gomez is a 31 y.o. female with past medical history of hypertension, depression, obesity, current smoker who presents today for a complete physical exam. She reports consuming a low fat and low sodium diet. She does walks twice a week for 30 minutes each time, does some exercises at home using an exercise machine. She generally feels well. She reports sleeping well. She does not have additional problems to discuss today.     Most recent fall risk assessment:    02/15/2022   11:17 AM  Fall Risk   Falls in the past year? 0  Number falls in past yr: 0  Injury with Fall? 0  Risk for fall due to : No Fall Risks  Follow up Falls evaluation completed     Most recent depression screenings:    02/15/2022   11:18 AM 12/08/2021    9:46 AM  PHQ 2/9 Scores  PHQ - 2 Score 0 0        Patient Care Team: Donell BeersPaseda, Torrance Stockley R, FNP as PCP - General (Nurse Practitioner)   Outpatient Medications Prior to Visit  Medication Sig   amLODipine (NORVASC) 10 MG tablet Take 1 tablet (10 mg total) by mouth daily.   cetirizine (ZYRTEC) 10 MG tablet Take 1 tablet (10 mg total) by mouth daily.   losartan-hydrochlorothiazide (HYZAAR) 50-12.5 MG tablet Take 1 tablet by mouth daily.   vitamin B-12 (CYANOCOBALAMIN) 500 MCG tablet Take 500 mcg by mouth daily.   benzonatate (TESSALON) 100 MG capsule Take 1 capsule (100 mg total) by mouth 2 (two) times daily as needed for cough. (Patient not taking: Reported on 02/01/2022)   norethindrone (MICRONOR) 0.35 MG tablet Take 1 tablet (0.35 mg total) by mouth daily. (Patient not taking: Reported on 02/15/2022)   No facility-administered medications prior to visit.    Review of Systems  Constitutional: Negative.  Negative for chills and fever.  HENT: Negative.  Negative for ear  pain, hearing loss and tinnitus.   Eyes: Negative.  Negative for blurred vision and double vision.  Respiratory: Negative.    Cardiovascular: Negative.   Gastrointestinal: Negative.  Negative for heartburn.  Genitourinary: Negative.  Negative for dysuria and urgency.  Musculoskeletal: Negative.  Negative for myalgias and neck pain.  Skin: Negative.  Negative for itching and rash.  Neurological: Negative.  Negative for dizziness, tingling and headaches.  Endo/Heme/Allergies: Negative.  Does not bruise/bleed easily.  Psychiatric/Behavioral: Negative.  Negative for depression and suicidal ideas.          Objective:     BP 130/89 (BP Location: Right Arm, Patient Position: Sitting, Cuff Size: Large)   Pulse 91   Ht 5\' 3"  (1.6 m)   Wt 188 lb (85.3 kg)   LMP 02/04/2022 (Approximate)   SpO2 100%   BMI 33.30 kg/m   Joliza CMA present as chaperone  Physical Exam Constitutional:      General: She is not in acute distress.    Appearance: She is obese. She is not ill-appearing, toxic-appearing or diaphoretic.  HENT:     Head: Normocephalic and atraumatic.     Right Ear: Tympanic membrane, ear canal and external ear normal. There is no impacted cerumen.     Left Ear: Tympanic membrane, ear canal and  external ear normal. There is no impacted cerumen.     Nose: Nose normal. No congestion or rhinorrhea.     Mouth/Throat:     Mouth: Mucous membranes are moist.     Pharynx: Oropharynx is clear. No oropharyngeal exudate or posterior oropharyngeal erythema.  Eyes:     General: No scleral icterus.       Right eye: No discharge.        Left eye: No discharge.     Extraocular Movements: Extraocular movements intact.     Conjunctiva/sclera: Conjunctivae normal.     Pupils: Pupils are equal, round, and reactive to light.  Neck:     Vascular: No carotid bruit.  Cardiovascular:     Rate and Rhythm: Normal rate and regular rhythm.     Pulses: Normal pulses.     Heart sounds: Normal heart  sounds. No murmur heard.   No friction rub. No gallop.  Pulmonary:     Effort: Pulmonary effort is normal. No respiratory distress.     Breath sounds: Normal breath sounds. No stridor. No wheezing, rhonchi or rales.  Chest:     Chest wall: No mass, lacerations, deformity, swelling, tenderness or crepitus.  Breasts:    Tanner Score is 5.     Right: Normal. No swelling, bleeding, inverted nipple, mass, nipple discharge, skin change or tenderness.     Left: Normal. No swelling, bleeding, inverted nipple, mass, nipple discharge, skin change or tenderness.  Abdominal:     General: There is no distension.     Palpations: Abdomen is soft. There is no mass.     Tenderness: There is no abdominal tenderness. There is no right CVA tenderness, left CVA tenderness, guarding or rebound.     Hernia: No hernia is present.  Musculoskeletal:        General: No swelling, tenderness, deformity or signs of injury.     Cervical back: Normal range of motion and neck supple. No rigidity or tenderness.     Right lower leg: No edema.     Left lower leg: No edema.  Lymphadenopathy:     Cervical: No cervical adenopathy.     Upper Body:     Right upper body: No supraclavicular, axillary or pectoral adenopathy.     Left upper body: No supraclavicular, axillary or pectoral adenopathy.  Skin:    General: Skin is warm and dry.     Capillary Refill: Capillary refill takes less than 2 seconds.     Coloration: Skin is not jaundiced or pale.     Findings: No bruising, erythema, lesion or rash.  Neurological:     Mental Status: She is alert and oriented to person, place, and time.     Cranial Nerves: No cranial nerve deficit.     Sensory: No sensory deficit.     Motor: No weakness.     Coordination: Coordination normal.     Gait: Gait normal.     Deep Tendon Reflexes: Reflexes normal.  Psychiatric:        Mood and Affect: Mood normal.        Behavior: Behavior normal.        Thought Content: Thought content  normal.        Judgment: Judgment normal.     No results found for any visits on 02/15/22.     Assessment & Plan:    Routine Health Maintenance and Physical Exam  Immunization History  Administered Date(s) Administered   Influenza,inj,Quad PF,6+ Mos 08/09/2016  Tdap 02/11/2017    Health Maintenance  Topic Date Due   INFLUENZA VACCINE  04/26/2022   PAP-Cervical Cytology Screening  01/25/2023   PAP SMEAR-Modifier  01/25/2023   TETANUS/TDAP  02/12/2027   Hepatitis C Screening  Completed   HIV Screening  Completed   HPV VACCINES  Aged Out   COVID-19 Vaccine  Discontinued    Discussed health benefits of physical activity, and encouraged her to engage in regular exercise appropriate for her age and condition.  Problem List Items Addressed This Visit       Cardiovascular and Mediastinum   High blood pressure    BP Readings from Last 3 Encounters:  02/15/22 130/89  02/01/22 (!) 147/97  12/08/21 (!) 172/104  Chronic condition well-controlled on amlodipine 10 mg daily, Hyzaar 50- 12.5 mg daily Continue current medications DASH diet advised engage in daily moderate exercises at least 150 minutes weekly.        Other   Obesity (BMI 35.0-39.9 without comorbidity)    Wt Readings from Last 3 Encounters:  02/15/22 188 lb (85.3 kg)  02/01/22 187 lb (84.8 kg)  12/08/21 187 lb (84.8 kg)  Chronic condition unchanged Does walking exercises 30 minutes twice weekly Need to increase intake of whole food consisting mainly of vegetables and protein less carbohydrate drink at least 64 ounces of water daily, engaging in moderate exercises 30 minutes 5 days a week, importance of pressure control also discussed with patient, patient verbalized understanding      Current smoker    Currently smokes one pack of cigarettes every 3 days.  She has been smoking for about 8 years, states that she quit 1 time for 2 years during her last pregnancy. Need  to quit smoking discussed with patient,  risk of COPD, lung cancer, heart disease discussed with patient.  Patient willing to start nicotine patch and Nicorette gum for smoking cessation Rx Nicorette 2 mg gum, nicotine patch  nicotine gum Week 1-6.  1 nicotine gum Every 1-2 hours, maximum 24 a day Week 7-9.  1 nicotine gum, every 2-4 hours, maximum 24 daily Weight 10-12.  1 nicotine gum every 48 hours, maximum 24 daily.  Nicotine patch Week 1-6.  Apply nicotine patch 14 mg daily for 6 weeks With 7-9.  Apply nicotine patch 7 mg daily for 2 weeks  Follow-up in 2 months.       Relevant Medications   nicotine (NICODERM CQ - DOSED IN MG/24 HOURS) 14 mg/24hr patch   nicotine polacrilex (NICORETTE) 2 MG gum   Annual physical exam - Primary    Annual exam as documented.  Counseling done include healthy lifestyle involving committing to 150 minutes of exercise per week, heart healthy diet, and attaining healthy weight. The importance of adequate sleep also discussed.  Regular use of seat belt and home safety were also discussed . Changes in health habits are decided on by patient with goals and time frames set for achieving them.        Relevant Orders   CBC with Differential   TSH   Lipid Profile   HgB A1c   Vitamin D (25 hydroxy)   Return in about 2 months (around 04/17/2022) for smoking cessation.     Donell Beers, FNP

## 2022-02-15 NOTE — Assessment & Plan Note (Addendum)
Wt Readings from Last 3 Encounters:  02/15/22 188 lb (85.3 kg)  02/01/22 187 lb (84.8 kg)  12/08/21 187 lb (84.8 kg)  Chronic condition unchanged Does walking exercises 30 minutes twice weekly Need to increase intake of whole food consisting mainly of vegetables and protein less carbohydrate drink at least 64 ounces of water daily, engaging in moderate exercises 30 minutes 5 days a week, importance of pressure control also discussed with patient, patient verbalized understanding

## 2022-02-15 NOTE — Assessment & Plan Note (Signed)
BP Readings from Last 3 Encounters:  02/15/22 130/89  02/01/22 (!) 147/97  12/08/21 (!) 172/104  Chronic condition well-controlled on amlodipine 10 mg daily, Hyzaar 50- 12.5 mg daily Continue current medications DASH diet advised engage in daily moderate exercises at least 150 minutes weekly.

## 2022-02-15 NOTE — Assessment & Plan Note (Addendum)
Currently smokes one pack of cigarettes every 3 days.  She has been smoking for about 8 years, states that she quit 1 time for 2 years during her last pregnancy. Need  to quit smoking discussed with patient, risk of COPD, lung cancer, heart disease discussed with patient.  Patient willing to start nicotine patch and Nicorette gum for smoking cessation Rx Nicorette 2 mg gum, nicotine patch  nicotine gum Week 1-6.  1 nicotine gum Every 1-2 hours, maximum 24 a day Week 7-9.  1 nicotine gum, every 2-4 hours, maximum 24 daily Weight 10-12.  1 nicotine gum every 48 hours, maximum 24 daily.  Nicotine patch Week 1-6.  Apply nicotine patch 14 mg daily for 6 weeks With 7-9.  Apply nicotine patch 7 mg daily for 2 weeks  Follow-up in 2 months.

## 2022-02-15 NOTE — Patient Instructions (Addendum)
nicotine gum Week 1-6.  1 nicotine gum Every 1-2 hours, maximum 24 a day Week 7-9.  1 nicotine gum, every 2-4 hours, maximum 24 daily Weight 10-12.  1 nicotine gum every 48 hours, maximum 24 daily.  Nicotine patch Week 1-6.  Apply nicotine patch 14 mg daily for 6 weeks With 7-9.  Apply nicotine patch 7 mg daily for 2 weeks     It is important that you exercise regularly at least 30 minutes 5 times a week.  Think about what you will eat, plan ahead. Choose " clean, green, fresh or frozen" over canned, processed or packaged foods which are more sugary, salty and fatty. 70 to 75% of food eaten should be vegetables and fruit. Three meals at set times with snacks allowed between meals, but they must be fruit or vegetables. Aim to eat over a 12 hour period , example 7 am to 7 pm, and STOP after  your last meal of the day. Drink water,generally about 64 ounces per day, no other drink is as healthy. Fruit juice is best enjoyed in a healthy way, by EATING the fruit.  Thanks for choosing [facility], we consider it a privelige to serve you.

## 2022-02-15 NOTE — Assessment & Plan Note (Addendum)
Annual exam as documented.  Counseling done include healthy lifestyle involving committing to 150 minutes of exercise per week, heart healthy diet, and attaining healthy weight. The importance of adequate sleep also discussed.  Regular use of seat belt and home safety were also discussed . Changes in health habits are decided on by patient with goals and time frames set for achieving them.  

## 2022-02-17 ENCOUNTER — Other Ambulatory Visit: Payer: Self-pay | Admitting: Nurse Practitioner

## 2022-02-17 DIAGNOSIS — E559 Vitamin D deficiency, unspecified: Secondary | ICD-10-CM

## 2022-02-17 LAB — CBC WITH DIFFERENTIAL/PLATELET
Basophils Absolute: 0 10*3/uL (ref 0.0–0.2)
Basos: 1 %
EOS (ABSOLUTE): 0.2 10*3/uL (ref 0.0–0.4)
Eos: 2 %
Hematocrit: 42.6 % (ref 34.0–46.6)
Hemoglobin: 13.6 g/dL (ref 11.1–15.9)
Immature Grans (Abs): 0 10*3/uL (ref 0.0–0.1)
Immature Granulocytes: 0 %
Lymphocytes Absolute: 2.2 10*3/uL (ref 0.7–3.1)
Lymphs: 31 %
MCH: 26.7 pg (ref 26.6–33.0)
MCHC: 31.9 g/dL (ref 31.5–35.7)
MCV: 84 fL (ref 79–97)
Monocytes Absolute: 0.5 10*3/uL (ref 0.1–0.9)
Monocytes: 8 %
Neutrophils Absolute: 4.2 10*3/uL (ref 1.4–7.0)
Neutrophils: 58 %
Platelets: 327 10*3/uL (ref 150–450)
RBC: 5.1 x10E6/uL (ref 3.77–5.28)
RDW: 13.1 % (ref 11.7–15.4)
WBC: 7.2 10*3/uL (ref 3.4–10.8)

## 2022-02-17 LAB — LIPID PANEL
Chol/HDL Ratio: 4.8 ratio — ABNORMAL HIGH (ref 0.0–4.4)
Cholesterol, Total: 176 mg/dL (ref 100–199)
HDL: 37 mg/dL — ABNORMAL LOW (ref >39)
LDL Chol Calc (NIH): 124 mg/dL — ABNORMAL HIGH (ref 0–99)
Triglycerides: 78 mg/dL (ref 0–149)
VLDL Cholesterol Cal: 15 mg/dL (ref 5–40)

## 2022-02-17 LAB — HEMOGLOBIN A1C
Est. average glucose Bld gHb Est-mCnc: 105 mg/dL
Hgb A1c MFr Bld: 5.3 % (ref 4.8–5.6)

## 2022-02-17 LAB — TSH: TSH: 1.55 u[IU]/mL (ref 0.450–4.500)

## 2022-02-17 LAB — VITAMIN D 25 HYDROXY (VIT D DEFICIENCY, FRACTURES): Vit D, 25-Hydroxy: 16.4 ng/mL — ABNORMAL LOW (ref 30.0–100.0)

## 2022-02-17 MED ORDER — VITAMIN D3 25 MCG (1000 UT) PO CAPS: 1000.0000 [IU] | ORAL_CAPSULE | Freq: Every day | ORAL | 1 refills | Status: DC

## 2022-02-17 NOTE — Progress Notes (Signed)
Vitamin d deff. Take vitamin D 1000 units daily , drink milk supplemented with vitamin d.  LDL is elevated, and HDL is low .  Eat a healthy diet, including lots of fruits and vegetables. Avoid foods with a lot of saturated and trans fats, such as red meat, butter, fried foods and cheese . Engage in daily moderate exercises at least 150 minutes weeks to attain  a healthy weight.

## 2022-02-23 ENCOUNTER — Telehealth: Payer: Self-pay

## 2022-02-23 NOTE — Telephone Encounter (Signed)
Patient called needs to know what weight loss medicine name of it so she can call her insurance company to see if they will cover the injection or not. Call back  # [phone]

## 2022-02-24 NOTE — Telephone Encounter (Signed)
Was this wegovy? Not seeing in notes, please advise

## 2022-02-24 NOTE — Telephone Encounter (Signed)
Called pt no answer left vm 

## 2022-03-02 NOTE — Telephone Encounter (Signed)
Medicaid does not cover any weight loss medication. The cheapest option would be phentermine as the cash price is @ $37.

## 2022-03-02 NOTE — Telephone Encounter (Signed)
Called pt no answer left vm 

## 2022-03-03 ENCOUNTER — Telehealth: Payer: Self-pay | Admitting: Nurse Practitioner

## 2022-03-03 NOTE — Telephone Encounter (Signed)
Called pt no answer left vm 

## 2022-03-03 NOTE — Telephone Encounter (Signed)
Pt called back. °

## 2022-03-10 ENCOUNTER — Telehealth: Payer: Self-pay | Admitting: Nurse Practitioner

## 2022-03-10 ENCOUNTER — Other Ambulatory Visit: Payer: Self-pay | Admitting: Nurse Practitioner

## 2022-03-10 DIAGNOSIS — E669 Obesity, unspecified: Secondary | ICD-10-CM

## 2022-03-10 MED ORDER — PHENTERMINE HCL 15 MG PO CAPS: 15.0000 mg | ORAL_CAPSULE | ORAL | 0 refills | Status: DC

## 2022-03-10 NOTE — Telephone Encounter (Signed)
Spoke with pt she states that she would like to try the phentermine please advise

## 2022-03-10 NOTE — Telephone Encounter (Signed)
Pt returning call, her phone is broke, can you please call her back at [phone]

## 2022-03-10 NOTE — Telephone Encounter (Signed)
Called pt to relay Fola's message no answer left vm 

## 2022-03-11 NOTE — Telephone Encounter (Signed)
Pt called back advised of Fola's message pt verbalized understanding

## 2022-04-13 ENCOUNTER — Telehealth: Payer: Self-pay | Admitting: Nurse Practitioner

## 2022-04-13 ENCOUNTER — Telehealth: Payer: Medicaid Other | Admitting: Nurse Practitioner

## 2022-04-13 NOTE — Telephone Encounter (Signed)
Patient aware.

## 2022-04-13 NOTE — Telephone Encounter (Signed)
Pt called stating she is getting an yeast infection. Wants to know if she can get something for this?   [facility] [place]     Pt states she is wanting to know if the weight loss medication can interfere with the BP medication??

## 2022-04-19 ENCOUNTER — Encounter: Payer: Self-pay | Admitting: Nurse Practitioner

## 2022-04-19 ENCOUNTER — Ambulatory Visit: Payer: Medicaid Other | Admitting: Nurse Practitioner

## 2022-04-19 VITALS — BP 150/100 | HR 97 | Ht 63.0 in | Wt 185.0 lb

## 2022-04-19 DIAGNOSIS — I10 Essential (primary) hypertension: Secondary | ICD-10-CM | POA: Diagnosis not present

## 2022-04-19 DIAGNOSIS — F17200 Nicotine dependence, unspecified, uncomplicated: Secondary | ICD-10-CM | POA: Diagnosis not present

## 2022-04-19 DIAGNOSIS — E66811 Obesity, class 1: Secondary | ICD-10-CM

## 2022-04-19 DIAGNOSIS — R21 Rash and other nonspecific skin eruption: Secondary | ICD-10-CM | POA: Diagnosis not present

## 2022-04-19 DIAGNOSIS — E669 Obesity, unspecified: Secondary | ICD-10-CM | POA: Diagnosis not present

## 2022-04-19 NOTE — Assessment & Plan Note (Addendum)
BP Readings from Last 3 Encounters:  04/19/22 (!) 150/100  02/15/22 130/89  02/01/22 (!) 147/97  Chronic condition uncontrolled Currently on amlodipine 10 mg daily, losartan-hydrochlorothiazide 50-12.5 mg daily Continue current dose of amlodipine, losartan-hydrochlorothiazide. Patient told to stop taking phentermine due to her uncontrolled hypertension DASH diet advised engage in regular moderate exercises at least 150 minutes weekly Follow-up in 4 weeks

## 2022-04-19 NOTE — Progress Notes (Signed)
   Bethany Gomez     MRN: [id number]      DOB: [birth year]   HPI Bethany Gomez with past medical history of hypertension, obesity, depression, current smoker is here for follow up for obesity and smoking cessation.  Obesity.  Currently on phentermine 50 mg daily patient stated that she has been experiencing headache recently thinks that her blood pressure may be running high.  She denies dizziness, syncope, edema.     Bumps on the left thigh that's comes and goes.  She stated that the bumps was initially sore but not currently.  Patient denies fever, chills, malaise.   ROS Denies recent fever or chills. Denies sinus pressure, nasal congestion, ear pain or sore throat. Denies chest congestion, productive cough or wheezing. Denies chest pains, palpitations and leg swelling Denies abdominal pain, nausea, vomiting,diarrhea or constipation.   Denies dysuria, frequency, hesitancy or incontinence. Denies joint pain, swelling and limitation in mobility. Denies headaches, seizures, numbness, or tingling. Denies depression, anxiety or insomnia.    PE  BP (!) 150/100   Pulse 97   Ht 5\' 3"  (1.6 m)   Wt 185 lb (83.9 kg)   LMP 03/08/2022 (Approximate)   SpO2 97%   BMI 32.77 kg/m   Patient alert and oriented and in no cardiopulmonary distress.  HEENT: No facial asymmetry, EOMI,     Neck supple .  Chest: Clear to auscultation bilaterally.  CVS: S1, S2 no murmurs, no S3.Regular rate.  ABD: Soft non tender.   Ext: No edema  MS: Adequate ROM spine, shoulders, hips and knees.  Skin: skin tear noted on posterior left thigh.  No purulent drainage, swelling, redness noted  Psych: Good eye contact, normal affect. Memory intact not anxious or depressed appearing.  CNS: CN 2-12 intact, power,  normal throughout.no focal deficits noted.   Assessment & Plan  Obesity (BMI 30.0-34.9) Wt Readings from Last 3 Encounters:  04/19/22 185 lb (83.9 kg)  02/15/22 188 lb (85.3 kg)  02/01/22 187 lb  (84.8 kg)  Phentermine discontinued today due to uncontrolled hypertension Patient counseled on low-carb diet encouraged to engage in regular moderate exercises at least 150 minutes weekly.  Current smoker Smokes about one pack/3 days She did not pick up the prescriptions for nicotine gum and nicotine patch  Asked about quitting: confirms that he/she currently smokes cigarettes Advise to quit smoking: Educated about QUITTING to reduce the risk of cancer, cardio and cerebrovascular disease. Assess willingness: Unwilling to quit at this time, but is working on cutting back. Assist with counseling and pharmacotherapy: Counseled for 5 minutes and literature provided. Arrange for follow up: follow up in 3-6 months and continue to offer help.  Patient encouraged to use nicotine gum and nicotine patch as ordered to assist with smoking cessation  High blood pressure BP Readings from Last 3 Encounters:  04/19/22 (!) 150/100  02/15/22 130/89  02/01/22 (!) 147/97  Chronic condition uncontrolled Currently on amlodipine 10 mg daily, losartan-hydrochlorothiazide 50-12.5 mg daily Continue current dose of amlodipine, losartan-hydrochlorothiazide. Patient told to stop taking phentermine due to her uncontrolled hypertension DASH diet advised engage in regular moderate exercises at least 150 minutes weekly Follow-up in 4 weeks  Rash Looks like a skin tear  No sign of infection noted Patient told to keep skin clean and dry and  open to air to promote healing

## 2022-04-19 NOTE — Assessment & Plan Note (Addendum)
Looks like a skin tear  No sign of infection noted Patient told to keep skin clean and dry and  open to air to promote healing

## 2022-04-19 NOTE — Patient Instructions (Addendum)
Please stop taking phentermine. Continue your blood pressure medication. Please monitor blood pressure at home, goal is less than 140/90.    Please keep the skin tear area clean and dry   It is important that you exercise regularly at least 30 minutes 5 times a week.  Think about what you will eat, plan ahead. Choose " clean, green, fresh or frozen" over canned, processed or packaged foods which are more sugary, salty and fatty. 70 to 75% of food eaten should be vegetables and fruit. Three meals at set times with snacks allowed between meals, but they must be fruit or vegetables. Aim to eat over a 12 hour period , example 7 am to 7 pm, and STOP after  your last meal of the day. Drink water,generally about 64 ounces per day, no other drink is as healthy. Fruit juice is best enjoyed in a healthy way, by EATING the fruit.  Thanks for choosing [facility], we consider it a privelige to serve you.

## 2022-04-19 NOTE — Assessment & Plan Note (Addendum)
Smokes about one pack/3 days She did not pick up the prescriptions for nicotine gum and nicotine patch  Asked about quitting: confirms that he/she currently smokes cigarettes Advise to quit smoking: Educated about QUITTING to reduce the risk of cancer, cardio and cerebrovascular disease. Assess willingness: Unwilling to quit at this time, but is working on cutting back. Assist with counseling and pharmacotherapy: Counseled for 5 minutes and literature provided. Arrange for follow up: follow up in 3-6 months and continue to offer help.  Patient encouraged to use nicotine gum and nicotine patch as ordered to assist with smoking cessation

## 2022-04-19 NOTE — Assessment & Plan Note (Addendum)
Wt Readings from Last 3 Encounters:  04/19/22 185 lb (83.9 kg)  02/15/22 188 lb (85.3 kg)  02/01/22 187 lb (84.8 kg)  Phentermine discontinued today due to uncontrolled hypertension Patient counseled on low-carb diet encouraged to engage in regular moderate exercises at least 150 minutes weekly.

## 2022-05-09 ENCOUNTER — Ambulatory Visit: Payer: Medicaid Other | Admitting: Adult Health

## 2022-05-13 ENCOUNTER — Ambulatory Visit: Payer: Medicaid Other | Admitting: Nurse Practitioner

## 2022-05-16 ENCOUNTER — Ambulatory Visit: Payer: Medicaid Other | Admitting: Adult Health

## 2022-05-17 ENCOUNTER — Telehealth: Payer: Self-pay

## 2022-05-17 ENCOUNTER — Encounter: Payer: Self-pay | Admitting: Family Medicine

## 2022-05-17 ENCOUNTER — Ambulatory Visit: Payer: Medicaid Other | Admitting: Nurse Practitioner

## 2022-05-17 ENCOUNTER — Telehealth: Payer: Self-pay | Admitting: Nurse Practitioner

## 2022-05-17 ENCOUNTER — Ambulatory Visit (INDEPENDENT_AMBULATORY_CARE_PROVIDER_SITE_OTHER): Payer: Medicaid Other | Admitting: Family Medicine

## 2022-05-17 DIAGNOSIS — B3731 Acute candidiasis of vulva and vagina: Secondary | ICD-10-CM

## 2022-05-17 MED ORDER — FLUCONAZOLE 150 MG PO TABS: 150.0000 mg | ORAL_TABLET | Freq: Once | ORAL | 0 refills | Status: AC

## 2022-05-17 NOTE — Telephone Encounter (Signed)
Spoke with pt no one called her

## 2022-05-17 NOTE — Telephone Encounter (Signed)
Spoke with pt

## 2022-05-17 NOTE — Progress Notes (Signed)
Virtual Visit via Telephone Note   This visit type was conducted due to national recommendations for restrictions regarding the COVID-19 Pandemic (e.g. social distancing) in an effort to limit this patient's exposure and mitigate transmission in our community.  Due to her co-morbid illnesses, this patient is at least at moderate risk for complications without adequate follow up.  This format is felt to be most appropriate for this patient at this time.  The patient did not have access to video technology/had technical difficulties with video requiring transitioning to audio format only (telephone).  All issues noted in this document were discussed and addressed.  No physical exam could be performed with this format.  Please refer to the patient's chart for her  consent to telehealth for [facility].   Evaluation Performed:  Acute visit  Date:  05/17/2022   ID:  Bethany Gomez, DOB [birth year], MRN [id number]  Patient Location: Home Provider Location: Office/Clinic  Participants: Patient Location of Patient: Home Location of Provider: Telehealth Consent was obtain for visit to be over via telehealth. I verified that I am speaking with the correct person using two identifiers.  PCP:  Donell Beers, FNP   Chief Complaint:  vaginal discharge and itching  History of Present Illness:    Bethany Gomez is a 31 y.o. female  with c/o of of a thick cottage cheese-like discharge with vulvar itching. The onset of symptoms was on 05/06/22, with reoccurrence on 05/15/22. She has been using otc monostate with minimum relief.   The patient does not have symptoms concerning for COVID-19 infection (fever, chills, cough, or new shortness of breath).   Past Medical, Surgical, Social History, Allergies, and Medications have been Reviewed.  Past Medical History:  Diagnosis Date   Headaches, cluster    History of gestational hypertension 02/09/2017   History of prior pregnancy with IUGR newborn  03/20/2017   3lb14oz @ 37wks      Hypertension    Mono exposure    Premenstrual dysphoric disorder    UTI (lower urinary tract infection)    Past Surgical History:  Procedure Laterality Date   NO PAST SURGERIES       Current Meds  Medication Sig   amLODipine (NORVASC) 10 MG tablet Take 1 tablet (10 mg total) by mouth daily.   cetirizine (ZYRTEC) 10 MG tablet Take 1 tablet (10 mg total) by mouth daily.   fluconazole (DIFLUCAN) 150 MG tablet Take 1 tablet (150 mg total) by mouth once for 1 dose.   losartan-hydrochlorothiazide (HYZAAR) 50-12.5 MG tablet Take 1 tablet by mouth daily.     Allergies:   Latex   ROS:   Please see the history of present illness.     All other systems reviewed and are negative.   Labs/Other Tests and Data Reviewed:    Recent Labs: 12/08/2021: ALT 19; BUN 9; Creatinine, Ser 0.71; Potassium 4.3; Sodium 141 02/16/2022: Hemoglobin 13.6; Platelets 327; TSH 1.550   Recent Lipid Panel Lab Results  Component Value Date/Time   CHOL 176 02/16/2022 08:25 AM   TRIG 78 02/16/2022 08:25 AM   HDL 37 (L) 02/16/2022 08:25 AM   CHOLHDL 4.8 (H) 02/16/2022 08:25 AM   LDLCALC 124 (H) 02/16/2022 08:25 AM    Wt Readings from Last 3 Encounters:  04/19/22 185 lb (83.9 kg)  02/15/22 188 lb (85.3 kg)  02/01/22 187 lb (84.8 kg)     Objective:    Vital Signs:  LMP 05/16/2022 (Approximate)  ASSESSMENT & PLAN:   Vulvovaginal Candidiasis Diflucan ordered Time:   Today, I have spent 8 minutes reviewing the chart, including problem list, medications, and with the patient with telehealth technology discussing the above problems.   Medication Adjustments/Labs and Tests Ordered: Current medicines are reviewed at length with the patient today.  Concerns regarding medicines are outlined above.   Tests Ordered: No orders of the defined types were placed in this encounter.   Medication Changes: Meds ordered this encounter  Medications   fluconazole (DIFLUCAN)  150 MG tablet    Sig: Take 1 tablet (150 mg total) by mouth once for 1 dose.    Dispense:  1 tablet    Refill:  0     Note: This dictation was prepared with Dragon dictation along with smaller phrase technology. Similar sounding words can be transcribed inadequately or may not be corrected upon review. Any transcriptional errors that result from this process are unintentional.      Disposition:  Follow up  Signed, Gilmore Laroche, FNP  05/17/2022 1:39 PM     [facility]

## 2022-05-17 NOTE — Telephone Encounter (Signed)
Pt return call °

## 2022-05-17 NOTE — Telephone Encounter (Signed)
Patient returning a call. °

## 2022-06-28 ENCOUNTER — Telehealth: Payer: Self-pay

## 2022-06-28 ENCOUNTER — Other Ambulatory Visit: Payer: Self-pay

## 2022-06-28 DIAGNOSIS — R058 Other specified cough: Secondary | ICD-10-CM

## 2022-06-28 MED ORDER — CETIRIZINE HCL 10 MG PO TABS: 10.0000 mg | ORAL_TABLET | Freq: Every day | ORAL | 5 refills | Status: DC

## 2022-06-28 NOTE — Telephone Encounter (Signed)
Pt medication sent °

## 2022-06-28 NOTE — Telephone Encounter (Signed)
Patient called need med refill  cetirizine (ZYRTEC) 10 MG tablet [place], [place] - [address]  [address] [place], [place] [address]  Phone:  [phone]  Fax:  [phone]

## 2022-08-01 ENCOUNTER — Ambulatory Visit: Payer: Medicaid Other | Admitting: Family Medicine

## 2022-08-17 ENCOUNTER — Encounter: Payer: Self-pay | Admitting: Family Medicine

## 2022-08-17 ENCOUNTER — Ambulatory Visit: Payer: Medicaid Other | Admitting: Family Medicine

## 2022-09-16 ENCOUNTER — Ambulatory Visit (INDEPENDENT_AMBULATORY_CARE_PROVIDER_SITE_OTHER): Payer: Self-pay | Admitting: Family Medicine

## 2022-09-16 ENCOUNTER — Encounter: Payer: Self-pay | Admitting: Family Medicine

## 2022-09-16 VITALS — Ht 63.0 in | Wt 185.0 lb

## 2022-09-16 DIAGNOSIS — B349 Viral infection, unspecified: Secondary | ICD-10-CM

## 2022-09-16 NOTE — Progress Notes (Signed)
Virtual Visit via Telephone Note   This visit type was conducted via telephone. This format is felt to be most appropriate for this patient at this time.  The patient did not have access to video technology/had technical difficulties with video requiring transitioning to audio format only (telephone).  All issues noted in this document were discussed and addressed.  No physical exam could be performed with this format.  Evaluation Performed:  Follow-up visit  Date:  09/16/2022   ID:  Bethany Gomez, DOB 19-Dec-1990, MRN [id number]  Patient Location: Home Provider Location: Clinic  Participants: Patient Location of Patient: Home Location of Provider: Clinic Consent was obtain for visit to be over via telehealth. I verified that I am speaking with the correct person using two identifiers.  PCP:  Gilmore Laroche, FNP   Chief Complaint: Cough and loss of smell and states  History of Present Illness:    Bethany Gomez is a 31 y.o. female with cough, loss of smell and taste, and shortness of breath.  Onset of symptoms 09/12/2022.  She denies fever, chills, muscle aches, and runny nose.     The patient does have symptoms concerning for COVID-19 infection (fever, chills, cough, or new shortness of breath).   Past Medical, Surgical, Social History, Allergies, and Medications have been Reviewed.  Past Medical History:  Diagnosis Date   Headaches, cluster    History of gestational hypertension 02/09/2017   History of prior pregnancy with IUGR newborn 03/20/2017   3lb14oz @ 37wks      Hypertension    Mono exposure    Premenstrual dysphoric disorder    UTI (lower urinary tract infection)    Past Surgical History:  Procedure Laterality Date   NO PAST SURGERIES       Current Meds  Medication Sig   amLODipine (NORVASC) 10 MG tablet Take 1 tablet (10 mg total) by mouth daily.   cetirizine (ZYRTEC) 10 MG tablet Take 1 tablet (10 mg total) by mouth daily.   Cholecalciferol (VITAMIN  D3) 25 MCG (1000 UT) CAPS Take 1 capsule (1,000 Units total) by mouth daily.   losartan-hydrochlorothiazide (HYZAAR) 50-12.5 MG tablet Take 1 tablet by mouth daily.   nicotine (NICODERM CQ - DOSED IN MG/24 HR) 7 mg/24hr patch Place 1 patch (7 mg total) onto the skin daily.   nicotine polacrilex (NICORETTE) 2 MG gum Take 1 each (2 mg total) by mouth as needed for smoking cessation.   norethindrone (MICRONOR) 0.35 MG tablet Take 1 tablet (0.35 mg total) by mouth daily.   vitamin B-12 (CYANOCOBALAMIN) 500 MCG tablet Take 500 mcg by mouth daily.     Allergies:   Latex   ROS:   Please see the history of present illness.     All other systems reviewed and are negative.   Labs/Other Tests and Data Reviewed:    Recent Labs: 12/08/2021: ALT 19; BUN 9; Creatinine, Ser 0.71; Potassium 4.3; Sodium 141 02/16/2022: Hemoglobin 13.6; Platelets 327; TSH 1.550   Recent Lipid Panel Lab Results  Component Value Date/Time   CHOL 176 02/16/2022 08:25 AM   TRIG 78 02/16/2022 08:25 AM   HDL 37 (L) 02/16/2022 08:25 AM   CHOLHDL 4.8 (H) 02/16/2022 08:25 AM   LDLCALC 124 (H) 02/16/2022 08:25 AM    Wt Readings from Last 3 Encounters:  09/16/22 185 lb (83.9 kg)  04/19/22 185 lb (83.9 kg)  02/15/22 188 lb (85.3 kg)     Objective:    Vital Signs:  Ht 5\' 3"  (1.6 m)   Wt 185 lb (83.9 kg)   BMI 32.77 kg/m      ASSESSMENT & PLAN:   Viral illness Will test the patient for COVID Encouraged to continue taking coricidin for chest congestion and cough Increase fluids and allow for plenty of rest. Recommend Tylenol or ibuprofen as needed for pain, fever, or general discomfort. Recommend using a humidifier at bedtime during sleep to help with cough and nasal congestion. Follow-up if your symptoms do not improve    Time:   Today, I have spent 12 minutes reviewing the chart, including problem list, medications, and with the patient with telehealth technology discussing the above problems.   Medication  Adjustments/Labs and Tests Ordered: Current medicines are reviewed at length with the patient today.  Concerns regarding medicines are outlined above.   Tests Ordered: No orders of the defined types were placed in this encounter.   Medication Changes: No orders of the defined types were placed in this encounter.    Note: This dictation was prepared with Dragon dictation along with smaller phrase technology. Similar sounding words can be transcribed inadequately or may not be corrected upon review. Any transcriptional errors that result from this process are unintentional.      Disposition:  Follow up  Signed, , FNP  09/16/2022 10:51 AM     09/18/[facility]22 [facility]

## 2022-09-16 NOTE — Addendum Note (Signed)
Addended by: Marlana Salvage on: 09/16/2022 11:26 AM   Modules accepted: Orders

## 2022-09-18 LAB — NOVEL CORONAVIRUS, NAA: SARS-CoV-2, NAA: NOT DETECTED

## 2022-09-20 NOTE — Progress Notes (Signed)
Please inform the patient that she tested negative for COVID

## 2022-10-18 ENCOUNTER — Encounter: Payer: Self-pay | Admitting: Adult Health

## 2022-10-18 ENCOUNTER — Other Ambulatory Visit (HOSPITAL_COMMUNITY)
Admission: RE | Admit: 2022-10-18 | Discharge: 2022-10-18 | Disposition: A | Discharge status: Home / Self Care | Payer: Medicaid Other | Source: Ambulatory Visit | Attending: Adult Health | Admitting: Adult Health

## 2022-10-18 ENCOUNTER — Ambulatory Visit (INDEPENDENT_AMBULATORY_CARE_PROVIDER_SITE_OTHER): Payer: Medicaid Other | Admitting: Adult Health

## 2022-10-18 VITALS — BP 142/102 | HR 76 | Ht 63.0 in | Wt 187.0 lb

## 2022-10-18 DIAGNOSIS — Z0000 Encounter for general adult medical examination without abnormal findings: Secondary | ICD-10-CM | POA: Diagnosis present

## 2022-10-18 DIAGNOSIS — Z01419 Encounter for gynecological examination (general) (routine) without abnormal findings: Secondary | ICD-10-CM | POA: Diagnosis not present

## 2022-10-18 DIAGNOSIS — Z3009 Encounter for other general counseling and advice on contraception: Secondary | ICD-10-CM | POA: Diagnosis present

## 2022-10-18 DIAGNOSIS — I10 Essential (primary) hypertension: Secondary | ICD-10-CM

## 2022-10-18 DIAGNOSIS — Z3202 Encounter for pregnancy test, result negative: Secondary | ICD-10-CM | POA: Diagnosis not present

## 2022-10-18 DIAGNOSIS — Z113 Encounter for screening for infections with a predominantly sexual mode of transmission: Secondary | ICD-10-CM

## 2022-10-18 LAB — POCT URINE PREGNANCY: Preg Test, Ur: NEGATIVE

## 2022-10-18 NOTE — Progress Notes (Signed)
Patient ID: Bethany Gomez, female   DOB: 14-Feb-1991, 32 y.o.   MRN: [id number] History of Present Illness: Bethany Gomez is  32 year old black female,single, G1P1001, in for a well woman gyn exam and pap. And wants to discuss birth control. She has family planning medicaid.  PCP is Alvira Monday NP.   Current Medications, Allergies, Past Medical History, Past Surgical History, Family History and Social History were reviewed in [employer] record.     Review of Systems: Patient denies any headaches, hearing loss, fatigue, blurred vision, shortness of breath, chest pain, abdominal pain, problems with bowel movements, urination, or intercourse. No joint pain or mood swings.  She had unprotected sex Friday, first sex in a while.  Physical Exam:BP (!) 142/102 (BP Location: Right Arm, Patient Position: Sitting, Cuff Size: Normal)   Pulse 76   Ht 5\' 3"  (1.6 m)   Wt 187 lb (84.8 kg)   LMP 10/04/2022 (Approximate)   BMI 33.13 kg/m  UPT is negative. General:  Well developed, well nourished, no acute distress Skin:  Warm and dry Neck:  Midline trachea, normal thyroid, good ROM, no lymphadenopathy Lungs; Clear to auscultation bilaterally Breast:  No dominant palpable mass, retraction, or nipple discharge Cardiovascular: Regular rate and rhythm Abdomen:  Soft, non tender, no hepatosplenomegaly Pelvic:  External genitalia is normal in appearance, no lesions.  The vagina is normal in appearance. Urethra has no lesions or masses. The cervix is bulbous. Pap with GC/CHL and HR HPV genotyping performed. Uterus is felt to be normal size, shape, and contour.  No adnexal masses or tenderness noted.Bladder is non tender, no masses felt. Extremities/musculoskeletal:  No swelling or varicosities noted, no clubbing or cyanosis Psych:  No mood changes, alert and cooperative,seems happy  Examination chaperoned by Levy Pupa LPN   Bethany Gomez is 4 Fall risk is low    10/18/2022   11:35 AM 05/17/2022     1:18 PM 04/19/2022   10:33 AM  Depression screen PHQ 2/9  Decreased Interest 1 0 0  Down, Depressed, Hopeless 0 0 0  PHQ - 2 Score 1 0 0  Altered sleeping 3    Tired, decreased energy 1    Change in appetite 2    Feeling bad or failure about yourself  0    Trouble concentrating 0    Moving slowly or fidgety/restless 0    Suicidal thoughts 0    PHQ-9 Score 7     Stopped  meds 3 years ago.     10/18/2022   11:35 AM  GAD 7 : Generalized Anxiety Score  Nervous, Anxious, on Edge 2  Control/stop worrying 1  Worry too much - different things 1  Trouble relaxing 0  Restless 0  Easily annoyed or irritable 0  Afraid - awful might happen 0  Total GAD 7 Score 4    Upstream - 10/18/22 1131       Pregnancy Intention Screening   Does the patient want to become pregnant in the next year? No    Does the patient's partner want to become pregnant in the next year? No    Would the patient like to discuss contraceptive options today? Yes      Contraception Wrap Up   Current Method Female Condom    End Method Abstinence   will get scheduled for nexplanon   Contraception Counseling Provided Yes               Impression and Plan: 1. Routine  general medical examination at a health care facility Pap sent Pap in 3 years if normal Physical in 1 year - Cytology - PAP( [place])  2. Family planning Pap sent with GC/CHL Check labs  - Cytology - PAP( [place]) - Hepatitis C antibody - Hepatitis B surface antigen - HIV Antibody (routine testing w rflx) - [facility]  3. Pregnancy examination or test, negative result - POCT urine pregnancy  4. Screen for STD (sexually transmitted disease) - Hepatitis C antibody - Hepatitis B surface antigen - HIV Antibody (routine testing w rflx) - [facility]  5. General counseling and advice for contraceptive management Discussed options since BP not well controlled POP, Depo, IUD or nexplanon and she wants to get nexplanon, has had before No  sex Return 10/31/22 for nexplanon insertion  6. Hypertension, unspecified type Last BP meds Sunday, try to take every day And follow up with PCP She is taking Norvasc 10 mg 1 daily and Hyzaar 50-12.5 mg daily Try setting alarm on phone or sticky notes

## 2022-10-19 LAB — RPR: RPR Ser Ql: NONREACTIVE

## 2022-10-19 LAB — HIV ANTIBODY (ROUTINE TESTING W REFLEX): HIV Screen 4th Generation wRfx: NONREACTIVE

## 2022-10-19 LAB — HEPATITIS B SURFACE ANTIGEN: Hepatitis B Surface Ag: NEGATIVE

## 2022-10-19 LAB — HEPATITIS C ANTIBODY: Hep C Virus Ab: NONREACTIVE

## 2022-10-24 LAB — CYTOLOGY - PAP
Chlamydia: NEGATIVE
Comment: NEGATIVE
Comment: NEGATIVE
Comment: NORMAL
Diagnosis: UNDETERMINED — AB
High risk HPV: NEGATIVE
Neisseria Gonorrhea: NEGATIVE

## 2022-10-25 ENCOUNTER — Encounter: Payer: Self-pay | Admitting: Adult Health

## 2022-10-25 DIAGNOSIS — R87610 Atypical squamous cells of undetermined significance on cytologic smear of cervix (ASC-US): Secondary | ICD-10-CM | POA: Insufficient documentation

## 2022-10-31 ENCOUNTER — Ambulatory Visit (INDEPENDENT_AMBULATORY_CARE_PROVIDER_SITE_OTHER): Payer: Medicaid Other | Admitting: Adult Health

## 2022-10-31 ENCOUNTER — Encounter: Payer: Self-pay | Admitting: Adult Health

## 2022-10-31 VITALS — BP 141/98 | HR 100 | Ht 63.0 in | Wt 187.5 lb

## 2022-10-31 DIAGNOSIS — Z30017 Encounter for initial prescription of implantable subdermal contraceptive: Secondary | ICD-10-CM

## 2022-10-31 DIAGNOSIS — Z3202 Encounter for pregnancy test, result negative: Secondary | ICD-10-CM | POA: Diagnosis not present

## 2022-10-31 LAB — POCT URINE PREGNANCY: Preg Test, Ur: NEGATIVE

## 2022-10-31 MED ORDER — ETONOGESTREL 68 MG ~~LOC~~ IMPL
68.0000 mg | DRUG_IMPLANT | Freq: Once | SUBCUTANEOUS | Status: AC
  Administered 2022-10-31: 68 mg via SUBCUTANEOUS

## 2022-10-31 NOTE — Patient Instructions (Signed)
Use condoms x 2 weeks, keep clean and dry x 24 hours, no heavy lifting, keep steri strips on x 72 hours, Keep pressure dressing on x 24 hours. Follow up prn problems.  

## 2022-10-31 NOTE — Addendum Note (Signed)
Addended by: Linton Rump on: 10/31/2022 12:05 PM   Modules accepted: Orders

## 2022-10-31 NOTE — Progress Notes (Addendum)
  Subjective:     Patient ID: Drue Dun, female   DOB: [birth year], 32 y.o.   MRN: [id number]  HPI [place] is 32 year old black female, single, G1P1001 in for nexplanon insertion.  Last pap was ASCUS negative HPV 10/18/22.  PCP is Alvira Monday NP  Review of Systems For nexplanon insertion Reviewed past medical,surgical, social and family history. Reviewed medications and allergies.     Objective:   Physical Exam BP (!) 141/98 (BP Location: Right Arm, Patient Position: Sitting, Cuff Size: Normal)   Pulse 100   Ht 5\' 3"  (1.6 m)   Wt 187 lb 8 oz (85 kg)   LMP 10/04/2022 (Approximate)   BMI 33.21 kg/m  UPT is negative. Consent signed, time out called. Left arm cleansed with betadine, and injected with 1.5 cc 2% lidocaine and waited til numb. Nexplanon easily inserted and steri strips applied.Rod easily palpated by provider and pt. Pressure dressing applied.      Upstream - 10/31/22 1105       Pregnancy Intention Screening   Does the patient want to become pregnant in the next year? No    Does the patient's partner want to become pregnant in the next year? No    Would the patient like to discuss contraceptive options today? No      Contraception Wrap Up   Current Method Female Condom    End Method Hormonal Implant             Assessment:     1. Pregnancy examination or test, negative result   2. Nexplanon insertion Lot  [id number] Exp 2025-11    Use condoms x 2 weeks, keep clean and dry x 24 hours, no heavy lifting, keep steri strips on x 72 hours, Keep pressure dressing on x 24 hours. Follow up prn problems.  Plan:     Follow up prn

## 2022-11-16 ENCOUNTER — Ambulatory Visit (INDEPENDENT_AMBULATORY_CARE_PROVIDER_SITE_OTHER): Payer: Self-pay | Admitting: Family Medicine

## 2022-11-16 ENCOUNTER — Encounter: Payer: Self-pay | Admitting: Family Medicine

## 2022-11-16 VITALS — BP 132/86 | HR 99 | Ht 63.0 in | Wt 191.0 lb

## 2022-11-16 DIAGNOSIS — I10 Essential (primary) hypertension: Secondary | ICD-10-CM

## 2022-11-16 DIAGNOSIS — N760 Acute vaginitis: Secondary | ICD-10-CM

## 2022-11-16 DIAGNOSIS — E038 Other specified hypothyroidism: Secondary | ICD-10-CM

## 2022-11-16 DIAGNOSIS — E785 Hyperlipidemia, unspecified: Secondary | ICD-10-CM

## 2022-11-16 DIAGNOSIS — R7301 Impaired fasting glucose: Secondary | ICD-10-CM

## 2022-11-16 DIAGNOSIS — E669 Obesity, unspecified: Secondary | ICD-10-CM

## 2022-11-16 DIAGNOSIS — E559 Vitamin D deficiency, unspecified: Secondary | ICD-10-CM

## 2022-11-16 MED ORDER — AMLODIPINE BESYLATE 10 MG PO TABS: 10.0000 mg | ORAL_TABLET | Freq: Every day | ORAL | 1 refills | Status: DC

## 2022-11-16 MED ORDER — LOSARTAN POTASSIUM-HCTZ 50-12.5 MG PO TABS: 1.0000 | ORAL_TABLET | Freq: Every day | ORAL | 1 refills | Status: DC

## 2022-11-16 NOTE — Assessment & Plan Note (Signed)
Reports increased weight gain Reports adherence to a heart healthy diet and increased physical activity Will like to resume therapy with phentermine Encouraged lifestyle modification with a heart healthy diet and increased physical activity Review my weight management plan with the patient Will follow up in 3 months Wt Readings from Last 3 Encounters:  11/16/22 191 lb (86.6 kg)  10/31/22 187 lb 8 oz (85 kg)  10/18/22 187 lb (84.8 kg)

## 2022-11-16 NOTE — Progress Notes (Signed)
Established Patient Office Visit  Subjective:  Patient ID: Bethany Gomez, female    DOB: July 09, 1991  Age: 32 y.o. MRN: [id number]  CC:  Chief Complaint  Patient presents with   Follow-up    Needs refill on bp medication. Pt would like to discuss weight gain has tried to lose weight and has not been able to would like to start back on phentermine.     HPI Bethany Gomez is a 32 y.o. female with past medical history of hypertension, obesity and depression presents for f/u of  chronic medical conditions. For the details of today's visit, please refer to the assessment and plan.     Past Medical History:  Diagnosis Date   Headaches, cluster    History of gestational hypertension 02/09/2017   History of prior pregnancy with IUGR newborn 03/20/2017   3lb14oz @ 37wks      Hypertension    Mono exposure    Premenstrual dysphoric disorder    UTI (lower urinary tract infection)     Past Surgical History:  Procedure Laterality Date   NO PAST SURGERIES      Family History  Problem Relation Age of Onset   Diabetes Mother    Fibromyalgia Mother    Hypertension Mother    Congestive Heart Failure Father    Narcolepsy Father    Aneurysm Father    Congestive Heart Failure Paternal Uncle    Diabetes Maternal Grandmother    Congestive Heart Failure Maternal Grandmother    Diabetes Maternal Grandfather    Congestive Heart Failure Maternal Grandfather    Diabetes Paternal Grandmother    Diabetes Paternal Grandfather    Congestive Heart Failure Paternal Grandfather    Colon cancer Neg Hx    Breast cancer Neg Hx     Social History   Socioeconomic History   Marital status: Single    Spouse name: Not on file   Number of children: 1   Years of education: Not on file   Highest education level: Not on file  Occupational History   Occupation: Mail order pharmacist    Comment: [employer]  Tobacco Use   Smoking status: Every Day    Packs/day: 0.50    Years: 5.00    Total pack years:  2.50    Types: Cigarettes   Smokeless tobacco: Never  Vaping Use   Vaping Use: Never used  Substance and Sexual Activity   Alcohol use: Yes    Comment: rarely in social setting; maybe once per month   Drug use: Yes    Types: Marijuana   Sexual activity: Yes    Birth control/protection: Condom  Other Topics Concern   Not on file  Social History Narrative   Lives with her child, not currently.    Social Determinants of Health   Financial Resource Strain: Low Risk  (10/18/2022)   Overall Financial Resource Strain (CARDIA)    Difficulty of Paying Living Expenses: Not very hard  Food Insecurity: No Food Insecurity (10/18/2022)   Hunger Vital Sign    Worried About Running Out of Food in the Last Year: Never true    Ran Out of Food in the Last Year: Never true  Transportation Needs: Unmet Transportation Needs (10/18/2022)   PRAPARE - Transportation    Lack of Transportation (Medical): Yes    Lack of Transportation (Non-Medical): Yes  Physical Activity: Insufficiently Active (10/18/2022)   Exercise Vital Sign    Days of Exercise per Week: 2 days  Minutes of Exercise per Session: 30 min  Stress: Stress Concern Present (10/18/2022)   [place]    Feeling of Stress : Very much  Social Connections: Socially Isolated (10/18/2022)   Social Connection and Isolation Panel [NHANES]    Frequency of Communication with Friends and Family: More than three times a week    Frequency of Social Gatherings with Friends and Family: Never    Attends Religious Services: Never    Marine scientist or Organizations: No    Attends Archivist Meetings: Never    Marital Status: Never married  Intimate Partner Violence: Not At Risk (10/18/2022)   Humiliation, Afraid, Rape, and Kick questionnaire    Fear of Current or Ex-Partner: No    Emotionally Abused: No    Physically Abused: No    Sexually Abused: No    Outpatient  Medications Prior to Visit  Medication Sig Dispense Refill   amLODipine (NORVASC) 10 MG tablet Take 1 tablet (10 mg total) by mouth daily. 90 tablet 1   cetirizine (ZYRTEC) 10 MG tablet Take 1 tablet (10 mg total) by mouth daily. (Patient not taking: Reported on 11/16/2022) 30 tablet 5   losartan-hydrochlorothiazide (HYZAAR) 50-12.5 MG tablet Take 1 tablet by mouth daily. (Patient not taking: Reported on 11/16/2022) 90 tablet 1   No facility-administered medications prior to visit.    Allergies  Allergen Reactions   Latex Hives, Itching and Other (See Comments)    Reaction:  Burning    ROS Review of Systems  Constitutional:  Negative for chills and fever.  Eyes:  Negative for visual disturbance.  Respiratory:  Negative for chest tightness and shortness of breath.   Genitourinary:  Positive for vaginal discharge. Negative for decreased urine volume, urgency, vaginal bleeding and vaginal pain.  Neurological:  Negative for dizziness and headaches.      Objective:    Physical Exam HENT:     Head: Normocephalic.     Mouth/Throat:     Mouth: Mucous membranes are moist.  Cardiovascular:     Rate and Rhythm: Normal rate.     Heart sounds: Normal heart sounds.  Pulmonary:     Effort: Pulmonary effort is normal.     Breath sounds: Normal breath sounds.  Neurological:     Mental Status: She is alert.     BP 132/86 (BP Location: Right Arm)   Pulse 99   Ht 5' 3"$  (1.6 m)   Wt 191 lb (86.6 kg)   LMP 10/04/2022 (Approximate)   SpO2 95%   BMI 33.83 kg/m  Wt Readings from Last 3 Encounters:  11/16/22 191 lb (86.6 kg)  10/31/22 187 lb 8 oz (85 kg)  10/18/22 187 lb (84.8 kg)    Lab Results  Component Value Date   TSH 1.550 02/16/2022   Lab Results  Component Value Date   WBC 7.2 02/16/2022   HGB 13.6 02/16/2022   HCT 42.6 02/16/2022   MCV 84 02/16/2022   PLT 327 02/16/2022   Lab Results  Component Value Date   NA 141 12/08/2021   K 4.3 12/08/2021   CO2 18 (L)  12/08/2021   GLUCOSE 91 12/08/2021   BUN 9 12/08/2021   CREATININE 0.71 12/08/2021   BILITOT <0.2 12/08/2021   ALKPHOS 73 12/08/2021   AST 16 12/08/2021   ALT 19 12/08/2021   PROT 7.0 12/08/2021   ALBUMIN 4.3 12/08/2021   CALCIUM 9.2 12/08/2021   ANIONGAP 8 01/28/2020  EGFR 118 12/08/2021   Lab Results  Component Value Date   CHOL 176 02/16/2022   Lab Results  Component Value Date   HDL 37 (L) 02/16/2022   Lab Results  Component Value Date   LDLCALC 124 (H) 02/16/2022   Lab Results  Component Value Date   TRIG 78 02/16/2022   Lab Results  Component Value Date   CHOLHDL 4.8 (H) 02/16/2022   Lab Results  Component Value Date   HGBA1C 5.3 02/16/2022      Assessment & Plan:  Hypertension, unspecified type Assessment & Plan: Controlled Refilled amlodipine 10 mg and losartan hydrochlorothiazide 50-12.5 Patient is asymptomatic Encouraged low-sodium diet with increased physical activity BP Readings from Last 3 Encounters:  11/16/22 132/86  10/31/22 (!) 141/98  10/18/22 (!) 142/102      Orders: -     amLODIPine Besylate; Take 1 tablet (10 mg total) by mouth daily.  Dispense: 90 tablet; Refill: 1 -     Losartan Potassium-HCTZ; Take 1 tablet by mouth daily.  Dispense: 90 tablet; Refill: 1 -     CMP14+EGFR -     CBC with Differential/Platelet  Obesity (BMI 30.0-34.9) Assessment & Plan: Reports increased weight gain Reports adherence to a heart healthy diet and increased physical activity Will like to resume therapy with phentermine Encouraged lifestyle modification with a heart healthy diet and increased physical activity Review my weight management plan with the patient Will follow up in 3 months Wt Readings from Last 3 Encounters:  11/16/22 191 lb (86.6 kg)  10/31/22 187 lb 8 oz (85 kg)  10/18/22 187 lb (84.8 kg)      Acute vaginitis Assessment & Plan: Reports vulvar itching and vaginal discharge Vaginal discharge is order less Last Coital act was  2 weeks ago Pending NuSwab  Orders: -     NuSwab Vaginitis Plus (VG+)  Hyperlipidemia LDL goal <100 -     Lipid panel  Vitamin D deficiency -     VITAMIN D 25 Hydroxy (Vit-D Deficiency, Fractures)  IFG (impaired fasting glucose) -     Hemoglobin A1c  Other specified hypothyroidism -     TSH + free T4    Follow-up: Return in about 3 months (around 02/14/2023).   Alvira Monday, FNP

## 2022-11-16 NOTE — Assessment & Plan Note (Signed)
Controlled Refilled amlodipine 10 mg and losartan hydrochlorothiazide 50-12.5 Patient is asymptomatic Encouraged low-sodium diet with increased physical activity BP Readings from Last 3 Encounters:  11/16/22 132/86  10/31/22 (!) 141/98  10/18/22 (!) 142/102

## 2022-11-16 NOTE — Patient Instructions (Addendum)
I appreciate the opportunity to provide care to you today!    Follow up:  3 months  Labs: please stop by the lab during the week to get your blood drawn (CBC, CMP, TSH, Lipid profile, HgA1c, Vit D)   Please continue to a heart-healthy diet and increase your physical activities. Try to exercise for 65mns at least five times a week.   Physical activity helps: Lower your blood glucose, improve your heart health, lower your blood pressure and cholesterol, burn calories to help manage her weight, gave you energy, lower stress, and improve his sleep.  The American diabetes Association (ADA) recommends being active for 2-1/2 hours (150 minutes) or more week.  Exercise for 30 minutes, 5 days a week (150 minutes total)    It was a pleasure to see you and I look forward to continuing to work together on your health and well-being. Please do not hesitate to call the office if you need care or have questions about your care.   Have a wonderful day and week. With Gratitude, GAlvira MondayMSN, FNP-BC

## 2022-11-16 NOTE — Assessment & Plan Note (Signed)
Reports vulvar itching and vaginal discharge Vaginal discharge is order less Last Coital act was 2 weeks ago Pending NuSwab

## 2022-11-17 ENCOUNTER — Telehealth: Payer: Self-pay | Admitting: Family Medicine

## 2022-11-17 NOTE — Telephone Encounter (Signed)
Patient called in for lab results  ?

## 2022-11-17 NOTE — Telephone Encounter (Signed)
Spoke with patient. All labs still say active, no results.

## 2022-11-19 ENCOUNTER — Other Ambulatory Visit: Payer: Self-pay | Admitting: Family Medicine

## 2022-11-19 DIAGNOSIS — B3731 Acute candidiasis of vulva and vagina: Secondary | ICD-10-CM

## 2022-11-19 MED ORDER — FLUCONAZOLE 150 MG PO TABS: 150.0000 mg | ORAL_TABLET | Freq: Once | ORAL | 0 refills | Status: AC

## 2022-11-19 NOTE — Progress Notes (Signed)
Please inform the patient that she has a yeast infection; I've sent a prescription for Diflucan to her pharmacy.

## 2022-11-20 LAB — NUSWAB VAGINITIS PLUS (VG+)
Candida albicans, NAA: POSITIVE — AB
Candida glabrata, NAA: NEGATIVE
Chlamydia trachomatis, NAA: NEGATIVE
Neisseria gonorrhoeae, NAA: NEGATIVE
Trich vag by NAA: NEGATIVE

## 2022-12-22 ENCOUNTER — Ambulatory Visit: Payer: Medicaid Other | Admitting: Family Medicine

## 2022-12-22 VITALS — BP 144/102 | HR 96 | Ht 63.0 in | Wt 196.0 lb

## 2022-12-22 DIAGNOSIS — R102 Pelvic and perineal pain: Secondary | ICD-10-CM | POA: Diagnosis not present

## 2022-12-22 DIAGNOSIS — Z720 Tobacco use: Secondary | ICD-10-CM | POA: Diagnosis not present

## 2022-12-22 DIAGNOSIS — I10 Essential (primary) hypertension: Secondary | ICD-10-CM | POA: Diagnosis not present

## 2022-12-22 DIAGNOSIS — M25552 Pain in left hip: Secondary | ICD-10-CM | POA: Diagnosis not present

## 2022-12-22 NOTE — Progress Notes (Signed)
Established Patient Office Visit  Subjective:  Patient ID: Bethany Gomez, female    DOB: Jul 19, 1991  Age: 32 y.o. MRN: [id number]  CC:  Chief Complaint  Patient presents with   Hypertension    Pt reports when taking both bp medications she feels nauseous, and slight dizzy. Has been taking them separately and sometimes takes one and not the other.    Hip Pain    Pt report left side pain that radiates down onset of this began 12/01/2022. Pt reports flank pain also.     HPI Bethany Gomez is a 32 y.o. female with past medical history of hypertension presents for f/u.  Hypertension: Reports only taking amlodipine 10 mg today.  She complains of dizziness when she takes amlodipine 10 mg and losartan -hydrochlorothiazide 50-12.5 mg.  She complains of dizziness recently.  Pelvic pain: She complains of anterior pelvic pain during masturbation and coital acts.  Pain is only reported during those activities.  No history of fibroids or endometrial polyps.  No history of ovarian cysts.  No fever or chills reported.  The patient does nexplanon contraceptive.  Hip pain: No recent injury or trauma reported .Denies  a popping or clicking sensation, weakness or instability of the hip.  No limitation in ADLs or range of motion.  Denies limping while ambulating, noting that her gait is normal.  No stiffness or pain in the hip joints.  Tobacco use: She reports that 1 pack of cigarettes typically lasts her 2 days.  Past Medical History:  Diagnosis Date   Headaches, cluster    History of gestational hypertension 02/09/2017   History of prior pregnancy with IUGR newborn 03/20/2017   3lb14oz @ 37wks      Hypertension    Mono exposure    Premenstrual dysphoric disorder    UTI (lower urinary tract infection)     Past Surgical History:  Procedure Laterality Date   NO PAST SURGERIES      Family History  Problem Relation Age of Onset   Diabetes Mother    Fibromyalgia Mother    Hypertension Mother     Congestive Heart Failure Father    Narcolepsy Father    Aneurysm Father    Congestive Heart Failure Paternal Uncle    Diabetes Maternal Grandmother    Congestive Heart Failure Maternal Grandmother    Diabetes Maternal Grandfather    Congestive Heart Failure Maternal Grandfather    Diabetes Paternal Grandmother    Diabetes Paternal Grandfather    Congestive Heart Failure Paternal Grandfather    Colon cancer Neg Hx    Breast cancer Neg Hx     Social History   Socioeconomic History   Marital status: Single    Spouse name: Not on file   Number of children: 1   Years of education: Not on file   Highest education level: Some college, no degree  Occupational History   Occupation: Pension scheme manager    Comment: [employer]  Tobacco Use   Smoking status: Every Day    Packs/day: 0.50    Years: 5.00    Additional pack years: 0.00    Total pack years: 2.50    Types: Cigarettes   Smokeless tobacco: Never  Vaping Use   Vaping Use: Never used  Substance and Sexual Activity   Alcohol use: Yes    Comment: rarely in social setting; maybe once per month   Drug use: Yes    Types: Marijuana   Sexual activity: Yes  Birth control/protection: Condom  Other Topics Concern   Not on file  Social History Narrative   Lives with her child, not currently.    Social Determinants of Health   Financial Resource Strain: Low Risk  (12/22/2022)   Overall Financial Resource Strain (CARDIA)    Difficulty of Paying Living Expenses: Not very hard  Food Insecurity: No Food Insecurity (12/22/2022)   Hunger Vital Sign    Worried About Running Out of Food in the Last Year: Never true    Ran Out of Food in the Last Year: Never true  Transportation Needs: No Transportation Needs (12/22/2022)   PRAPARE - Hydrologist (Medical): No    Lack of Transportation (Non-Medical): No  Recent Concern: Transportation Needs - Unmet Transportation Needs (10/18/2022)   PRAPARE -  Transportation    Lack of Transportation (Medical): Yes    Lack of Transportation (Non-Medical): Yes  Physical Activity: Insufficiently Active (12/22/2022)   Exercise Vital Sign    Days of Exercise per Week: 4 days    Minutes of Exercise per Session: 30 min  Stress: Stress Concern Present (12/22/2022)   [place]    Feeling of Stress : To some extent  Social Connections: Socially Isolated (12/22/2022)   Social Connection and Isolation Panel [NHANES]    Frequency of Communication with Friends and Family: More than three times a week    Frequency of Social Gatherings with Friends and Family: Once a week    Attends Religious Services: Never    Marine scientist or Organizations: No    Attends Archivist Meetings: Never    Marital Status: Never married  Intimate Partner Violence: Not At Risk (10/18/2022)   Humiliation, Afraid, Rape, and Kick questionnaire    Fear of Current or Ex-Partner: No    Emotionally Abused: No    Physically Abused: No    Sexually Abused: No    Outpatient Medications Prior to Visit  Medication Sig Dispense Refill   amLODipine (NORVASC) 10 MG tablet Take 1 tablet (10 mg total) by mouth daily. 90 tablet 1   cetirizine (ZYRTEC) 10 MG tablet Take 1 tablet (10 mg total) by mouth daily. 30 tablet 5   losartan-hydrochlorothiazide (HYZAAR) 50-12.5 MG tablet Take 1 tablet by mouth daily. 90 tablet 1   No facility-administered medications prior to visit.    Allergies  Allergen Reactions   Latex Hives, Itching and Other (See Comments)    Reaction:  Burning    ROS Review of Systems  Constitutional:  Negative for chills and fever.  Eyes:  Negative for visual disturbance.  Respiratory:  Negative for chest tightness and shortness of breath.   Neurological:  Negative for dizziness and headaches.      Objective:    Physical Exam HENT:     Head: Normocephalic.     Mouth/Throat:      Mouth: Mucous membranes are moist.  Cardiovascular:     Rate and Rhythm: Normal rate.     Heart sounds: Normal heart sounds.  Pulmonary:     Effort: Pulmonary effort is normal.     Breath sounds: Normal breath sounds.  Abdominal:     Tenderness: There is no abdominal tenderness. There is no right CVA tenderness or left CVA tenderness.  Musculoskeletal:     Comments: Negative straight leg test  Neurological:     Mental Status: She is alert.     BP (!) 144/102 (BP  Location: Left Arm)   Pulse 96   Ht 5\' 3"  (1.6 m)   Wt 196 lb (88.9 kg)   SpO2 98%   BMI 34.72 kg/m  Wt Readings from Last 3 Encounters:  12/22/22 196 lb (88.9 kg)  11/16/22 191 lb (86.6 kg)  10/31/22 187 lb 8 oz (85 kg)    Lab Results  Component Value Date   TSH 1.550 02/16/2022   Lab Results  Component Value Date   WBC 7.2 02/16/2022   HGB 13.6 02/16/2022   HCT 42.6 02/16/2022   MCV 84 02/16/2022   PLT 327 02/16/2022   Lab Results  Component Value Date   NA 141 12/08/2021   K 4.3 12/08/2021   CO2 18 (L) 12/08/2021   GLUCOSE 91 12/08/2021   BUN 9 12/08/2021   CREATININE 0.71 12/08/2021   BILITOT <0.2 12/08/2021   ALKPHOS 73 12/08/2021   AST 16 12/08/2021   ALT 19 12/08/2021   PROT 7.0 12/08/2021   ALBUMIN 4.3 12/08/2021   CALCIUM 9.2 12/08/2021   ANIONGAP 8 01/28/2020   EGFR 118 12/08/2021   Lab Results  Component Value Date   CHOL 176 02/16/2022   Lab Results  Component Value Date   HDL 37 (L) 02/16/2022   Lab Results  Component Value Date   LDLCALC 124 (H) 02/16/2022   Lab Results  Component Value Date   TRIG 78 02/16/2022   Lab Results  Component Value Date   CHOLHDL 4.8 (H) 02/16/2022   Lab Results  Component Value Date   HGBA1C 5.3 02/16/2022      Assessment & Plan:  Essential hypertension Assessment & Plan: Uncontrolled Encouraged to take losartan-hydrochlorothiazide 50-12.5 mg first and take amlodipine 2 to 3 hours afterwards Encourage smoking  cessation Low-sodium diet with increased physical activity encourage We will follow-up on BP in 2 weeks BP Readings from Last 3 Encounters:  12/22/22 (!) 144/102  11/16/22 132/86  10/31/22 (!) 141/98      Tobacco use Assessment & Plan: Smokes about 1 pack/2day  Asked about quitting: confirms that she currently smokes cigarettes Advise to quit smoking: Educated about QUITTING to reduce the risk of cancer, cardio and cerebrovascular disease. Assess willingness: Unwilling to quit at this time, but is working on cutting back. Assist with counseling and pharmacotherapy: Counseled for 5 minutes and literature provided. Arrange for follow up: follow up in 3 months and continue to offer help.    Left hip pain Assessment & Plan: Encouraged to take over-the-counter analgesic as needed No pain with palpation of the left and right hip Range of motion intact No evidence of inflammation Low suspicion for fracture and avascular necrosis No fever, weight loss, or reports of prolonged corticosteroid use Will continue to monitor   Pelvic pain Assessment & Plan: Transvaginal ultrasound ordered  Orders: -     US PELVIC COMPLETE WITH TRANSVAGINAL    Follow-up: Return in about 2 weeks (around 01/05/2023) for BP.   Alvira Monday, FNP

## 2022-12-22 NOTE — Patient Instructions (Addendum)
I appreciate the opportunity to provide care to you today!    Follow up:  2 weeks for BP  Blood Pressure Please take losartan hydrochlorothiazide 50-12.5 mg first and amlodipine 10 mg 2 to 3 hours later I recommend low-sodium diet with increased physical activity Please check your blood pressure at home and bring your ambulatory readings with you at your next appointment With your blood pressure to be less than 140/90 If you have blood pressure readings of 180/120 on 2 occurrences please report to the ED  Pelvic pain Please stop by [facility] to get a pelvic ultrasound to assess for any masses, cysts or fibroids     Please continue to a heart-healthy diet and increase your physical activities. Try to exercise for 9mins at least five days a week.      It was a pleasure to see you and I look forward to continuing to work together on your health and well-being. Please do not hesitate to call the office if you need care or have questions about your care.   Have a wonderful day and week. With Gratitude, Alvira Monday MSN, FNP-BC

## 2022-12-23 ENCOUNTER — Telehealth: Payer: Self-pay | Admitting: Family Medicine

## 2022-12-23 DIAGNOSIS — M25552 Pain in left hip: Secondary | ICD-10-CM | POA: Insufficient documentation

## 2022-12-23 DIAGNOSIS — Z720 Tobacco use: Secondary | ICD-10-CM | POA: Insufficient documentation

## 2022-12-23 DIAGNOSIS — R102 Pelvic and perineal pain: Secondary | ICD-10-CM | POA: Insufficient documentation

## 2022-12-23 NOTE — Assessment & Plan Note (Signed)
Smokes about 1 pack/2day  Asked about quitting: confirms that she currently smokes cigarettes Advise to quit smoking: Educated about QUITTING to reduce the risk of cancer, cardio and cerebrovascular disease. Assess willingness: Unwilling to quit at this time, but is working on cutting back. Assist with counseling and pharmacotherapy: Counseled for 5 minutes and literature provided. Arrange for follow up: follow up in 3 months and continue to offer help.

## 2022-12-23 NOTE — Assessment & Plan Note (Signed)
Transvaginal ultrasound ordered  

## 2022-12-23 NOTE — Assessment & Plan Note (Signed)
Encouraged to take over-the-counter analgesic as needed No pain with palpation of the left and right hip Range of motion intact No evidence of inflammation Low suspicion for fracture and avascular necrosis No fever, weight loss, or reports of prolonged corticosteroid use Will continue to monitor

## 2022-12-23 NOTE — Assessment & Plan Note (Signed)
Uncontrolled Encouraged to take losartan-hydrochlorothiazide 50-12.5 mg first and take amlodipine 2 to 3 hours afterwards Encourage smoking cessation Low-sodium diet with increased physical activity encourage We will follow-up on BP in 2 weeks BP Readings from Last 3 Encounters:  12/22/22 (!) 144/102  11/16/22 132/86  10/31/22 (!) 141/98

## 2022-12-23 NOTE — Telephone Encounter (Signed)
FYI Patient called after hours left voicemail still was having high blood pressure and terrible headaches. Patient woke up this morning does have a headache.

## 2022-12-23 NOTE — Telephone Encounter (Signed)
Please encourage the patient  to go to the ED for BP greater than 180/120 on two occurrences

## 2022-12-27 NOTE — Telephone Encounter (Signed)
Pt reports high readings over the weekend, jaw was hurting. Headaches have stopped and pt reports readings have also improved. Last reading was 103/70.

## 2023-01-03 ENCOUNTER — Ambulatory Visit (HOSPITAL_COMMUNITY)
Admission: RE | Admit: 2023-01-03 | Discharge: 2023-01-03 | Disposition: A | Discharge status: Home / Self Care | Payer: Medicaid Other | Source: Ambulatory Visit | Attending: Family Medicine | Admitting: Family Medicine

## 2023-01-03 DIAGNOSIS — R102 Pelvic and perineal pain: Secondary | ICD-10-CM | POA: Diagnosis present

## 2023-01-04 ENCOUNTER — Encounter: Payer: Self-pay | Admitting: Family Medicine

## 2023-01-04 ENCOUNTER — Ambulatory Visit: Payer: Medicaid Other | Admitting: Family Medicine

## 2023-01-04 ENCOUNTER — Other Ambulatory Visit: Payer: Self-pay | Admitting: Family Medicine

## 2023-01-04 VITALS — BP 124/87 | HR 107 | Ht 63.5 in | Wt 189.0 lb

## 2023-01-04 DIAGNOSIS — I10 Essential (primary) hypertension: Secondary | ICD-10-CM | POA: Diagnosis not present

## 2023-01-04 DIAGNOSIS — E559 Vitamin D deficiency, unspecified: Secondary | ICD-10-CM

## 2023-01-04 MED ORDER — VITAMIN D (ERGOCALCIFEROL) 1.25 MG (50000 UNIT) PO CAPS: 50000.0000 [IU] | ORAL_CAPSULE | ORAL | 1 refills | Status: DC

## 2023-01-04 NOTE — Progress Notes (Signed)
Established Patient Office Visit  Subjective:  Patient ID: Bethany Gomez, female    DOB: [birth year]  Age: 32 y.o. MRN: [id number]  CC:  Chief Complaint  Patient presents with   Follow-up    2 week f/u, pt reports doing well. Pt reports feeling in leg still there on left side causes pain.     HPI Bethany Gomez is a 32 y.o. female presents for hypertension f/u. For the details of today's visit, please refer to the assessment and plan.     Past Medical History:  Diagnosis Date   Headaches, cluster    History of gestational hypertension 02/09/2017   History of prior pregnancy with IUGR newborn 03/20/2017   3lb14oz @ 37wks      Hypertension    Mono exposure    Premenstrual dysphoric disorder    UTI (lower urinary tract infection)     Past Surgical History:  Procedure Laterality Date   NO PAST SURGERIES      Family History  Problem Relation Age of Onset   Diabetes Mother    Fibromyalgia Mother    Hypertension Mother    Congestive Heart Failure Father    Narcolepsy Father    Aneurysm Father    Congestive Heart Failure Paternal Uncle    Diabetes Maternal Grandmother    Congestive Heart Failure Maternal Grandmother    Diabetes Maternal Grandfather    Congestive Heart Failure Maternal Grandfather    Diabetes Paternal Grandmother    Diabetes Paternal Grandfather    Congestive Heart Failure Paternal Grandfather    Colon cancer Neg Hx    Breast cancer Neg Hx     Social History   Socioeconomic History   Marital status: Single    Spouse name: Not on file   Number of children: 1   Years of education: Not on file   Highest education level: Some college, no degree  Occupational History   Occupation: Scientist, product/process developmentMail order pharmacist    Comment: [employer]  Tobacco Use   Smoking status: Every Day    Packs/day: 0.50    Years: 5.00    Additional pack years: 0.00    Total pack years: 2.50    Types: Cigarettes   Smokeless tobacco: Never  Vaping Use   Vaping Use: Never used   Substance and Sexual Activity   Alcohol use: Yes    Comment: rarely in social setting; maybe once per month   Drug use: Yes    Types: Marijuana   Sexual activity: Yes    Birth control/protection: Condom  Other Topics Concern   Not on file  Social History Narrative   Lives with her child, not currently.    Social Determinants of Health   Financial Resource Strain: Low Risk  (12/22/2022)   Overall Financial Resource Strain (CARDIA)    Difficulty of Paying Living Expenses: Not very hard  Food Insecurity: No Food Insecurity (12/22/2022)   Hunger Vital Sign    Worried About Running Out of Food in the Last Year: Never true    Ran Out of Food in the Last Year: Never true  Transportation Needs: No Transportation Needs (12/22/2022)   PRAPARE - Administrator, Civil ServiceTransportation    Lack of Transportation (Medical): No    Lack of Transportation (Non-Medical): No  Recent Concern: Transportation Needs - Unmet Transportation Needs (10/18/2022)   PRAPARE - Transportation    Lack of Transportation (Medical): Yes    Lack of Transportation (Non-Medical): Yes  Physical Activity: Insufficiently Active (12/22/2022)   Exercise  Vital Sign    Days of Exercise per Week: 4 days    Minutes of Exercise per Session: 30 min  Stress: Stress Concern Present (12/22/2022)   [employer] of Occupational Health - Occupational Stress Questionnaire    Feeling of Stress : To some extent  Social Connections: Socially Isolated (12/22/2022)   Social Connection and Isolation Panel [NHANES]    Frequency of Communication with Friends and Family: More than three times a week    Frequency of Social Gatherings with Friends and Family: Once a week    Attends Religious Services: Never    Database administrator or Organizations: No    Attends Banker Meetings: Never    Marital Status: Never married  Intimate Partner Violence: Not At Risk (10/18/2022)   Humiliation, Afraid, Rape, and Kick questionnaire    Fear of Current or  Ex-Partner: No    Emotionally Abused: No    Physically Abused: No    Sexually Abused: No    Outpatient Medications Prior to Visit  Medication Sig Dispense Refill   amLODipine (NORVASC) 10 MG tablet Take 1 tablet (10 mg total) by mouth daily. 90 tablet 1   cetirizine (ZYRTEC) 10 MG tablet Take 1 tablet (10 mg total) by mouth daily. 30 tablet 5   losartan-hydrochlorothiazide (HYZAAR) 50-12.5 MG tablet Take 1 tablet by mouth daily. 90 tablet 1   No facility-administered medications prior to visit.    Allergies  Allergen Reactions   Latex Hives, Itching and Other (See Comments)    Reaction:  Burning    ROS Review of Systems  Constitutional:  Negative for chills and fever.  Eyes:  Negative for visual disturbance.  Respiratory:  Negative for chest tightness and shortness of breath.   Neurological:  Negative for dizziness and headaches.      Objective:    Physical Exam HENT:     Head: Normocephalic.     Mouth/Throat:     Mouth: Mucous membranes are moist.  Cardiovascular:     Rate and Rhythm: Normal rate.     Heart sounds: Normal heart sounds.  Pulmonary:     Effort: Pulmonary effort is normal.     Breath sounds: Normal breath sounds.  Neurological:     Mental Status: She is alert.     BP 124/87   Pulse (!) 107   Ht 5' 3.5" (1.613 m)   Wt 189 lb (85.7 kg)   SpO2 97%   BMI 32.95 kg/m  Wt Readings from Last 3 Encounters:  01/04/23 189 lb (85.7 kg)  12/22/22 196 lb (88.9 kg)  11/16/22 191 lb (86.6 kg)    Lab Results  Component Value Date   TSH 1.550 02/16/2022   Lab Results  Component Value Date   WBC 7.2 02/16/2022   HGB 13.6 02/16/2022   HCT 42.6 02/16/2022   MCV 84 02/16/2022   PLT 327 02/16/2022   Lab Results  Component Value Date   NA 141 12/08/2021   K 4.3 12/08/2021   CO2 18 (L) 12/08/2021   GLUCOSE 91 12/08/2021   BUN 9 12/08/2021   CREATININE 0.71 12/08/2021   BILITOT <0.2 12/08/2021   ALKPHOS 73 12/08/2021   AST 16 12/08/2021   ALT  19 12/08/2021   PROT 7.0 12/08/2021   ALBUMIN 4.3 12/08/2021   CALCIUM 9.2 12/08/2021   ANIONGAP 8 01/28/2020   EGFR 118 12/08/2021   Lab Results  Component Value Date   CHOL 176 02/16/2022   Lab Results  Component Value Date   HDL 37 (L) 02/16/2022   Lab Results  Component Value Date   LDLCALC 124 (H) 02/16/2022   Lab Results  Component Value Date   TRIG 78 02/16/2022   Lab Results  Component Value Date   CHOLHDL 4.8 (H) 02/16/2022   Lab Results  Component Value Date   HGBA1C 5.3 02/16/2022      Assessment & Plan:  Essential hypertension Assessment & Plan: Controlled Asymptomatic today in the clinic Low-sodium diet with increased physical activity encourage BP Readings from Last 3 Encounters:  01/04/23 124/87  12/22/22 (!) 144/102  11/16/22 132/86        Follow-up: Return in about 3 months (around 04/05/2023).   Gilmore Laroche, FNP

## 2023-01-04 NOTE — Assessment & Plan Note (Signed)
Controlled Asymptomatic today in the clinic Low-sodium diet with increased physical activity encourage BP Readings from Last 3 Encounters:  01/04/23 124/87  12/22/22 (!) 144/102  11/16/22 132/86

## 2023-01-04 NOTE — Progress Notes (Unsigned)
Established Patient Office Visit  Subjective:  Patient ID: Bethany Gomez, female    DOB: 1991/06/17  Age: 32 y.o. MRN: [id number]  CC: No chief complaint on file.   HPI Bethany Gomez is a 32 y.o. female with past medical history of *** presents for f/u of *** chronic medical conditions.  Past Medical History:  Diagnosis Date   Headaches, cluster    History of gestational hypertension 02/09/2017   History of prior pregnancy with IUGR newborn 03/20/2017   3lb14oz @ 37wks      Hypertension    Mono exposure    Premenstrual dysphoric disorder    UTI (lower urinary tract infection)     Past Surgical History:  Procedure Laterality Date   NO PAST SURGERIES      Family History  Problem Relation Age of Onset   Diabetes Mother    Fibromyalgia Mother    Hypertension Mother    Congestive Heart Failure Father    Narcolepsy Father    Aneurysm Father    Congestive Heart Failure Paternal Uncle    Diabetes Maternal Grandmother    Congestive Heart Failure Maternal Grandmother    Diabetes Maternal Grandfather    Congestive Heart Failure Maternal Grandfather    Diabetes Paternal Grandmother    Diabetes Paternal Grandfather    Congestive Heart Failure Paternal Grandfather    Colon cancer Neg Hx    Breast cancer Neg Hx     Social History   Socioeconomic History   Marital status: Single    Spouse name: Not on file   Number of children: 1   Years of education: Not on file   Highest education level: Some college, no degree  Occupational History   Occupation: Scientist, product/process development    Comment: [employer]  Tobacco Use   Smoking status: Every Day    Packs/day: 0.50    Years: 5.00    Additional pack years: 0.00    Total pack years: 2.50    Types: Cigarettes   Smokeless tobacco: Never  Vaping Use   Vaping Use: Never used  Substance and Sexual Activity   Alcohol use: Yes    Comment: rarely in social setting; maybe once per month   Drug use: Yes    Types: Marijuana   Sexual  activity: Yes    Birth control/protection: Condom  Other Topics Concern   Not on file  Social History Narrative   Lives with her child, not currently.    Social Determinants of Health   Financial Resource Strain: Low Risk  (12/22/2022)   Overall Financial Resource Strain (CARDIA)    Difficulty of Paying Living Expenses: Not very hard  Food Insecurity: No Food Insecurity (12/22/2022)   Hunger Vital Sign    Worried About Running Out of Food in the Last Year: Never true    Ran Out of Food in the Last Year: Never true  Transportation Needs: No Transportation Needs (12/22/2022)   PRAPARE - Administrator, Civil Service (Medical): No    Lack of Transportation (Non-Medical): No  Recent Concern: Transportation Needs - Unmet Transportation Needs (10/18/2022)   PRAPARE - Transportation    Lack of Transportation (Medical): Yes    Lack of Transportation (Non-Medical): Yes  Physical Activity: Insufficiently Active (12/22/2022)   Exercise Vital Sign    Days of Exercise per Week: 4 days    Minutes of Exercise per Session: 30 min  Stress: Stress Concern Present (12/22/2022)   [employer] of Occupational Health -  Occupational Stress Questionnaire    Feeling of Stress : To some extent  Social Connections: Socially Isolated (12/22/2022)   Social Connection and Isolation Panel [NHANES]    Frequency of Communication with Friends and Family: More than three times a week    Frequency of Social Gatherings with Friends and Family: Once a week    Attends Religious Services: Never    Database administrator or Organizations: No    Attends Banker Meetings: Never    Marital Status: Never married  Intimate Partner Violence: Not At Risk (10/18/2022)   Humiliation, Afraid, Rape, and Kick questionnaire    Fear of Current or Ex-Partner: No    Emotionally Abused: No    Physically Abused: No    Sexually Abused: No    Outpatient Medications Prior to Visit  Medication Sig Dispense  Refill   amLODipine (NORVASC) 10 MG tablet Take 1 tablet (10 mg total) by mouth daily. 90 tablet 1   cetirizine (ZYRTEC) 10 MG tablet Take 1 tablet (10 mg total) by mouth daily. 30 tablet 5   losartan-hydrochlorothiazide (HYZAAR) 50-12.5 MG tablet Take 1 tablet by mouth daily. 90 tablet 1   No facility-administered medications prior to visit.    Allergies  Allergen Reactions   Latex Hives, Itching and Other (See Comments)    Reaction:  Burning    ROS Review of Systems    Objective:    Physical Exam  There were no vitals taken for this visit. Wt Readings from Last 3 Encounters:  01/04/23 189 lb (85.7 kg)  12/22/22 196 lb (88.9 kg)  11/16/22 191 lb (86.6 kg)    Lab Results  Component Value Date   TSH 1.550 02/16/2022   Lab Results  Component Value Date   WBC 7.2 02/16/2022   HGB 13.6 02/16/2022   HCT 42.6 02/16/2022   MCV 84 02/16/2022   PLT 327 02/16/2022   Lab Results  Component Value Date   NA 141 12/08/2021   K 4.3 12/08/2021   CO2 18 (L) 12/08/2021   GLUCOSE 91 12/08/2021   BUN 9 12/08/2021   CREATININE 0.71 12/08/2021   BILITOT <0.2 12/08/2021   ALKPHOS 73 12/08/2021   AST 16 12/08/2021   ALT 19 12/08/2021   PROT 7.0 12/08/2021   ALBUMIN 4.3 12/08/2021   CALCIUM 9.2 12/08/2021   ANIONGAP 8 01/28/2020   EGFR 118 12/08/2021   Lab Results  Component Value Date   CHOL 176 02/16/2022   Lab Results  Component Value Date   HDL 37 (L) 02/16/2022   Lab Results  Component Value Date   LDLCALC 124 (H) 02/16/2022   Lab Results  Component Value Date   TRIG 78 02/16/2022   Lab Results  Component Value Date   CHOLHDL 4.8 (H) 02/16/2022   Lab Results  Component Value Date   HGBA1C 5.3 02/16/2022      Assessment & Plan:  Vitamin D deficiency -     Vitamin D (Ergocalciferol); Take 1 capsule (50,000 Units total) by mouth every 7 (seven) days.  Dispense: 20 capsule; Refill: 1    Follow-up: No follow-ups on file.   Gilmore Laroche, FNP

## 2023-01-04 NOTE — Progress Notes (Signed)
No masses or fibroids were seen on your pelvic ultrasound.

## 2023-01-04 NOTE — Patient Instructions (Signed)
I appreciate the opportunity to provide care to you today!    Follow up:  3 months   Please continue to a heart-healthy diet and increase your physical activities. Try to exercise for 30mins at least five days a week.      It was a pleasure to see you and I look forward to continuing to work together on your health and well-being. Please do not hesitate to call the office if you need care or have questions about your care.   Have a wonderful day and week. With Gratitude, Audelia Knape MSN, FNP-BC  

## 2023-01-05 ENCOUNTER — Ambulatory Visit: Payer: Medicaid Other | Admitting: Family Medicine

## 2023-01-13 LAB — CBC WITH DIFFERENTIAL/PLATELET
Basophils Absolute: 0.1 10*3/uL (ref 0.0–0.2)
Basos: 1 %
EOS (ABSOLUTE): 0.2 10*3/uL (ref 0.0–0.4)
Eos: 3 %
Hematocrit: 40.3 % (ref 34.0–46.6)
Hemoglobin: 12.6 g/dL (ref 11.1–15.9)
Immature Grans (Abs): 0 10*3/uL (ref 0.0–0.1)
Immature Granulocytes: 0 %
Lymphocytes Absolute: 2.6 10*3/uL (ref 0.7–3.1)
Lymphs: 38 %
MCH: 26.5 pg — ABNORMAL LOW (ref 26.6–33.0)
MCHC: 31.3 g/dL — ABNORMAL LOW (ref 31.5–35.7)
MCV: 85 fL (ref 79–97)
Monocytes Absolute: 0.5 10*3/uL (ref 0.1–0.9)
Monocytes: 7 %
Neutrophils Absolute: 3.5 10*3/uL (ref 1.4–7.0)
Neutrophils: 51 %
Platelets: 433 10*3/uL (ref 150–450)
RBC: 4.75 x10E6/uL (ref 3.77–5.28)
RDW: 12.9 % (ref 11.7–15.4)
WBC: 6.9 10*3/uL (ref 3.4–10.8)

## 2023-01-13 LAB — CMP14+EGFR
ALT: 21 IU/L (ref 0–32)
AST: 16 IU/L (ref 0–40)
Albumin/Globulin Ratio: 1.4 (ref 1.2–2.2)
Albumin: 4.3 g/dL (ref 4.0–5.0)
Alkaline Phosphatase: 61 IU/L (ref 44–121)
BUN/Creatinine Ratio: 19 (ref 9–23)
BUN: 16 mg/dL (ref 6–20)
Bilirubin Total: <0.2 mg/dL (ref 0.0–1.2)
CO2: 19 mmol/L — ABNORMAL LOW (ref 20–29)
Calcium: 9.9 mg/dL (ref 8.7–10.2)
Chloride: 106 mmol/L (ref 96–106)
Creatinine, Ser: 0.86 mg/dL (ref 0.57–1.00)
Globulin, Total: 3 g/dL (ref 1.5–4.5)
Glucose: 103 mg/dL — ABNORMAL HIGH (ref 70–99)
Potassium: 4.1 mmol/L (ref 3.5–5.2)
Sodium: 142 mmol/L (ref 134–144)
Total Protein: 7.3 g/dL (ref 6.0–8.5)
eGFR: 93 mL/min/{1.73_m2} (ref >59)

## 2023-01-13 LAB — VITAMIN D 25 HYDROXY (VIT D DEFICIENCY, FRACTURES): Vit D, 25-Hydroxy: 10.2 ng/mL — ABNORMAL LOW (ref 30.0–100.0)

## 2023-01-13 LAB — LIPID PANEL
Chol/HDL Ratio: 4.6 ratio — ABNORMAL HIGH (ref 0.0–4.4)
Cholesterol, Total: 170 mg/dL (ref 100–199)
HDL: 37 mg/dL — ABNORMAL LOW (ref >39)
LDL Chol Calc (NIH): 123 mg/dL — ABNORMAL HIGH (ref 0–99)
Triglycerides: 50 mg/dL (ref 0–149)
VLDL Cholesterol Cal: 10 mg/dL (ref 5–40)

## 2023-01-13 LAB — TSH+FREE T4
Free T4: 1.18 ng/dL (ref 0.82–1.77)
TSH: 0.806 u[IU]/mL (ref 0.450–4.500)

## 2023-01-13 LAB — HEMOGLOBIN A1C
Est. average glucose Bld gHb Est-mCnc: 114 mg/dL
Hgb A1c MFr Bld: 5.6 % (ref 4.8–5.6)

## 2023-01-16 ENCOUNTER — Other Ambulatory Visit: Payer: Self-pay | Admitting: Family Medicine

## 2023-01-16 DIAGNOSIS — E559 Vitamin D deficiency, unspecified: Secondary | ICD-10-CM

## 2023-02-07 ENCOUNTER — Encounter: Payer: Self-pay | Admitting: Family Medicine

## 2023-02-07 ENCOUNTER — Ambulatory Visit: Payer: Medicaid Other | Admitting: Family Medicine

## 2023-02-07 VITALS — BP 105/74 | HR 89 | Wt 192.0 lb

## 2023-02-07 DIAGNOSIS — R058 Other specified cough: Secondary | ICD-10-CM | POA: Diagnosis not present

## 2023-02-07 DIAGNOSIS — E669 Obesity, unspecified: Secondary | ICD-10-CM | POA: Diagnosis not present

## 2023-02-07 DIAGNOSIS — I10 Essential (primary) hypertension: Secondary | ICD-10-CM | POA: Diagnosis not present

## 2023-02-07 DIAGNOSIS — E559 Vitamin D deficiency, unspecified: Secondary | ICD-10-CM | POA: Diagnosis not present

## 2023-02-07 MED ORDER — CETIRIZINE HCL 10 MG PO TABS: 10.0000 mg | ORAL_TABLET | Freq: Every day | ORAL | 5 refills | Status: DC

## 2023-02-07 NOTE — Assessment & Plan Note (Addendum)
Complains of fluctuation in her weight Admits to minimal physical activity Encouraged heart healthy diet with increased physical activity My weight management plan reviewed Patient verbalized understanding Wt Readings from Last 3 Encounters:  02/07/23 192 lb 0.6 oz (87.1 kg)  01/04/23 189 lb (85.7 kg)  12/22/22 196 lb (88.9 kg)

## 2023-02-07 NOTE — Patient Instructions (Signed)
I appreciate the opportunity to provide care to you today!    Follow up:  4 months  Labs: next visit  Please pick up your refills at the pharmacy  Healthy tips to weight loss Eat three meals per day at times discussed. Cut out all diet bevergages and drink only water Eat whole food plant based meals Cut out junk food, fast food and processed foods Exercise 150 minutes a week Lose 1-2 lbs per week. Keep a food journal Choose foods that grow in a garden or in a fruit orchard and protein of animals with fins or feathers.  Lifestyle Medicine - Whole Food, Plant Predominant Nutrition is highly recommended: Eat Plenty of vegetables, Mushrooms, fruits, Legumes, Whole Grains, Nuts, seeds in lieu of processed meats, processed snacks/pastries red meat, poultry, eggs.  -It is better to avoid simple carbohydrates including: Cakes, Sweet Desserts, Ice Cream, Soda (diet and regular), Sweet Tea, Candies, Chips, Cookies, Store Bought Juices, Alcohol in Excess of  1-2 drinks a day, Lemonade,  Artificial Sweeteners, Doughnuts, Coffee Creamers, "Sugar-free" Products, etc, etc.  This is not a complete list..... Exercise: If you are able: 30 -60 minutes a day ,4 days a week, or 150 minutes a week.  The longer the better.  Combine stretch, strength, and aerobic activities.  If you were told in the past that you have high risk for cardiovascular diseases, you may seek evaluation by your heart doctor prior to initiating moderate to intense exercise programs.      Please continue to a heart-healthy diet and increase your physical activities. Try to exercise for at least five days a week.      It was a pleasure to see you and I look forward to continuing to work together on your health and well-being. Please do not hesitate to call the office if you need care or have questions about your care.   Have a wonderful day and week. With Gratitude, Gilmore Laroche MSN, FNP-BC

## 2023-02-07 NOTE — Assessment & Plan Note (Signed)
Refill Zyrtec 10mg daily. 

## 2023-02-07 NOTE — Assessment & Plan Note (Addendum)
Controlled She takes losartan-hydrochlorothiazide 50-12.5 mg and amlodipine 10 mg daily The patient is asymptomatic Dash diet encourage with increased physical activity BP Readings from Last 3 Encounters:  02/07/23 105/74  01/04/23 124/87  12/22/22 (!) 144/102

## 2023-02-07 NOTE — Progress Notes (Signed)
Established Patient Office Visit  Subjective:  Patient ID: Bethany Gomez, female    DOB: 06-06-1991  Age: 32 y.o. MRN: [id number]  CC:  Chief Complaint  Patient presents with   Chronic Care Management    3 month f/u.     HPI Bethany Gomez is a 32 y.o. female with past medical history of hypertension, allergic rhinitis, and vitamin D deficiency presents for f/u of  chronic medical conditions. For the details of today's visit, please refer to the assessment and plan.     Past Medical History:  Diagnosis Date   Headaches, cluster    History of gestational hypertension 02/09/2017   History of prior pregnancy with IUGR newborn 03/20/2017   3lb14oz @ 37wks      Hypertension    Mono exposure    Premenstrual dysphoric disorder    UTI (lower urinary tract infection)     Past Surgical History:  Procedure Laterality Date   NO PAST SURGERIES      Family History  Problem Relation Age of Onset   Diabetes Mother    Fibromyalgia Mother    Hypertension Mother    Congestive Heart Failure Father    Narcolepsy Father    Aneurysm Father    Congestive Heart Failure Paternal Uncle    Diabetes Maternal Grandmother    Congestive Heart Failure Maternal Grandmother    Diabetes Maternal Grandfather    Congestive Heart Failure Maternal Grandfather    Diabetes Paternal Grandmother    Diabetes Paternal Grandfather    Congestive Heart Failure Paternal Grandfather    Colon cancer Neg Hx    Breast cancer Neg Hx     Social History   Socioeconomic History   Marital status: Single    Spouse name: Not on file   Number of children: 1   Years of education: Not on file   Highest education level: Some college, no degree  Occupational History   Occupation: Scientist, product/process development    Comment: [employer]  Tobacco Use   Smoking status: Every Day    Packs/day: 0.50    Years: 5.00    Additional pack years: 0.00    Total pack years: 2.50    Types: Cigarettes   Smokeless tobacco: Never  Vaping Use    Vaping Use: Never used  Substance and Sexual Activity   Alcohol use: Yes    Comment: rarely in social setting; maybe once per month   Drug use: Yes    Types: Marijuana   Sexual activity: Yes    Birth control/protection: Condom  Other Topics Concern   Not on file  Social History Narrative   Lives with her child, not currently.    Social Determinants of Health   Financial Resource Strain: Low Risk  (12/22/2022)   Overall Financial Resource Strain (CARDIA)    Difficulty of Paying Living Expenses: Not very hard  Food Insecurity: No Food Insecurity (12/22/2022)   Hunger Vital Sign    Worried About Running Out of Food in the Last Year: Never true    Ran Out of Food in the Last Year: Never true  Transportation Needs: No Transportation Needs (12/22/2022)   PRAPARE - Administrator, Civil Service (Medical): No    Lack of Transportation (Non-Medical): No  Recent Concern: Transportation Needs - Unmet Transportation Needs (10/18/2022)   PRAPARE - Transportation    Lack of Transportation (Medical): Yes    Lack of Transportation (Non-Medical): Yes  Physical Activity: Insufficiently Active (12/22/2022)  Exercise Vital Sign    Days of Exercise per Week: 4 days    Minutes of Exercise per Session: 30 min  Stress: Stress Concern Present (12/22/2022)   [employer] of Occupational Health - Occupational Stress Questionnaire    Feeling of Stress : To some extent  Social Connections: Socially Isolated (12/22/2022)   Social Connection and Isolation Panel [NHANES]    Frequency of Communication with Friends and Family: More than three times a week    Frequency of Social Gatherings with Friends and Family: Once a week    Attends Religious Services: Never    Database administrator or Organizations: No    Attends Banker Meetings: Never    Marital Status: Never married  Intimate Partner Violence: Not At Risk (10/18/2022)   Humiliation, Afraid, Rape, and Kick questionnaire     Fear of Current or Ex-Partner: No    Emotionally Abused: No    Physically Abused: No    Sexually Abused: No    Outpatient Medications Prior to Visit  Medication Sig Dispense Refill   amLODipine (NORVASC) 10 MG tablet Take 1 tablet (10 mg total) by mouth daily. 90 tablet 1   losartan-hydrochlorothiazide (HYZAAR) 50-12.5 MG tablet Take 1 tablet by mouth daily. 90 tablet 1   Vitamin D, Ergocalciferol, (DRISDOL) 1.25 MG (50000 UNIT) CAPS capsule Take 1 capsule (50,000 Units total) by mouth every 7 (seven) days. 20 capsule 1   cetirizine (ZYRTEC) 10 MG tablet Take 1 tablet (10 mg total) by mouth daily. 30 tablet 5   No facility-administered medications prior to visit.    Allergies  Allergen Reactions   Latex Hives, Itching and Other (See Comments)    Reaction:  Burning    ROS Review of Systems  Constitutional:  Negative for chills and fever.  Eyes:  Negative for visual disturbance.  Respiratory:  Negative for chest tightness and shortness of breath.   Neurological:  Negative for dizziness and headaches.      Objective:    Physical Exam HENT:     Head: Normocephalic.     Mouth/Throat:     Mouth: Mucous membranes are moist.  Cardiovascular:     Rate and Rhythm: Normal rate.     Heart sounds: Normal heart sounds.  Pulmonary:     Effort: Pulmonary effort is normal.     Breath sounds: Normal breath sounds.  Neurological:     Mental Status: She is alert.     BP 105/74   Pulse 89   Wt 192 lb 0.6 oz (87.1 kg)   SpO2 98%   BMI 33.48 kg/m  Wt Readings from Last 3 Encounters:  02/07/23 192 lb 0.6 oz (87.1 kg)  01/04/23 189 lb (85.7 kg)  12/22/22 196 lb (88.9 kg)    Lab Results  Component Value Date   TSH 0.806 01/12/2023   Lab Results  Component Value Date   WBC 6.9 01/12/2023   HGB 12.6 01/12/2023   HCT 40.3 01/12/2023   MCV 85 01/12/2023   PLT 433 01/12/2023   Lab Results  Component Value Date   NA 142 01/12/2023   K 4.1 01/12/2023   CO2 19 (L)  01/12/2023   GLUCOSE 103 (H) 01/12/2023   BUN 16 01/12/2023   CREATININE 0.86 01/12/2023   BILITOT <0.2 01/12/2023   ALKPHOS 61 01/12/2023   AST 16 01/12/2023   ALT 21 01/12/2023   PROT 7.3 01/12/2023   ALBUMIN 4.3 01/12/2023   CALCIUM 9.9 01/12/2023  ANIONGAP 8 01/28/2020   EGFR 93 01/12/2023   Lab Results  Component Value Date   CHOL 170 01/12/2023   Lab Results  Component Value Date   HDL 37 (L) 01/12/2023   Lab Results  Component Value Date   LDLCALC 123 (H) 01/12/2023   Lab Results  Component Value Date   TRIG 50 01/12/2023   Lab Results  Component Value Date   CHOLHDL 4.6 (H) 01/12/2023   Lab Results  Component Value Date   HGBA1C 5.6 01/12/2023      Assessment & Plan:  Essential hypertension Assessment & Plan: Controlled She takes losartan-hydrochlorothiazide 50-12.5 mg and amlodipine 10 mg daily The patient is asymptomatic Dash diet encourage with increased physical activity BP Readings from Last 3 Encounters:  02/07/23 105/74  01/04/23 124/87  12/22/22 (!) 144/102      Obesity (BMI 30-39.9) Assessment & Plan: Complains of fluctuation in her weight Admits to minimal physical activity Encouraged heart healthy diet with increased physical activity My weight management plan reviewed Patient verbalized understanding Wt Readings from Last 3 Encounters:  02/07/23 192 lb 0.6 oz (87.1 kg)  01/04/23 189 lb (85.7 kg)  12/22/22 196 lb (88.9 kg)      Vitamin D deficiency Assessment & Plan: Reports compliance with her weekly vitamin D supplement Encouraged to continue therapy   Allergic cough Assessment & Plan: Refill Zyrtec 10 mg daily  Orders: -     Cetirizine HCl; Take 1 tablet (10 mg total) by mouth daily.  Dispense: 30 tablet; Refill: 5    Follow-up: Return in about 4 months (around 06/10/2023).   Gilmore Laroche, FNP

## 2023-02-07 NOTE — Assessment & Plan Note (Signed)
Reports compliance with her weekly vitamin D supplement Encouraged to continue therapy

## 2023-02-15 ENCOUNTER — Ambulatory Visit: Payer: Medicaid Other | Admitting: Family Medicine

## 2023-04-05 ENCOUNTER — Ambulatory Visit: Payer: Self-pay | Admitting: Family Medicine

## 2023-06-12 ENCOUNTER — Ambulatory Visit: Payer: Medicaid Other | Admitting: Family Medicine

## 2023-07-10 ENCOUNTER — Encounter: Payer: Self-pay | Admitting: Family Medicine

## 2023-07-10 ENCOUNTER — Ambulatory Visit: Payer: Medicaid Other | Admitting: Family Medicine

## 2023-07-10 VITALS — BP 120/85 | HR 70 | Wt 188.1 lb

## 2023-07-10 DIAGNOSIS — K047 Periapical abscess without sinus: Secondary | ICD-10-CM

## 2023-07-10 DIAGNOSIS — B3731 Acute candidiasis of vulva and vagina: Secondary | ICD-10-CM | POA: Diagnosis not present

## 2023-07-10 MED ORDER — AMOXICILLIN-POT CLAVULANATE 875-125 MG PO TABS: 1.0000 | ORAL_TABLET | Freq: Two times a day (BID) | ORAL | 0 refills | Status: DC

## 2023-07-10 MED ORDER — FLUCONAZOLE 150 MG PO TABS: 150.0000 mg | ORAL_TABLET | Freq: Once | ORAL | 0 refills | Status: AC

## 2023-07-10 MED ORDER — AMOXICILLIN-POT CLAVULANATE 875-125 MG PO TABS
1.0000 | ORAL_TABLET | Freq: Two times a day (BID) | ORAL | 0 refills | Status: AC
Start: 2023-07-10 — End: 2023-07-17

## 2023-07-10 NOTE — Progress Notes (Signed)
Acute Office Visit  Subjective:    Patient ID: Bethany Gomez, female    DOB: 12-12-1990, 32 y.o.   MRN: [id number]  Chief Complaint  Patient presents with   Dental Pain    Pt reports dental pain from wisdom teeth, possible abscess, ongoing for a few days. Worsens at night. If antibiotic given also needs yeast medication prescribed.     HPI The patient is in today with reports of dental pain that worsens with chewing on the affected side, along with tenderness and swelling of the left cheek. No fever reported. Symptoms have been present for 4 days. She has an appointment with the dentist on August 09, 2023.      Past Medical History:  Diagnosis Date   Headaches, cluster    History of gestational hypertension 02/09/2017   History of prior pregnancy with IUGR newborn 03/20/2017   3lb14oz @ 37wks      Hypertension    Mono exposure    Premenstrual dysphoric disorder    UTI (lower urinary tract infection)     Past Surgical History:  Procedure Laterality Date   NO PAST SURGERIES      Family History  Problem Relation Age of Onset   Diabetes Mother    Fibromyalgia Mother    Hypertension Mother    Congestive Heart Failure Father    Narcolepsy Father    Aneurysm Father    Congestive Heart Failure Paternal Uncle    Diabetes Maternal Grandmother    Congestive Heart Failure Maternal Grandmother    Diabetes Maternal Grandfather    Congestive Heart Failure Maternal Grandfather    Diabetes Paternal Grandmother    Diabetes Paternal Grandfather    Congestive Heart Failure Paternal Grandfather    Colon cancer Neg Hx    Breast cancer Neg Hx     Social History   Socioeconomic History   Marital status: Single    Spouse name: Not on file   Number of children: 1   Years of education: Not on file   Highest education level: Some college, no degree  Occupational History   Occupation: Scientist, product/process development    Comment: [employer]  Tobacco Use   Smoking status: Every Day     Current packs/day: 0.50    Average packs/day: 0.5 packs/day for 5.0 years (2.5 ttl pk-yrs)    Types: Cigarettes   Smokeless tobacco: Never  Vaping Use   Vaping status: Never Used  Substance and Sexual Activity   Alcohol use: Yes    Comment: rarely in social setting; maybe once per month   Drug use: Yes    Types: Marijuana   Sexual activity: Yes    Birth control/protection: Condom  Other Topics Concern   Not on file  Social History Narrative   Lives with her child, not currently.    Social Determinants of Health   Financial Resource Strain: Low Risk  (12/22/2022)   Overall Financial Resource Strain (CARDIA)    Difficulty of Paying Living Expenses: Not very hard  Food Insecurity: No Food Insecurity (12/22/2022)   Hunger Vital Sign    Worried About Running Out of Food in the Last Year: Never true    Ran Out of Food in the Last Year: Never true  Transportation Needs: No Transportation Needs (12/22/2022)   PRAPARE - Administrator, Civil Service (Medical): No    Lack of Transportation (Non-Medical): No  Recent Concern: Transportation Needs - Unmet Transportation Needs (10/18/2022)   PRAPARE - Transportation  Lack of Transportation (Medical): Yes    Lack of Transportation (Non-Medical): Yes  Physical Activity: Insufficiently Active (12/22/2022)   Exercise Vital Sign    Days of Exercise per Week: 4 days    Minutes of Exercise per Session: 30 min  Stress: Stress Concern Present (12/22/2022)   [employer] of Occupational Health - Occupational Stress Questionnaire    Feeling of Stress : To some extent  Social Connections: Socially Isolated (12/22/2022)   Social Connection and Isolation Panel [NHANES]    Frequency of Communication with Friends and Family: More than three times a week    Frequency of Social Gatherings with Friends and Family: Once a week    Attends Religious Services: Never    Database administrator or Organizations: No    Attends Banker  Meetings: Never    Marital Status: Never married  Intimate Partner Violence: Not At Risk (10/18/2022)   Humiliation, Afraid, Rape, and Kick questionnaire    Fear of Current or Ex-Partner: No    Emotionally Abused: No    Physically Abused: No    Sexually Abused: No    Outpatient Medications Prior to Visit  Medication Sig Dispense Refill   amLODipine (NORVASC) 10 MG tablet Take 1 tablet (10 mg total) by mouth daily. 90 tablet 1   cetirizine (ZYRTEC) 10 MG tablet Take 1 tablet (10 mg total) by mouth daily. 30 tablet 5   losartan-hydrochlorothiazide (HYZAAR) 50-12.5 MG tablet Take 1 tablet by mouth daily. 90 tablet 1   Vitamin D, Ergocalciferol, (DRISDOL) 1.25 MG (50000 UNIT) CAPS capsule Take 1 capsule (50,000 Units total) by mouth every 7 (seven) days. 20 capsule 1   No facility-administered medications prior to visit.    Allergies  Allergen Reactions   Latex Hives, Itching and Other (See Comments)    Reaction:  Burning    Review of Systems  Constitutional:  Negative for chills and fever.  HENT:  Positive for dental problem.   Eyes:  Negative for visual disturbance.  Respiratory:  Negative for chest tightness and shortness of breath.   Neurological:  Negative for dizziness and headaches.       Objective:    Physical Exam HENT:     Head: Normocephalic.     Mouth/Throat:     Mouth: Mucous membranes are moist.     Dentition: Dental tenderness present.  Cardiovascular:     Rate and Rhythm: Normal rate.     Heart sounds: Normal heart sounds.  Pulmonary:     Effort: Pulmonary effort is normal.     Breath sounds: Normal breath sounds.  Neurological:     Mental Status: She is alert.     BP 120/85   Pulse 70   Wt 188 lb 1.9 oz (85.3 kg)   SpO2 97%   BMI 32.80 kg/m  Wt Readings from Last 3 Encounters:  07/10/23 188 lb 1.9 oz (85.3 kg)  02/07/23 192 lb 0.6 oz (87.1 kg)  01/04/23 189 lb (85.7 kg)       Assessment & Plan:  Dental abscess Assessment & Plan: Will  treat today with Augmentin for 7 days. 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  Nonpharmacological management for a tooth abscess can help alleviate symptoms and support healing. Here are some recommendations: -Warm Salt Water Rinse: Gargling with warm salt water several times a day can help reduce inflammation, soothe pain, and promote drainage of the  abscess. -Cold Compress: Applying a cold compress to the outside of the cheek near the affected area can help reduce swelling and numb pain. Peri Jefferson Oral Hygiene: Maintain excellent oral hygiene by brushing teeth gently and flossing to prevent further infection. Use an antimicrobial mouthwash if recommended by a dentist. -Soft Foods: Consume soft foods that are easy to chew and swallow, avoiding hard or crunchy foods that may irritate the abscess. -Stay Hydrated: Drink plenty of fluids to help flush out toxins and support overall health. -Rest: Ensure adequate rest to allow the body to heal. -Avoid Irritants: Stay away from tobacco and alcohol, which can irritate the gums and delay healing. -Manage Stress: Practice relaxation techniques such as deep breathing, meditation, or yoga to help manage stress, which can exacerbate discomfort. -Dietary Changes: Incorporate anti-inflammatory foods, such as fruits, vegetables, whole grains, and healthy fats, to support immune function   Orders: -     Amoxicillin-Pot Clavulanate; Take 1 tablet by mouth 2 (two) times daily for 7 days.  Dispense: 14 tablet; Refill: 0  Yeast vaginitis -     Fluconazole; Take 1 tablet (150 mg total) by mouth once for 1 dose.  Dispense: 1 tablet; Refill: 0  Note: This chart has been completed using Engineer, civil (consulting) software, and while attempts have been made to ensure accuracy, certain words and phrases may not be transcribed as intended.    Gilmore Laroche, FNP

## 2023-07-10 NOTE — Assessment & Plan Note (Signed)
Will treat today with Augmentin for 7 days. 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  Nonpharmacological management for a tooth abscess can help alleviate symptoms and support healing. Here are some recommendations: -Warm Salt Water Rinse: Gargling with warm salt water several times a day can help reduce inflammation, soothe pain, and promote drainage of the abscess. -Cold Compress: Applying a cold compress to the outside of the cheek near the affected area can help reduce swelling and numb pain. Peri Jefferson Oral Hygiene: Maintain excellent oral hygiene by brushing teeth gently and flossing to prevent further infection. Use an antimicrobial mouthwash if recommended by a dentist. -Soft Foods: Consume soft foods that are easy to chew and swallow, avoiding hard or crunchy foods that may irritate the abscess. -Stay Hydrated: Drink plenty of fluids to help flush out toxins and support overall health. -Rest: Ensure adequate rest to allow the body to heal. -Avoid Irritants: Stay away from tobacco and alcohol, which can irritate the gums and delay healing. -Manage Stress: Practice relaxation techniques such as deep breathing, meditation, or yoga to help manage stress, which can exacerbate discomfort. -Dietary Changes: Incorporate anti-inflammatory foods, such as fruits, vegetables, whole grains, and healthy fats, to support immune function

## 2023-07-10 NOTE — Assessment & Plan Note (Deleted)
Will treat today with Augmentin for 7 days. 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  Nonpharmacological management for a tooth abscess can help alleviate symptoms and support healing. Here are some recommendations: -Warm Salt Water Rinse: Gargling with warm salt water several times a day can help reduce inflammation, soothe pain, and promote drainage of the abscess. -Cold Compress: Applying a cold compress to the outside of the cheek near the affected area can help reduce swelling and numb pain. Peri Jefferson Oral Hygiene: Maintain excellent oral hygiene by brushing teeth gently and flossing to prevent further infection. Use an antimicrobial mouthwash if recommended by a dentist. -Soft Foods: Consume soft foods that are easy to chew and swallow, avoiding hard or crunchy foods that may irritate the abscess. -Stay Hydrated: Drink plenty of fluids to help flush out toxins and support overall health. -Rest: Ensure adequate rest to allow the body to heal. -Avoid Irritants: Stay away from tobacco and alcohol, which can irritate the gums and delay healing. -Manage Stress: Practice relaxation techniques such as deep breathing, meditation, or yoga to help manage stress, which can exacerbate discomfort. -Dietary Changes: Incorporate anti-inflammatory foods, such as fruits, vegetables, whole grains, and healthy fats, to support immune function

## 2023-07-10 NOTE — Patient Instructions (Addendum)
I appreciate the opportunity to provide care to you today!   Nonpharmacological management for a tooth abscess can help alleviate symptoms and support healing. Here are some recommendations: -Warm Salt Water Rinse: Gargling with warm salt water several times a day can help reduce inflammation, soothe pain, and promote drainage of the abscess. -Cold Compress: Applying a cold compress to the outside of the cheek near the affected area can help reduce swelling and numb pain. Bethany Gomez Oral Hygiene: Maintain excellent oral hygiene by brushing teeth gently and flossing to prevent further infection. Use an antimicrobial mouthwash if recommended by a dentist. -Soft Foods: Consume soft foods that are easy to chew and swallow, avoiding hard or crunchy foods that may irritate the abscess. -Stay Hydrated: Drink plenty of fluids to help flush out toxins and support overall health. -Rest: Ensure adequate rest to allow the body to heal. -Avoid Irritants: Stay away from tobacco and alcohol, which can irritate the gums and delay healing. -Manage Stress: Practice relaxation techniques such as deep breathing, meditation, or yoga to help manage stress, which can exacerbate discomfort. -Dietary Changes: Incorporate anti-inflammatory foods, such as fruits, vegetables, whole grains, and healthy fats, to support immune function.   Please continue to a heart-healthy diet and increase your physical activities. Try to exercise for at least five days a week.    It was a pleasure to see you and I look forward to continuing to work together on your health and well-being. Please do not hesitate to call the office if you need care or have questions about your care.  In case of emergency, please visit the Emergency Department for urgent care, or contact our clinic at [phone] to schedule an appointment. We're here to help you!   Have a wonderful day and week. With Gratitude, Gilmore Laroche MSN, FNP-BC

## 2023-07-13 ENCOUNTER — Encounter (HOSPITAL_COMMUNITY): Payer: Self-pay | Admitting: Emergency Medicine

## 2023-07-13 ENCOUNTER — Emergency Department (HOSPITAL_COMMUNITY)
Admission: EM | Admit: 2023-07-13 | Discharge: 2023-07-13 | Disposition: A | Discharge status: Home / Self Care | Payer: Medicaid Other | Attending: Emergency Medicine | Admitting: Emergency Medicine

## 2023-07-13 ENCOUNTER — Other Ambulatory Visit: Payer: Self-pay

## 2023-07-13 DIAGNOSIS — K0889 Other specified disorders of teeth and supporting structures: Secondary | ICD-10-CM | POA: Diagnosis present

## 2023-07-13 MED ORDER — CLINDAMYCIN HCL 300 MG PO CAPS
300.0000 mg | ORAL_CAPSULE | Freq: Three times a day (TID) | ORAL | 0 refills | Status: AC
Start: 2023-07-13 — End: 2023-07-23

## 2023-07-13 MED ORDER — HYDROCODONE-ACETAMINOPHEN 5-325 MG PO TABS
2.0000 | ORAL_TABLET | Freq: Once | ORAL | Status: AC
  Administered 2023-07-13: 2 via ORAL
  Filled 2023-07-13: qty 2

## 2023-07-13 MED ORDER — CLINDAMYCIN HCL 150 MG PO CAPS
300.0000 mg | ORAL_CAPSULE | Freq: Once | ORAL | Status: AC
  Administered 2023-07-13: 300 mg via ORAL
  Filled 2023-07-13: qty 2

## 2023-07-13 MED ORDER — NAPROXEN 500 MG PO TBEC: 500.0000 mg | DELAYED_RELEASE_TABLET | Freq: Two times a day (BID) | ORAL | 0 refills | Status: DC

## 2023-07-13 NOTE — Discharge Instructions (Addendum)
Return if any problems.

## 2023-07-13 NOTE — ED Triage Notes (Signed)
Pt presents with left lower dental pain, schedule for bilateral wisdom tooth extraction in November, given meds for infection, but no pain meds.

## 2023-07-13 NOTE — ED Provider Notes (Signed)
[place] EMERGENCY DEPARTMENT AT [facility] Provider Note   CSN: [id number] Arrival date & time: 07/13/23  1653     History  Chief Complaint  Patient presents with   Dental Pain    Bethany RUSSI is a 32 y.o. female.  Pt complains of a toothache.  Pt reports she is suppose to have wisdom teeth extracted.  Pt reports she can not get an appointment until November.  Pt denies any fever or chills.   The history is provided by the patient. No language interpreter was used.  Dental Pain Location:  Lower Quality:  Aching Severity:  Moderate Onset quality:  Sudden Duration:  1 week Progression:  Worsening Chronicity:  New      Home Medications Prior to Admission medications   Medication Sig Start Date End Date Taking? Authorizing Provider  clindamycin (CLEOCIN) 300 MG capsule Take 1 capsule (300 mg total) by mouth 3 (three) times daily for 10 days. 07/13/23 07/23/23 Yes Elson Areas, PA-C  naproxen (EC-NAPROXEN) 500 MG EC tablet Take 1 tablet (500 mg total) by mouth 2 (two) times daily with a meal. 07/13/23  Yes Cheron Schaumann K, PA-C  amLODipine (NORVASC) 10 MG tablet Take 1 tablet (10 mg total) by mouth daily. 11/16/22   Gilmore Laroche, FNP  amoxicillin-clavulanate (AUGMENTIN) 875-125 MG tablet Take 1 tablet by mouth 2 (two) times daily for 7 days. 07/10/23 07/17/23  Gilmore Laroche, FNP  cetirizine (ZYRTEC) 10 MG tablet Take 1 tablet (10 mg total) by mouth daily. 02/07/23   Gilmore Laroche, FNP  losartan-hydrochlorothiazide (HYZAAR) 50-12.5 MG tablet Take 1 tablet by mouth daily. 11/16/22   Gilmore Laroche, FNP  Vitamin D, Ergocalciferol, (DRISDOL) 1.25 MG (50000 UNIT) CAPS capsule Take 1 capsule (50,000 Units total) by mouth every 7 (seven) days. 01/04/23   Gilmore Laroche, FNP      Allergies    Latex    Review of Systems   Review of Systems  All other systems reviewed and are negative.   Physical Exam Updated Vital Signs BP (!) 148/98   Pulse 80   Temp  98.8 F (37.1 C)   Resp 16   Ht 5\' 3"  (1.6 m)   Wt 85.3 kg   SpO2 100%   BMI 33.30 kg/m  Physical Exam Vitals and nursing note reviewed.  Constitutional:      Appearance: She is well-developed.  HENT:     Head: Normocephalic.     Mouth/Throat:     Mouth: Mucous membranes are moist.     Comments: Swelling left lower gum.  Cardiovascular:     Rate and Rhythm: Normal rate.  Pulmonary:     Effort: Pulmonary effort is normal.  Abdominal:     General: There is no distension.  Musculoskeletal:        General: Normal range of motion.  Skin:    General: Skin is warm.  Neurological:     General: No focal deficit present.     Mental Status: She is alert and oriented to person, place, and time.     ED Results / Procedures / Treatments   Labs (all labs ordered are listed, but only abnormal results are displayed) Labs Reviewed - No data to display  EKG None  Radiology No results found.  Procedures Procedures    Medications Ordered in ED Medications  clindamycin (CLEOCIN) capsule 300 mg (300 mg Oral Given 07/13/23 2022)  HYDROcodone-acetaminophen (NORCO/VICODIN) 5-325 MG per tablet 2 tablet (2 tablets Oral Given 07/13/23  2022)    ED Course/ Medical Decision Making/ A&P                                 Medical Decision Making Pt complains of swelling and dental pain   Amount and/or Complexity of Data Reviewed Labs: ordered. Decision-making details documented in ED Course.  Risk Prescription drug management.           Final Clinical Impression(s) / ED Diagnoses Final diagnoses:  Toothache    Rx / DC Orders ED Discharge Orders          Ordered    clindamycin (CLEOCIN) 300 MG capsule  3 times daily        07/13/23 2019    naproxen (EC-NAPROXEN) 500 MG EC tablet  2 times daily with meals        07/13/23 2019           An After Visit Summary was printed and given to the patient.    Elson Areas, PA-C 07/13/23 2317    Eber Hong,  MD 07/14/23 [phone]

## 2023-07-14 ENCOUNTER — Other Ambulatory Visit: Payer: Self-pay | Admitting: Family Medicine

## 2023-07-14 DIAGNOSIS — I10 Essential (primary) hypertension: Secondary | ICD-10-CM

## 2024-01-09 ENCOUNTER — Emergency Department (HOSPITAL_COMMUNITY)

## 2024-01-09 ENCOUNTER — Other Ambulatory Visit: Payer: Self-pay

## 2024-01-09 ENCOUNTER — Emergency Department (HOSPITAL_COMMUNITY)
Admission: EM | Admit: 2024-01-09 | Discharge: 2024-01-09 | Disposition: A | Discharge status: Home / Self Care | Attending: Emergency Medicine | Admitting: Emergency Medicine

## 2024-01-09 ENCOUNTER — Encounter (HOSPITAL_COMMUNITY): Payer: Self-pay | Admitting: Emergency Medicine

## 2024-01-09 DIAGNOSIS — I10 Essential (primary) hypertension: Secondary | ICD-10-CM | POA: Insufficient documentation

## 2024-01-09 DIAGNOSIS — Z79899 Other long term (current) drug therapy: Secondary | ICD-10-CM | POA: Insufficient documentation

## 2024-01-09 DIAGNOSIS — Z91040 Latex allergy status: Secondary | ICD-10-CM | POA: Insufficient documentation

## 2024-01-09 DIAGNOSIS — B349 Viral infection, unspecified: Secondary | ICD-10-CM | POA: Insufficient documentation

## 2024-01-09 DIAGNOSIS — R059 Cough, unspecified: Secondary | ICD-10-CM | POA: Diagnosis present

## 2024-01-09 LAB — SARS CORONAVIRUS 2 BY RT PCR: SARS Coronavirus 2 by RT PCR: NEGATIVE

## 2024-01-09 MED ORDER — ALBUTEROL SULFATE HFA 108 (90 BASE) MCG/ACT IN AERS
2.0000 | INHALATION_SPRAY | Freq: Once | RESPIRATORY_TRACT | Status: AC
  Administered 2024-01-09: 2 via RESPIRATORY_TRACT
  Filled 2024-01-09: qty 6.7

## 2024-01-09 MED ORDER — BENZONATATE 200 MG PO CAPS: 200.0000 mg | ORAL_CAPSULE | Freq: Three times a day (TID) | ORAL | 0 refills | Status: DC | PRN

## 2024-01-09 NOTE — Discharge Instructions (Signed)
 You may take Tylenol and/or ibuprofen as directed for fever and bodyaches.  Drink plenty of fluids.  Use the albuterol inhaler 1 to 2 puffs every 4-6 hours as needed for your shortness of breath.  You have been prescribed medication to help with your cough.  Please follow-up with your primary care provider for recheck if needed or return to emergency department if you develop any new or worsening symptoms.

## 2024-01-09 NOTE — ED Notes (Signed)
 Pt ambulated around nurses station. NAD, oxygen 100% RA

## 2024-01-09 NOTE — ED Provider Notes (Signed)
 [place] EMERGENCY DEPARTMENT AT [facility] Provider Note   CSN: [id number] Arrival date & time: 01/09/24  4098     History  Chief Complaint  Patient presents with   Shortness of Breath    Bethany Gomez is a 33 y.o. female.   Shortness of Breath Associated symptoms: cough and sore throat   Associated symptoms: no abdominal pain, no chest pain, no fever, no headaches, no neck pain, no rash, no vomiting and no wheezing        Bethany Gomez is a 33 y.o. female past medical history of hypertension who presents to the Emergency Department complaining of cough, shortness of breath, fatigue and nasal congestion.  Symptoms present for 3 to 4 days.  Initially she thought she had seasonal allergy symptoms, taking prescribed allergy medication without relief.  Felt some shortness of breath this morning and took a home COVID test that was positive.  Here because of her shortness of breath.  Cough mostly nonproductive.  Child at home also has similar symptoms.  No reported fever.  Some mildly loose stools few days ago.  No vomiting or chest or abdominal pain.  No history of asthma  Home Medications Prior to Admission medications   Medication Sig Start Date End Date Taking? Authorizing Provider  amLODipine (NORVASC) 10 MG tablet Take 1 tablet by mouth once daily 07/17/23   Gilmore Laroche, FNP  cetirizine (ZYRTEC) 10 MG tablet Take 1 tablet (10 mg total) by mouth daily. 02/07/23   Gilmore Laroche, FNP  losartan-hydrochlorothiazide [facility]) 50-12.5 MG tablet Take 1 tablet by mouth once daily 07/17/23   Gilmore Laroche, FNP  naproxen (EC-NAPROXEN) 500 MG EC tablet Take 1 tablet (500 mg total) by mouth 2 (two) times daily with a meal. 07/13/23   Elson Areas, PA-C  Vitamin D, Ergocalciferol, (DRISDOL) 1.25 MG (50000 UNIT) CAPS capsule Take 1 capsule (50,000 Units total) by mouth every 7 (seven) days. 01/04/23   Gilmore Laroche, FNP      Allergies    Latex    Review of Systems    Review of Systems  Constitutional:  Positive for fatigue. Negative for appetite change, chills and fever.  HENT:  Positive for congestion and sore throat. Negative for trouble swallowing.   Respiratory:  Positive for cough and shortness of breath. Negative for wheezing.   Cardiovascular:  Negative for chest pain, palpitations and leg swelling.  Gastrointestinal:  Positive for diarrhea. Negative for abdominal pain, nausea and vomiting.  Musculoskeletal:  Negative for arthralgias, myalgias, neck pain and neck stiffness.  Skin:  Negative for rash.  Neurological:  Negative for dizziness, tremors, weakness, numbness and headaches.  Psychiatric/Behavioral:  Negative for confusion.     Physical Exam Updated Vital Signs BP (!) 150/97 (BP Location: Right Arm)   Pulse 85   Temp 99 F (37.2 C) (Oral)   Resp 20   Ht 5\' 3"  (1.6 m)   Wt 86.2 kg   SpO2 98%   BMI 33.66 kg/m  Physical Exam Vitals and nursing note reviewed.  Constitutional:      General: She is not in acute distress.    Appearance: She is well-developed. She is not ill-appearing or toxic-appearing.  HENT:     Right Ear: Tympanic membrane and ear canal normal.     Left Ear: Tympanic membrane and ear canal normal.     Mouth/Throat:     Mouth: Mucous membranes are moist.     Pharynx: Oropharynx is clear. No oropharyngeal  exudate or posterior oropharyngeal erythema.     Comments: Mild erythema of the oropharynx without edema or exudate.  Uvula midline and nonedematous. Cardiovascular:     Rate and Rhythm: Normal rate and regular rhythm.     Pulses: Normal pulses.  Pulmonary:     Effort: Pulmonary effort is normal. No respiratory distress.     Breath sounds: No wheezing or rales.  Chest:     Chest wall: No tenderness.  Abdominal:     Palpations: Abdomen is soft.     Tenderness: There is no abdominal tenderness.  Musculoskeletal:        General: Normal range of motion.     Cervical back: Normal range of motion. No rigidity  or tenderness.  Lymphadenopathy:     Cervical: No cervical adenopathy.  Skin:    General: Skin is warm.     Capillary Refill: Capillary refill takes less than 2 seconds.  Neurological:     General: No focal deficit present.     Mental Status: She is alert.     Sensory: No sensory deficit.     Motor: No weakness.     ED Results / Procedures / Treatments   Labs (all labs ordered are listed, but only abnormal results are displayed) Labs Reviewed  SARS CORONAVIRUS 2 BY RT PCR    EKG EKG Interpretation Date/Time:  Tuesday January 09 2024 09:15:16 EDT Ventricular Rate:  84 PR Interval:  123 QRS Duration:  138 QT Interval:  400 QTC Calculation: 493 R Axis:   18  Text Interpretation: Sinus rhythm Right bundle branch block No acute changes Nonspecific ST and T wave abnormality No significant change since last tracing Confirmed by Derwood Kaplan [phone]) on 01/09/2024 10:02:13 AM  Radiology DG Chest Portable 1 View Result Date: 01/09/2024 CLINICAL DATA:  Shortness of breath. EXAM: PORTABLE CHEST 1 VIEW COMPARISON:  02/10/2014. FINDINGS: Bilateral lung fields are clear. Bilateral costophrenic angles are clear. Normal cardio-mediastinal silhouette. No acute osseous abnormalities. The soft tissues are within normal limits. IMPRESSION: No active disease. Electronically Signed   By: Jules Schick M.D.   On: 01/09/2024 09:28    Procedures Procedures    Medications Ordered in ED Medications - No data to display  ED Course/ Medical Decision Making/ A&P                                 Medical Decision Making Patient here for URI symptoms for several days.  No known fever.  Some loose stools without significant diarrhea no melena or hematochezia.  No abdominal pain chest pain.  She does report cough, sore throat and shortness of breath.  Took a COVID test this morning that was positive.  Her child has similar symptoms as well.   Suspect viral process.  Pneumonia, bronchitis, also  considered.  PE also considered but felt less likely.  She is PERC negative.  Amount and/or Complexity of Data Reviewed Labs: ordered.    Details: COVID test negative Radiology: ordered.    Details: Chest x-ray without presence of active disease ECG/medicine tests: ordered.    Details: EKG shows sinus rhythm with right bundle branch block nonspecific ST and T wave abnormality, no significant change since last tracing Discussion of management or test interpretation with external provider(s): Patient with URI symptoms for several days reports positive home COVID test today test here negative.  Chest x-ray reassuring.  Vital signs reviewed, PERC negative.  I suspect this is viral process.  Patient has photo of positive COVID test that she took at home prior to arrival, her COVID test here is negative.  She is agreeable to symptomatic treatment and close outpatient follow-up.Aaron Aas  Appropriate for discharge home.  Risk Prescription drug management.           Final Clinical Impression(s) / ED Diagnoses Final diagnoses:  Viral illness    Rx / DC Orders ED Discharge Orders     None         Catherne Clubs, PA-C 01/09/24 1133    Deatra Face, MD 01/10/24 [phone]

## 2024-01-09 NOTE — ED Triage Notes (Signed)
 Pt presents with SOB, fatigue and congestion since Saturday, took at home test, tested positive for Covid.

## 2024-01-09 NOTE — ED Notes (Signed)
 X-Ray at bedside,completed.

## 2024-03-13 ENCOUNTER — Emergency Department (HOSPITAL_COMMUNITY): Admission: EM | Admit: 2024-03-13 | Discharge: 2024-03-13 | Disposition: A | Discharge status: Home / Self Care

## 2024-03-13 ENCOUNTER — Encounter (HOSPITAL_COMMUNITY): Payer: Self-pay | Admitting: Emergency Medicine

## 2024-03-13 ENCOUNTER — Other Ambulatory Visit: Payer: Self-pay

## 2024-03-13 ENCOUNTER — Emergency Department (HOSPITAL_COMMUNITY)

## 2024-03-13 DIAGNOSIS — R519 Headache, unspecified: Secondary | ICD-10-CM | POA: Diagnosis present

## 2024-03-13 DIAGNOSIS — R6884 Jaw pain: Secondary | ICD-10-CM | POA: Diagnosis not present

## 2024-03-13 DIAGNOSIS — M79605 Pain in left leg: Secondary | ICD-10-CM

## 2024-03-13 DIAGNOSIS — M79662 Pain in left lower leg: Secondary | ICD-10-CM | POA: Insufficient documentation

## 2024-03-13 DIAGNOSIS — Z91040 Latex allergy status: Secondary | ICD-10-CM | POA: Insufficient documentation

## 2024-03-13 NOTE — ED Triage Notes (Signed)
 Pt presents with left jaw pain, also having pain along left side of body from head to feet for over one month.

## 2024-03-13 NOTE — ED Notes (Signed)
 Pt completely assessed by EDP and set to discharge. No RN assessment performed.  Pt made aware of elevated BP. Pt instructed on how to take ambulatory readings and educated to follow up with PCP if readings remain elevated.   Pt provided discharge instructions and prescription information. Pt was given the opportunity to ask questions and questions were answered.

## 2024-03-13 NOTE — Discharge Instructions (Addendum)
 In regards to your facial pain, I do wonder if this could be related to TMJ.  Do think you should follow back up with your dentist.  Also think it is reasonable to follow-up with neurology if you have ongoing headaches.  Sometimes they can help treat chronic headaches with certain medication.  EKG showed no changes compared to prior.   DVT ultrasound because you complained about some swelling of this left lower extremity.  This showed no obvious DVT.

## 2024-03-13 NOTE — ED Provider Notes (Signed)
 [place] EMERGENCY DEPARTMENT AT [facility] Provider Note   CSN: [id number] Arrival date & time: 03/13/24  9629     Patient presents with: Jaw Pain (Left)   Bethany Gomez is a 33 y.o. female.   HPI     Patient presents due to left-sided headache and jaw pain.  Patient states that she states that this has been happening for months.  She has been following up with dentistry for this.  States that she had wisdom teeth removed.  Recently had dental work completed on the right lower molar side about a week ago.  Patient states that she usually has left-sided jaw pain specifically when opening her mouth and closing.  Sometimes she can appreciate a popping sound.  Patient states that she is also been having temporary headaches.  Has been taking Tylenol  and ibuprofen  for this.  She denies any Exertional chest pain.  No pleuritic chest pain.  Denies any history of any kind of cardiac disease pathology.  Denies any history of DVT or PE.  Denies any vision changes.  Denies any weakness or numbness in any unilateral extremity.  Is also endorsing some slight swelling of her left lower extremity.  This is new for her.  Is been present for the past 1 to 2 days.  No history of DVT or PE.  No recent long travel history.  No pleuritic chest pain.  No hemoptysis.  Previous visit reviewed: Patient was last seen in the ED in April 2025.  Was seen because of cough with sore throat.  Tested positive for COVID-19.  Prior to Admission medications   Medication Sig Start Date End Date Taking? Authorizing Provider  amLODipine  (NORVASC ) 10 MG tablet Take 1 tablet by mouth once daily 07/17/23   Zarwolo, [name], FNP  benzonatate  (TESSALON ) 200 MG capsule Take 1 capsule (200 mg total) by mouth 3 (three) times daily as needed. Swallow whole do not chew 01/09/24   Triplett, Tammy, PA-C  cetirizine  (ZYRTEC ) 10 MG tablet Take 1 tablet (10 mg total) by mouth daily. 02/07/23   Zarwolo, [name], FNP   losartan -hydrochlorothiazide (HYZAAR) 50-12.5 MG tablet Take 1 tablet by mouth once daily 07/17/23   Zarwolo, [name], FNP  naproxen  (EC-NAPROXEN ) 500 MG EC tablet Take 1 tablet (500 mg total) by mouth 2 (two) times daily with a meal. 07/13/23   Sandi Crosby, PA-C  Vitamin D , Ergocalciferol , (DRISDOL ) 1.25 MG (50000 UNIT) CAPS capsule Take 1 capsule (50,000 Units total) by mouth every 7 (seven) days. 01/04/23   Zarwolo, [name], FNP    Allergies: Latex    Review of Systems  Constitutional:  Negative for chills and fever.  HENT:  Negative for ear pain and sore throat.   Eyes:  Negative for pain and visual disturbance.  Respiratory:  Negative for cough and shortness of breath.   Cardiovascular:  Negative for chest pain and palpitations.  Gastrointestinal:  Negative for abdominal pain and vomiting.  Genitourinary:  Negative for dysuria and hematuria.  Musculoskeletal:  Negative for arthralgias and back pain.  Skin:  Negative for color change and rash.  Neurological:  Negative for seizures and syncope.  All other systems reviewed and are negative.   Updated Vital Signs BP (!) 129/92 (BP Location: Right Arm)   Pulse 86   Temp 98.6 F (37 C) (Oral)   Resp 18   Ht 5' 3 (1.6 m)   Wt 81.6 kg   SpO2 99%   BMI 31.89 kg/m   Physical  Exam Vitals and nursing note reviewed.  Constitutional:      General: She is not in acute distress.    Appearance: She is well-developed.  HENT:     Head: Normocephalic and atraumatic.    Eyes:     Conjunctiva/sclera: Conjunctivae normal.    Cardiovascular:     Rate and Rhythm: Normal rate and regular rhythm.     Heart sounds: No murmur heard. Pulmonary:     Effort: Pulmonary effort is normal. No respiratory distress.     Breath sounds: Normal breath sounds.  Abdominal:     Palpations: Abdomen is soft.     Tenderness: There is no abdominal tenderness.   Musculoskeletal:        General: No swelling.     Cervical back: Neck supple.    Skin:    General: Skin is warm and dry.     Capillary Refill: Capillary refill takes less than 2 seconds.   Neurological:     Mental Status: She is alert.   Psychiatric:        Mood and Affect: Mood normal.          (all labs ordered are listed, but only abnormal results are displayed) Labs Reviewed - No data to display  EKG: EKG Interpretation Date/Time:  Wednesday March 13 2024 09:08:51 EDT Ventricular Rate:  77 PR Interval:  140 QRS Duration:  145 QT Interval:  410 QTC Calculation: 464 R Axis:   30  Text Interpretation: Sinus rhythm Right bundle branch block Confirmed by Shyrl Doyne [phone]) on 03/13/2024 9:15:25 AM  Radiology: US  Venous Img Lower Unilateral Left Result Date: 03/13/2024 CLINICAL DATA:  Diffuse LEFT-sided pain. Pain and swelling of LEFT lower extremity. EXAM: LEFT LOWER EXTREMITY VENOUS DOPPLER ULTRASOUND TECHNIQUE: Gray-scale sonography with compression, as well as color and duplex ultrasound, were performed to evaluate the deep venous system(s) from the level of the common femoral vein through the popliteal and proximal calf veins. COMPARISON:  None available FINDINGS: VENOUS Normal compressibility of the common femoral, superficial femoral, and popliteal veins, as well as the visualized calf veins. Visualized portions of profunda femoral vein and great saphenous vein unremarkable. No filling defects to suggest DVT on grayscale or color Doppler imaging. Doppler waveforms show normal direction of venous flow, normal respiratory plasticity and response to augmentation. Limited views of the contralateral common femoral vein are unremarkable. OTHER None. Limitations: none IMPRESSION: No DVT of the left lower extremity. Electronically Signed   By: Elester Grim M.D.   On: 03/13/2024 10:40     Procedures   Medications Ordered in the ED - No data to display                                  Medical Decision Making   Chart review by me:  Previous visit  reviewed: Patient was last seen in the ED in April 2025.  Was seen because of cough with sore throat.  Tested positive for COVID-19.   Patient presents due to left-sided headache and jaw pain.  Patient states that she states that this has been happening for months.  She has been following up with dentistry for this.  States that she had wisdom teeth removed.  Recently had dental work completed on the right lower molar side about a week ago.  Patient states that she usually has left-sided jaw pain specifically when opening her mouth and closing.  Sometimes she  can appreciate a popping sound.  Patient states that she is also been having temporary headaches.  Has been taking Tylenol  and ibuprofen  for this.  She denies any Exertional chest pain.  No pleuritic chest pain.  Denies any history of any kind of cardiac disease pathology.  Denies any history of DVT or PE.  Denies any vision changes.  Denies any weakness or numbness in any unilateral extremity.  Is also endorsing some slight swelling of her left lower extremity.  This is new for her.  Is been present for the past 1 to 2 days.  No history of DVT or PE.  No recent long travel history.  No pleuritic chest pain.  No hemoptysis.    Exam, patient hemodynamically stable  ANO x 3 GCS 15.  NIH score 0.  Was able to walk around the room under her own strength.  Strength and sensation intact in bilateral upper and lower extremities.  Cranial nerves II through XII intact without any Focal deficits. No Concerns with CVA.  No concerns for any intracranial bleed or pathology.  No indication for CT imaging at this point time.  Could be a possible TMJ association.  Recommend she follow-up with her dentist again regarding this.  No concerns for any kind of giant cell temporal arteritis.  Seems to be more of a chronic issue.  No vision changes.   Patient did complain about some left lower extremity swelling.  Exam was unremarkable.  Did obtain a DVT ultrasound given  family history of DVT in the past.  This is unremarkable.  Discharged in stable condition.              Final diagnoses:  Jaw pain  Nonintractable headache, unspecified chronicity pattern, unspecified headache type  Pain of left lower extremity    ED Discharge Orders     None          Spero Dye, MD 03/13/24 1110

## 2024-04-01 ENCOUNTER — Encounter: Payer: Self-pay | Admitting: Family Medicine

## 2024-04-01 ENCOUNTER — Ambulatory Visit: Payer: Self-pay | Admitting: Family Medicine

## 2024-04-01 VITALS — BP 127/84 | HR 96 | Resp 16 | Ht 63.0 in | Wt 200.4 lb

## 2024-04-01 DIAGNOSIS — E669 Obesity, unspecified: Secondary | ICD-10-CM | POA: Diagnosis not present

## 2024-04-01 DIAGNOSIS — R6884 Jaw pain: Secondary | ICD-10-CM

## 2024-04-01 MED ORDER — CYCLOBENZAPRINE HCL 5 MG PO TABS: 5.0000 mg | ORAL_TABLET | Freq: Three times a day (TID) | ORAL | 1 refills | Status: AC | PRN

## 2024-04-01 NOTE — Assessment & Plan Note (Signed)
 Review my weight management plan with the patient and encouraged the patient to reduce her portion size, reduce her sugar, sodium, carbohydrates, saturated and trans fat intake, Encouraged the patient to increase her servings of fruits and vegetables and to engage in at least 30 minutes daily, 5 days a week of moderate-intensity exercises

## 2024-04-01 NOTE — Assessment & Plan Note (Signed)
 The patient is encouraged to continue follow-up care with her dentist and to take ibuprofen  as needed for pain relief. A prescription for Flexeril  5 mg has been initiated to be taken at bedtime as needed for muscle relaxation. She is advised to apply warm compresses to the jaw area for 15-20 minutes several times daily and to perform TMJ exercises (attached to the AVS) to improve range of motion and reduce muscle tension. Patient education was provided, including avoiding excessive chewing (e.g., gum, tough meats), maintaining good posture, and avoiding jaw clenching. She is also instructed to monitor for red flag symptoms such as swelling, numbness, or unexplained weight loss and to report these symptoms immediately.

## 2024-04-01 NOTE — Patient Instructions (Addendum)
 I appreciate the opportunity to provide care to you today!    Follow up:  4 months  Labs: please stop by the lab during the week to get your blood drawn (CBC, CMP, TSH, Lipid profile, HgA1c, Vit D)  For a Healthier YOU, I Recommend: Reducing your intake of sugar, sodium, carbohydrates, and saturated fats. Increasing your fiber intake by incorporating more whole grains, fruits, and vegetables into your meals. Setting healthy goals with a focus on lowering your consumption of carbs, sugar, and unhealthy fats. Adding variety to your diet by including a wide range of fruits and vegetables. Cutting back on soda and limiting processed foods as much as possible. Staying active: In addition to taking your weight loss medication, aim for at least 150 minutes of moderate-intensity physical activity each week for optimal results.   Jaw Pain -Start taking cyclobenzaprine  5 mg at bedtime.  Nonpharmacologic Measures: -Follow a soft diet to reduce stress on the jaw. -Apply warm compresses to the jaw area for 15-20 minutes several times daily. -Perform TMJ exercises (Attached to AVS) as needed to improve range of motion and reduce muscle tension.  Patient Education: -Avoid excessive chewing (e.g., gum, tough meats). -Maintain good posture and avoid jaw clenching. -Monitor for red flag symptoms such as swelling, numbness, or unexplained weight loss, and report them immediately.   Please follow up if your symptoms worsen or fail to improve.    Please continue to a heart-healthy diet and increase your physical activities. Try to exercise for at least five days a week.    It was a pleasure to see you and I look forward to continuing to work together on your health and well-being. Please do not hesitate to call the office if you need care or have questions about your care.  In case of emergency, please visit the Emergency Department for urgent care, or contact our clinic at [phone] to  schedule an appointment. We're here to help you!   Have a wonderful day and week. With Gratitude, Tychelle Purkey MSN, FNP-BC

## 2024-04-01 NOTE — Progress Notes (Signed)
 Established Patient Office Visit  Subjective:  Patient ID: Bethany Gomez, female    DOB: 1991/08/08  Age: 33 y.o. MRN: [id number]  CC:  Chief Complaint  Patient presents with   Follow-up   Weight Gain    Wants to discuss why she is not losing any weight    Jaw Pain    Has been having jaw pain for years and wants it checked     HPI Bethany Gomez is a 33 y.o. female patient presents with complaints of difficulty losing weight despite adherence to a heart-healthy diet and increased physical activity. She is currently under the care of a dentist for jaw pain and reports taking ibuprofen  as needed for symptom relief. She is scheduled to follow up with her dentist in a few weeks. She denies fever, swelling, or decreased oral intake at this time.   Past Medical History:  Diagnosis Date   Headaches, cluster    History of gestational hypertension 02/09/2017   History of prior pregnancy with IUGR newborn 03/20/2017   3lb14oz @ 37wks      Hypertension    Mono exposure    Premenstrual dysphoric disorder    UTI (lower urinary tract infection)     Past Surgical History:  Procedure Laterality Date   NO PAST SURGERIES     WISDOM TOOTH EXTRACTION Bilateral     Family History  Problem Relation Age of Onset   Diabetes Mother    Fibromyalgia Mother    Hypertension Mother    Congestive Heart Failure Father    Narcolepsy Father    Aneurysm Father    Congestive Heart Failure Paternal Uncle    Diabetes Maternal Grandmother    Congestive Heart Failure Maternal Grandmother    Diabetes Maternal Grandfather    Congestive Heart Failure Maternal Grandfather    Diabetes Paternal Grandmother    Diabetes Paternal Grandfather    Congestive Heart Failure Paternal Grandfather    Colon cancer Neg Hx    Breast cancer Neg Hx     Social History   Socioeconomic History   Marital status: Single    Spouse name: Not on file   Number of children: 1   Years of education: Not on file   Highest  education level: Some college, no degree  Occupational History   Occupation: Scientist, product/process development    Comment: [employer]  Tobacco Use   Smoking status: Every Day    Current packs/day: 0.50    Average packs/day: 0.5 packs/day for 5.0 years (2.5 ttl pk-yrs)    Types: Cigarettes   Smokeless tobacco: Never  Vaping Use   Vaping status: Never Used  Substance and Sexual Activity   Alcohol use: Yes    Comment: rarely in social setting; maybe once per month   Drug use: Yes    Types: Marijuana   Sexual activity: Yes    Birth control/protection: Condom  Other Topics Concern   Not on file  Social History Narrative   Lives with her child, not currently.    Social Drivers of Corporate investment banker Strain: Low Risk  (12/22/2022)   Overall Financial Resource Strain (CARDIA)    Difficulty of Paying Living Expenses: Not very hard  Food Insecurity: No Food Insecurity (12/22/2022)   Hunger Vital Sign    Worried About Running Out of Food in the Last Year: Never true    Ran Out of Food in the Last Year: Never true  Transportation Needs: No Transportation Needs (12/22/2022)  PRAPARE - Administrator, Civil Service (Medical): No    Lack of Transportation (Non-Medical): No  Recent Concern: Transportation Needs - Unmet Transportation Needs (10/18/2022)   PRAPARE - Transportation    Lack of Transportation (Medical): Yes    Lack of Transportation (Non-Medical): Yes  Physical Activity: Insufficiently Active (12/22/2022)   Exercise Vital Sign    Days of Exercise per Week: 4 days    Minutes of Exercise per Session: 30 min  Stress: Stress Concern Present (12/22/2022)   [employer] of Occupational Health - Occupational Stress Questionnaire    Feeling of Stress : To some extent  Social Connections: Socially Isolated (12/22/2022)   Social Connection and Isolation Panel    Frequency of Communication with Friends and Family: More than three times a week    Frequency of Social Gatherings  with Friends and Family: Once a week    Attends Religious Services: Never    Database administrator or Organizations: No    Attends Engineer, structural: Not on file    Marital Status: Never married  Intimate Partner Violence: Not At Risk (10/18/2022)   Humiliation, Afraid, Rape, and Kick questionnaire    Fear of Current or Ex-Partner: No    Emotionally Abused: No    Physically Abused: No    Sexually Abused: No    Outpatient Medications Prior to Visit  Medication Sig Dispense Refill   amLODipine  (NORVASC ) 10 MG tablet Take 1 tablet by mouth once daily 180 tablet 0   cetirizine  (ZYRTEC ) 10 MG tablet Take 1 tablet (10 mg total) by mouth daily. 30 tablet 5   cholecalciferol (VITAMIN D3) 25 MCG (1000 UNIT) tablet Take 1,000 Units by mouth daily.     losartan -hydrochlorothiazide (HYZAAR) 50-12.5 MG tablet Take 1 tablet by mouth once daily 180 tablet 0   Vitamin D , Ergocalciferol , (DRISDOL ) 1.25 MG (50000 UNIT) CAPS capsule Take 1 capsule (50,000 Units total) by mouth every 7 (seven) days. (Patient not taking: Reported on 04/01/2024) 20 capsule 1   benzonatate  (TESSALON ) 200 MG capsule Take 1 capsule (200 mg total) by mouth 3 (three) times daily as needed. Swallow whole do not chew 21 capsule 0   naproxen  (EC-NAPROXEN ) 500 MG EC tablet Take 1 tablet (500 mg total) by mouth 2 (two) times daily with a meal. 20 tablet 0   No facility-administered medications prior to visit.    Allergies  Allergen Reactions   Latex Hives, Itching and Other (See Comments)    Reaction:  Burning    ROS Review of Systems  Constitutional:  Negative for chills and fever.  HENT:         Jaw pain  Eyes:  Negative for visual disturbance.  Respiratory:  Negative for chest tightness and shortness of breath.   Neurological:  Negative for dizziness and headaches.      Objective:    Physical Exam HENT:     Head: Normocephalic.     Mouth/Throat:     Mouth: Mucous membranes are moist.  Cardiovascular:      Rate and Rhythm: Normal rate.     Heart sounds: Normal heart sounds.  Pulmonary:     Effort: Pulmonary effort is normal.     Breath sounds: Normal breath sounds.  Neurological:     Mental Status: She is alert.     BP 127/84   Pulse 96   Resp 16   Ht 5' 3 (1.6 m)   Wt 200 lb 6.4 oz (90.9 kg)  SpO2 96%   BMI 35.50 kg/m  Wt Readings from Last 3 Encounters:  04/01/24 200 lb 6.4 oz (90.9 kg)  03/13/24 180 lb (81.6 kg)  01/09/24 190 lb (86.2 kg)    Lab Results  Component Value Date   TSH 0.806 01/12/2023   Lab Results  Component Value Date   WBC 6.9 01/12/2023   HGB 12.6 01/12/2023   HCT 40.3 01/12/2023   MCV 85 01/12/2023   PLT 433 01/12/2023   Lab Results  Component Value Date   NA 142 01/12/2023   K 4.1 01/12/2023   CO2 19 (L) 01/12/2023   GLUCOSE 103 (H) 01/12/2023   BUN 16 01/12/2023   CREATININE 0.86 01/12/2023   BILITOT <0.2 01/12/2023   ALKPHOS 61 01/12/2023   AST 16 01/12/2023   ALT 21 01/12/2023   PROT 7.3 01/12/2023   ALBUMIN 4.3 01/12/2023   CALCIUM 9.9 01/12/2023   ANIONGAP 8 01/28/2020   EGFR 93 01/12/2023   Lab Results  Component Value Date   CHOL 170 01/12/2023   Lab Results  Component Value Date   HDL 37 (L) 01/12/2023   Lab Results  Component Value Date   LDLCALC 123 (H) 01/12/2023   Lab Results  Component Value Date   TRIG 50 01/12/2023   Lab Results  Component Value Date   CHOLHDL 4.6 (H) 01/12/2023   Lab Results  Component Value Date   HGBA1C 5.6 01/12/2023      Assessment & Plan:  Jaw pain Assessment & Plan: The patient is encouraged to continue follow-up care with her dentist and to take ibuprofen  as needed for pain relief. A prescription for Flexeril  5 mg has been initiated to be taken at bedtime as needed for muscle relaxation. She is advised to apply warm compresses to the jaw area for 15-20 minutes several times daily and to perform TMJ exercises (attached to the AVS) to improve range of motion and reduce  muscle tension. Patient education was provided, including avoiding excessive chewing (e.g., gum, tough meats), maintaining good posture, and avoiding jaw clenching. She is also instructed to monitor for red flag symptoms such as swelling, numbness, or unexplained weight loss and to report these symptoms immediately.    Orders: -     Cyclobenzaprine  HCl; Take 1 tablet (5 mg total) by mouth 3 (three) times daily as needed for muscle spasms.  Dispense: 30 tablet; Refill: 1  Obesity (BMI 30-39.9) Assessment & Plan: Review my weight management plan with the patient and encouraged the patient to reduce her portion size, reduce her sugar, sodium, carbohydrates, saturated and trans fat intake, Encouraged the patient to increase her servings of fruits and vegetables and to engage in at least 30 minutes daily, 5 days a week of moderate-intensity exercises    Note: This chart has been completed using Engineer, civil (consulting) software, and while attempts have been made to ensure accuracy, certain words and phrases may not be transcribed as intended.    Follow-up: Return in about 4 months (around 08/02/2024).   Akin Yi, FNP

## 2024-04-07 ENCOUNTER — Other Ambulatory Visit: Payer: Self-pay | Admitting: Family Medicine

## 2024-04-07 DIAGNOSIS — I10 Essential (primary) hypertension: Secondary | ICD-10-CM

## 2024-04-23 ENCOUNTER — Encounter: Payer: Self-pay | Admitting: Adult Health

## 2024-04-23 ENCOUNTER — Ambulatory Visit: Admitting: Adult Health

## 2024-04-23 VITALS — BP 129/87 | HR 85 | Ht 63.0 in | Wt 201.0 lb

## 2024-04-23 DIAGNOSIS — Z3046 Encounter for surveillance of implantable subdermal contraceptive: Secondary | ICD-10-CM | POA: Diagnosis not present

## 2024-04-23 DIAGNOSIS — Z3202 Encounter for pregnancy test, result negative: Secondary | ICD-10-CM | POA: Diagnosis not present

## 2024-04-23 DIAGNOSIS — Z30011 Encounter for initial prescription of contraceptive pills: Secondary | ICD-10-CM

## 2024-04-23 LAB — POCT URINE PREGNANCY: Preg Test, Ur: NEGATIVE

## 2024-04-23 MED ORDER — LO LOESTRIN FE 1 MG-10 MCG / 10 MCG PO TABS: 1.0000 | ORAL_TABLET | Freq: Every day | ORAL | 4 refills | Status: AC

## 2024-04-23 NOTE — Progress Notes (Signed)
  Subjective:     Patient ID: Bethany Gomez, female   DOB: [birth year], 33 y.o.   MRN: [id number]  HPI Bethany Gomez is a 33 year old back female,single, G1P1001, in for nexplanon  removal and discuss birth control options.     Component Value Date/Time   DIAGPAP (A) 10/18/2022 1128    - Atypical squamous cells of undetermined significance (ASC-US )   HPVHIGH Negative 10/18/2022 1128   ADEQPAP  10/18/2022 1128    Satisfactory for evaluation; transformation zone component PRESENT.    PCP is G Zarwolo NP  Review of Systems For nexplanon  removal Denies MI,stroke,DVT,breast cancer or migraine with aura.  Reviewed past medical,surgical, social and family history. Reviewed medications and allergies.     Objective:   Physical Exam BP 129/87 (BP Location: Right Arm, Patient Position: Sitting)   Pulse 85   Ht 5' 3 (1.6 m)   Wt 201 lb (91.2 kg)   BMI 35.61 kg/m  UPT is negative. Left arm cleansed with betadine, and injected with 1.5 cc 2% lidocaine  and waited til numb.Under sterile technique a #11 blade was used to make small vertical incision, and a curved forceps was used to easily remove rod. Steri strips applied. Pressure dressing applied.      Upstream - 04/23/24 0950       Pregnancy Intention Screening   Does the patient want to become pregnant in the next year? No    Does the patient's partner want to become pregnant in the next year? No    Would the patient like to discuss contraceptive options today? Yes      Contraception Wrap Up   Current Method Hormonal Implant    End Method Oral Contraceptive    Contraception Counseling Provided Yes    How was the end contraceptive method provided? Prescription          Assessment:        1. Negative pregnancy test - POCT urine pregnancy  2. Encounter for Nexplanon  removal (Primary) Use condoms x 4 weeks, keep clean and dry x 24 hours, no heavy lifting, keep steri strips on x 72 hours, Keep pressure dressing on x 24 hours. Follow up prn  problems.    3. Encounter for initial prescription of contraceptive pills Will rx lo Loestrin , can start today Take 1 daily at same time, use condoms for 1 pack if has sex Meds ordered this encounter  Medications   Norethindrone -Ethinyl Estradiol-Fe Biphas (LO LOESTRIN FE ) 1 MG-10 MCG / 10 MCG tablet    Sig: Take 1 tablet by mouth daily. Take 1 daily by mouth    Dispense:  84 tablet    Refill:  4    BIN [id number], PCN [id number], GRP [id number],PI [id number]    Supervising Provider:   JAYNE MINDER H [[id number]]    Follow up with me   Plan:      Follow up with me in 3 months for ROS

## 2024-04-23 NOTE — Patient Instructions (Addendum)
 Use condoms x 4 weeks, keep clean and dry x 24 hours, no heavy lifting, keep steri strips on x 72 hours, Keep pressure dressing on x 24 hours. Follow up prn problems. Start BCP today.

## 2024-05-01 ENCOUNTER — Other Ambulatory Visit: Payer: Self-pay

## 2024-05-01 DIAGNOSIS — Z0000 Encounter for general adult medical examination without abnormal findings: Secondary | ICD-10-CM

## 2024-05-01 DIAGNOSIS — E559 Vitamin D deficiency, unspecified: Secondary | ICD-10-CM

## 2024-05-01 DIAGNOSIS — E785 Hyperlipidemia, unspecified: Secondary | ICD-10-CM

## 2024-05-01 DIAGNOSIS — I10 Essential (primary) hypertension: Secondary | ICD-10-CM

## 2024-05-04 LAB — CMP14+EGFR
ALT: 20 IU/L (ref 0–32)
AST: 19 IU/L (ref 0–40)
Albumin: 4.4 g/dL (ref 3.9–4.9)
Alkaline Phosphatase: 71 IU/L (ref 44–121)
BUN/Creatinine Ratio: 18 (ref 9–23)
BUN: 13 mg/dL (ref 6–20)
Bilirubin Total: <0.2 mg/dL (ref 0.0–1.2)
CO2: 21 mmol/L (ref 20–29)
Calcium: 10 mg/dL (ref 8.7–10.2)
Chloride: 102 mmol/L (ref 96–106)
Creatinine, Ser: 0.72 mg/dL (ref 0.57–1.00)
Globulin, Total: 2.8 g/dL (ref 1.5–4.5)
Glucose: 85 mg/dL (ref 70–99)
Potassium: 4.1 mmol/L (ref 3.5–5.2)
Sodium: 139 mmol/L (ref 134–144)
Total Protein: 7.2 g/dL (ref 6.0–8.5)
eGFR: 115 mL/min/1.73 (ref >59)

## 2024-05-04 LAB — CBC WITH DIFFERENTIAL/PLATELET
Basophils Absolute: 0.1 x10E3/uL (ref 0.0–0.2)
Basos: 1 %
EOS (ABSOLUTE): 0.2 x10E3/uL (ref 0.0–0.4)
Eos: 2 %
Hematocrit: 43.2 % (ref 34.0–46.6)
Hemoglobin: 13.4 g/dL (ref 11.1–15.9)
Immature Grans (Abs): 0.1 x10E3/uL (ref 0.0–0.1)
Immature Granulocytes: 1 %
Lymphocytes Absolute: 3.5 x10E3/uL — ABNORMAL HIGH (ref 0.7–3.1)
Lymphs: 41 %
MCH: 27.1 pg (ref 26.6–33.0)
MCHC: 31 g/dL — ABNORMAL LOW (ref 31.5–35.7)
MCV: 87 fL (ref 79–97)
Monocytes Absolute: 0.5 x10E3/uL (ref 0.1–0.9)
Monocytes: 6 %
Neutrophils Absolute: 4.3 x10E3/uL (ref 1.4–7.0)
Neutrophils: 49 %
Platelets: 453 x10E3/uL — ABNORMAL HIGH (ref 150–450)
RBC: 4.94 x10E6/uL (ref 3.77–5.28)
RDW: 13.1 % (ref 11.7–15.4)
WBC: 8.6 x10E3/uL (ref 3.4–10.8)

## 2024-05-04 LAB — TSH: TSH: 3.33 u[IU]/mL (ref 0.450–4.500)

## 2024-05-04 LAB — HEMOGLOBIN A1C
Est. average glucose Bld gHb Est-mCnc: 108 mg/dL
Hgb A1c MFr Bld: 5.4 % (ref 4.8–5.6)

## 2024-05-04 LAB — LIPID PANEL
Chol/HDL Ratio: 5.7 ratio — ABNORMAL HIGH (ref 0.0–4.4)
Cholesterol, Total: 188 mg/dL (ref 100–199)
HDL: 33 mg/dL — ABNORMAL LOW (ref >39)
LDL Chol Calc (NIH): 135 mg/dL — ABNORMAL HIGH (ref 0–99)
Triglycerides: 108 mg/dL (ref 0–149)
VLDL Cholesterol Cal: 20 mg/dL (ref 5–40)

## 2024-05-04 LAB — VITAMIN D 25 HYDROXY (VIT D DEFICIENCY, FRACTURES): Vit D, 25-Hydroxy: 17.8 ng/mL — ABNORMAL LOW (ref 30.0–100.0)

## 2024-05-14 ENCOUNTER — Ambulatory Visit: Payer: Self-pay | Admitting: Family Medicine

## 2024-05-14 DIAGNOSIS — E559 Vitamin D deficiency, unspecified: Secondary | ICD-10-CM

## 2024-05-14 MED ORDER — VITAMIN D (ERGOCALCIFEROL) 1.25 MG (50000 UNIT) PO CAPS: 50000.0000 [IU] | ORAL_CAPSULE | ORAL | 1 refills | Status: DC

## 2024-06-08 ENCOUNTER — Other Ambulatory Visit: Payer: Self-pay | Admitting: Family Medicine

## 2024-06-08 DIAGNOSIS — I10 Essential (primary) hypertension: Secondary | ICD-10-CM

## 2024-06-13 ENCOUNTER — Ambulatory Visit: Payer: Self-pay | Admitting: Family Medicine

## 2024-06-14 ENCOUNTER — Ambulatory Visit: Payer: Self-pay

## 2024-06-14 NOTE — Telephone Encounter (Signed)
 Noted

## 2024-06-14 NOTE — Telephone Encounter (Signed)
 FYI Only or Action Required?: FYI only for provider.  Patient was last seen in primary care on 04/01/2024 by Zarwolo, [name], FNP.  Called Nurse Triage reporting Nausea.  Symptoms began several weeks ago.  Interventions attempted: Nothing.  Symptoms are: unchanged.  Triage Disposition: See PCP When Office is Open (Within 3 Days)  Patient/caregiver understands and will follow disposition?: Yes Reason for Disposition  Nausea lasts > 1 week  Answer Assessment - Initial Assessment Questions 3 days ago vision blurry for a couple seconds and goes back to normal. Having a hard time eating, feels nauseous but doesn't throw up. Feels like heart flutters when laying on left side. BC was just recently removed in arm due to numbness on that side. Hx of high blood pressure, compliant with medications. Advised patient to be seen today, no appointments with PCP but offered appointment at [facility], patient denied and stated she's worried about getting in trouble wit her job, and will go to the hospital later.   1. NAUSEA SEVERITY: How bad is the nausea? (e.g., mild, moderate, severe; dehydration, weight loss)     States its not dizziness, more like brain fog and feeling weak  2. ONSET: When did the nausea begin?     2 weeks  3. VOMITING: Any vomiting? If Yes, ask: How many times today?     No  4. CAUSE: What do you think is causing the nausea?     Unsure  5. PREGNANCY: Is there any chance you are pregnant? (e.g., unprotected intercourse, missed birth control pill, broken condom)     No  Protocols used: Nausea-A-AH  Copied from CRM [id number]. Topic: Clinical - Red Word Triage >> Jun 14, 2024 10:56 AM Tonda B wrote: [facility] that prompted transfer to Nurse Triage: patient is calling she has blurry vision she has all the symptoms of the flu but no vomiting it has been going on for about 2 weeks

## 2024-06-17 ENCOUNTER — Emergency Department (HOSPITAL_COMMUNITY)

## 2024-06-17 ENCOUNTER — Encounter (HOSPITAL_COMMUNITY): Payer: Self-pay | Admitting: Emergency Medicine

## 2024-06-17 ENCOUNTER — Emergency Department (HOSPITAL_COMMUNITY)
Admission: EM | Admit: 2024-06-17 | Discharge: 2024-06-17 | Disposition: A | Discharge status: Home / Self Care | Attending: Emergency Medicine | Admitting: Emergency Medicine

## 2024-06-17 ENCOUNTER — Other Ambulatory Visit: Payer: Self-pay

## 2024-06-17 DIAGNOSIS — K529 Noninfective gastroenteritis and colitis, unspecified: Secondary | ICD-10-CM | POA: Insufficient documentation

## 2024-06-17 DIAGNOSIS — R109 Unspecified abdominal pain: Secondary | ICD-10-CM

## 2024-06-17 DIAGNOSIS — Z91040 Latex allergy status: Secondary | ICD-10-CM | POA: Diagnosis not present

## 2024-06-17 DIAGNOSIS — R1032 Left lower quadrant pain: Secondary | ICD-10-CM | POA: Diagnosis present

## 2024-06-17 DIAGNOSIS — R110 Nausea: Secondary | ICD-10-CM

## 2024-06-17 DIAGNOSIS — N83202 Unspecified ovarian cyst, left side: Secondary | ICD-10-CM | POA: Diagnosis not present

## 2024-06-17 LAB — CBC
HCT: 40.5 % (ref 36.0–46.0)
Hemoglobin: 12.7 g/dL (ref 12.0–15.0)
MCH: 27.3 pg (ref 26.0–34.0)
MCHC: 31.4 g/dL (ref 30.0–36.0)
MCV: 86.9 fL (ref 80.0–100.0)
Platelets: 429 K/uL — ABNORMAL HIGH (ref 150–400)
RBC: 4.66 MIL/uL (ref 3.87–5.11)
RDW: 13.8 % (ref 11.5–15.5)
WBC: 7.7 K/uL (ref 4.0–10.5)
nRBC: 0 % (ref 0.0–0.2)

## 2024-06-17 LAB — COMPREHENSIVE METABOLIC PANEL WITH GFR
ALT: 23 U/L (ref 0–44)
AST: 18 U/L (ref 15–41)
Albumin: 3.6 g/dL (ref 3.5–5.0)
Alkaline Phosphatase: 49 U/L (ref 38–126)
Anion gap: 11 (ref 5–15)
BUN: 18 mg/dL (ref 6–20)
CO2: 20 mmol/L — ABNORMAL LOW (ref 22–32)
Calcium: 8.7 mg/dL — ABNORMAL LOW (ref 8.9–10.3)
Chloride: 108 mmol/L (ref 98–111)
Creatinine, Ser: 0.72 mg/dL (ref 0.44–1.00)
GFR, Estimated: >60 mL/min (ref >60)
Glucose, Bld: 93 mg/dL (ref 70–99)
Potassium: 4.2 mmol/L (ref 3.5–5.1)
Sodium: 139 mmol/L (ref 135–145)
Total Bilirubin: 0.5 mg/dL (ref 0.0–1.2)
Total Protein: 7.1 g/dL (ref 6.5–8.1)

## 2024-06-17 LAB — URINALYSIS, ROUTINE W REFLEX MICROSCOPIC
Bilirubin Urine: NEGATIVE
Glucose, UA: NEGATIVE mg/dL
Hgb urine dipstick: NEGATIVE
Ketones, ur: NEGATIVE mg/dL
Leukocytes,Ua: NEGATIVE
Nitrite: NEGATIVE
Protein, ur: NEGATIVE mg/dL
Specific Gravity, Urine: 1.028 (ref 1.005–1.030)
pH: 5 (ref 5.0–8.0)

## 2024-06-17 LAB — PREGNANCY, URINE: Preg Test, Ur: NEGATIVE

## 2024-06-17 MED ORDER — ONDANSETRON HCL 4 MG/2ML IJ SOLN
4.0000 mg | Freq: Once | INTRAMUSCULAR | Status: AC
  Administered 2024-06-17: 4 mg via INTRAVENOUS
  Filled 2024-06-17: qty 2

## 2024-06-17 MED ORDER — ONDANSETRON 8 MG PO TBDP: 8.0000 mg | ORAL_TABLET | Freq: Three times a day (TID) | ORAL | 0 refills | Status: DC | PRN

## 2024-06-17 MED ORDER — LACTATED RINGERS IV BOLUS
1000.0000 mL | Freq: Once | INTRAVENOUS | Status: AC
Start: 2024-06-17 — End: 2024-06-17
  Administered 2024-06-17: 1000 mL via INTRAVENOUS

## 2024-06-17 MED ORDER — IOHEXOL 300 MG/ML  SOLN
100.0000 mL | Freq: Once | INTRAMUSCULAR | Status: AC | PRN
  Administered 2024-06-17: 100 mL via INTRAVENOUS

## 2024-06-17 MED ORDER — AMOXICILLIN-POT CLAVULANATE 875-125 MG PO TABS
1.0000 | ORAL_TABLET | Freq: Once | ORAL | Status: AC
Start: 2024-06-17 — End: 2024-06-17
  Administered 2024-06-17: 1 via ORAL
  Filled 2024-06-17: qty 1

## 2024-06-17 MED ORDER — AMOXICILLIN-POT CLAVULANATE 875-125 MG PO TABS: 1.0000 | ORAL_TABLET | Freq: Two times a day (BID) | ORAL | 0 refills | Status: DC

## 2024-06-17 NOTE — Discharge Instructions (Addendum)
 It was our pleasure to provide your ER care today - we hope that you feel better.  Drink plenty of fluids/stay well hydrated. Take zofran  as need if nauseated. Take antibiotic as prescribed.   For left ovarian cyst, follow up with your ob/gyn doctor in the next couple months.   Return to ER if worse, new symptoms, fevers, new or worsening or severe abdominal pain, persistent vomiting, chest pain, trouble breathing, fainting, or other concern.

## 2024-06-17 NOTE — ED Triage Notes (Addendum)
 Pt c/o of feeling dizzy, nausea and poor appetite x2 weeks. Also states she has lower abd pressure when ambulating.

## 2024-06-17 NOTE — ED Provider Notes (Signed)
 [place] EMERGENCY DEPARTMENT AT [facility] Provider Note   CSN: [id number] Arrival date & time: 06/17/24  9268     Patient presents with: Nausea   Bethany Gomez is a 33 y.o. female.   Pt with c/o nausea and LLQ pain in past 1-2 weeks. Symptoms waxing and waning, without specific or consistent exacerbating or alleviating factors. No vomiting. Did have 1-2 loose stools. No dysuria or hematuria. No vaginal discharge or bleeding. Last period a few weeks ago. Denies back/flank pain. No hx diverticulitis. No hx renal stones. ?remote hx ovarian cyst. No fever or chills.   The history is provided by the patient and medical records.       Prior to Admission medications   Medication Sig Start Date End Date Taking? Authorizing Provider  amLODipine  (NORVASC ) 10 MG tablet Take 1 tablet by mouth once daily 04/08/24   Zarwolo, [name], FNP  ASHWAGANDHA PO Take by mouth.    [provider]  cetirizine  (ZYRTEC ) 10 MG tablet Take 1 tablet (10 mg total) by mouth daily. 02/07/23   Zarwolo, [name], FNP  cholecalciferol (VITAMIN D3) 25 MCG (1000 UNIT) tablet Take 1,000 Units by mouth daily.    [provider]  Cyanocobalamin (VITAMIN B-12 PO) Take by mouth.    [provider]  cyclobenzaprine  (FLEXERIL ) 5 MG tablet Take 1 tablet (5 mg total) by mouth 3 (three) times daily as needed for muscle spasms. 04/01/24   Zarwolo, [name], FNP  losartan -hydrochlorothiazide (HYZAAR) 50-12.5 MG tablet Take 1 tablet by mouth once daily 06/10/24   Zarwolo, [name], FNP  Norethindrone -Ethinyl Estradiol-Fe Biphas (LO LOESTRIN FE ) 1 MG-10 MCG / 10 MCG tablet Take 1 tablet by mouth daily. Take 1 daily by mouth 04/23/24   Signa Nest A, NP  Vitamin D , Ergocalciferol , (DRISDOL ) 1.25 MG (50000 UNIT) CAPS capsule Take 1 capsule (50,000 Units total) by mouth every 7 (seven) days. 05/14/24   Zarwolo, [name], FNP    Allergies: Latex    Review of Systems  Constitutional:  Negative for chills  and fever.  HENT:  Negative for sore throat.   Respiratory:  Negative for cough and shortness of breath.   Cardiovascular:  Negative for chest pain.  Gastrointestinal:  Positive for abdominal pain and nausea.  Genitourinary:  Negative for dysuria, flank pain, vaginal bleeding and vaginal discharge.  Musculoskeletal:  Negative for back pain and neck pain.  Skin:  Negative for rash.  Neurological:  Negative for headaches.    Updated Vital Signs BP (!) 140/91 (BP Location: Right Arm)   Pulse 80   Temp 98.1 F (36.7 C) (Oral)   Resp 18   Ht 1.6 m (5' 3)   Wt 90.7 kg   LMP 05/27/2024 (Exact Date)   SpO2 100%   BMI 35.43 kg/m   Physical Exam Vitals and nursing note reviewed.  Constitutional:      Appearance: Normal appearance. She is well-developed.  HENT:     Head: Atraumatic.     Nose: Nose normal.     Mouth/Throat:     Mouth: Mucous membranes are moist.     Pharynx: Oropharynx is clear. No oropharyngeal exudate or posterior oropharyngeal erythema.  Eyes:     General: No scleral icterus.    Conjunctiva/sclera: Conjunctivae normal.  Neck:     Trachea: No tracheal deviation.  Cardiovascular:     Rate and Rhythm: Normal rate and regular rhythm.     Pulses: Normal pulses.     Heart sounds: Normal heart  sounds. No murmur heard.    No friction rub. No gallop.  Pulmonary:     Effort: Pulmonary effort is normal. No respiratory distress.     Breath sounds: Normal breath sounds.  Abdominal:     General: Bowel sounds are normal. There is no distension.     Palpations: Abdomen is soft. There is no mass.     Tenderness: There is no abdominal tenderness (LLQ tenderness, mild.). There is no guarding or rebound.     Hernia: No hernia is present.  Genitourinary:    Comments: No cva tenderness.  Musculoskeletal:        General: No swelling or tenderness.     Cervical back: Normal range of motion and neck supple. No rigidity. No muscular tenderness.     Right lower leg: No edema.      Left lower leg: No edema.  Skin:    General: Skin is warm and dry.     Findings: No rash.  Neurological:     Mental Status: She is alert.     Comments: Alert, speech normal. Motor/sens grossly intact bil. No focal deficit noted. Pt ambulates w steady gait, no ataxia, no faintness.   Psychiatric:        Mood and Affect: Mood normal.     (all labs ordered are listed, but only abnormal results are displayed) Results for orders placed or performed during the hospital encounter of 06/17/24  CBC   Collection Time: 06/17/24  8:05 AM  Result Value Ref Range   WBC 7.7 4.0 - 10.5 K/uL   RBC 4.66 3.87 - 5.11 MIL/uL   Hemoglobin 12.7 12.0 - 15.0 g/dL   HCT 59.4 63.9 - 53.9 %   MCV 86.9 80.0 - 100.0 fL   MCH 27.3 26.0 - 34.0 pg   MCHC 31.4 30.0 - 36.0 g/dL   RDW 86.1 88.4 - 84.4 %   Platelets 429 (H) 150 - 400 K/uL   nRBC 0.0 0.0 - 0.2 %  Comprehensive metabolic panel with GFR   Collection Time: 06/17/24  8:05 AM  Result Value Ref Range   Sodium 139 135 - 145 mmol/L   Potassium 4.2 3.5 - 5.1 mmol/L   Chloride 108 98 - 111 mmol/L   CO2 20 (L) 22 - 32 mmol/L   Glucose, Bld 93 70 - 99 mg/dL   BUN 18 6 - 20 mg/dL   Creatinine, Ser 9.27 0.44 - 1.00 mg/dL   Calcium 8.7 (L) 8.9 - 10.3 mg/dL   Total Protein 7.1 6.5 - 8.1 g/dL   Albumin 3.6 3.5 - 5.0 g/dL   AST 18 15 - 41 U/L   ALT 23 0 - 44 U/L   Alkaline Phosphatase 49 38 - 126 U/L   Total Bilirubin 0.5 0.0 - 1.2 mg/dL   GFR, Estimated >39 >39 mL/min   Anion gap 11 5 - 15  Urinalysis, Routine w reflex microscopic -Urine, Clean Catch   Collection Time: 06/17/24  8:05 AM  Result Value Ref Range   Color, Urine YELLOW YELLOW   APPearance CLEAR CLEAR   Specific Gravity, Urine 1.028 1.005 - 1.030   pH 5.0 5.0 - 8.0   Glucose, UA NEGATIVE NEGATIVE mg/dL   Hgb urine dipstick NEGATIVE NEGATIVE   Bilirubin Urine NEGATIVE NEGATIVE   Ketones, ur NEGATIVE NEGATIVE mg/dL   Protein, ur NEGATIVE NEGATIVE mg/dL   Nitrite NEGATIVE NEGATIVE    Leukocytes,Ua NEGATIVE NEGATIVE  Pregnancy, urine   Collection Time: 06/17/24  8:05 AM  Result Value Ref Range   Preg Test, Ur NEGATIVE NEGATIVE     EKG: None  Radiology: US  PELVIC COMPLETE W TRANSVAGINAL AND TORSION R/O Result Date: 06/17/2024 CLINICAL DATA:  Left lower quadrant pain when ambulating 2 weeks. EXAM: TRANSABDOMINAL AND TRANSVAGINAL ULTRASOUND OF PELVIS DOPPLER ULTRASOUND OF OVARIES TECHNIQUE: Both transabdominal and transvaginal ultrasound examinations of the pelvis were performed. Transabdominal technique was performed for global imaging of the pelvis including uterus, ovaries, adnexal regions, and pelvic cul-de-sac. It was necessary to proceed with endovaginal exam following the transabdominal exam to visualize the endometrium and ovaries. Color and duplex Doppler ultrasound was utilized to evaluate blood flow to the ovaries. COMPARISON:  CT abdomen/pelvis today, pelvic ultrasound 01/03/2023. FINDINGS: Uterus Measurements: 7.9 x 4.7 x 4.8 cm = volume: 93 mL. No fibroids or other mass visualized. Endometrium Thickness: 6 mm.  No focal abnormality visualized. Right ovary Measurements: 3.0 x 1.7 x 2.0 cm = volume: 5.1 mL. Normal appearance/no adnexal mass. Left ovary Measurements: 4.4 x 3.3 x 3.0 cm = volume: 23.1 mL. 3.2 cm cyst, likely follicular cyst as this correlates to the CT finding. No follow-up imaging is recommended. Pulsed Doppler evaluation of both ovaries demonstrates normal low-resistance arterial and venous waveforms. Other findings Small amount of free pelvic fluid likely physiologic. IMPRESSION: 1. Normal uterus and endometrium. 2. 3.2 cm left ovarian cyst, likely follicular cyst. No follow-up imaging recommended. Note: This recommendation does not apply to premenarchal patients or to those with increased risk (genetic, family history, elevated tumor markers or other high-risk factors) of ovarian cancer. Reference: Radiology 2019 Nov; 293(2):359-371. 3. No evidence of  ovarian torsion. Electronically Signed   By: Toribio Agreste M.D.   On: 06/17/2024 11:56   CT ABDOMEN PELVIS W CONTRAST Result Date: 06/17/2024 CLINICAL DATA:  Abdominal pain, acute, nonlocalized left abd pain EXAM: CT ABDOMEN AND PELVIS WITH CONTRAST TECHNIQUE: Multidetector CT imaging of the abdomen and pelvis was performed using the standard protocol following bolus administration of intravenous contrast. RADIATION DOSE REDUCTION: This exam was performed according to the departmental dose-optimization program which includes automated exposure control, adjustment of the mA and/or kV according to patient size and/or use of iterative reconstruction technique. CONTRAST:  OMNIPAQUE  IOHEXOL  300 MG/ML  SOLN COMPARISON:  01/03/2023 pelvic sonogram. FINDINGS: Lower chest: No significant pulmonary nodules or acute consolidative airspace disease. Hepatobiliary: Normal liver signs. Subcentimeter hypodense anterior liver lesion is too small to characterize on image 28. No additional liver lesions. Normal gallbladder with no radiopaque cholelithiasis. No biliary ductal dilatation. Pancreas: Normal, with no mass or duct dilation. Spleen: Normal size. No mass. Adrenals/Urinary Tract: Normal adrenals.  Normal bladder. Stomach/Bowel: Normal non-distended stomach. No dilated small bowel loops. Mild wall thickening throughout several mid to distal small bowel loops in the central and right abdomen. Normal appendix. Normal large bowel with no diverticulosis, large bowel wall thickening or pericolonic fat stranding. Vascular/Lymphatic: Atherosclerotic nonaneurysmal abdominal aorta. Patent portal, splenic, hepatic and renal veins. No pathologically enlarged lymph nodes in the abdomen or pelvis. Reproductive: Hypodense 3.1 cm left adnexal lesion on series 2/image 71. No right adnexal mass. Mildly heterogeneous and slightly irregular uterus, cannot exclude small uterine fibroids. Other: No pneumoperitoneum. No focal fluid  collection. Small volume simple free fluid in the pelvic cul-de-sac. Musculoskeletal: No aggressive appearing focal osseous lesions. IMPRESSION: 1. Mild wall thickening throughout several mid to distal small bowel loops, suggesting mild nonspecific infectious or inflammatory enteritis. No bowel obstruction. 2. Hypodense 3.1 cm left adnexal lesion, indeterminate. Recommend further  evaluation with pelvic ultrasound. 3. Mildly heterogeneous and slightly irregular uterus, cannot exclude small uterine fibroids. 4. Small volume simple free fluid in the pelvic cul-de-sac. 5.  Aortic Atherosclerosis (ICD10-I70.0). Electronically Signed   By: Selinda DELENA Blue M.D.   On: 06/17/2024 09:59     Procedures   Medications Ordered in the ED  amoxicillin -clavulanate (AUGMENTIN ) 875-125 MG per tablet 1 tablet (has no administration in time range)  lactated ringers  bolus 1,000 mL (1,000 mLs Intravenous Bolus 06/17/24 0819)  ondansetron  (ZOFRAN ) injection 4 mg (4 mg Intravenous Given 06/17/24 0820)  iohexol  (OMNIPAQUE ) 300 MG/ML solution 100 mL (100 mLs Intravenous Contrast Given 06/17/24 0924)                                    Medical Decision Making Problems Addressed: Abdominal pain, acute: acute illness or injury with systemic symptoms that poses a threat to life or bodily functions Enteritis: acute illness or injury with systemic symptoms Left ovarian cyst: chronic illness or injury Nausea: acute illness or injury with systemic symptoms  Amount and/or Complexity of Data Reviewed Independent Historian:     Details: Family, hx External Data Reviewed: notes. Labs: ordered. Decision-making details documented in ED Course. Radiology: ordered and independent interpretation performed. Decision-making details documented in ED Course.  Risk Prescription drug management. Decision regarding hospitalization.   Iv ns. LR bolus.  Labs ordered/sent. Imaging ordered.   Differential diagnosis includes diverticulitis,  uti, ureteral stone, etc. Dispo decision including potential need for admission considered - will get labs and imaging and reassess.   Reviewed nursing notes and prior charts for additional history. External reports reviewed. Additional history from: family.   Labs reviewed/interpreted by me - wbc and hgb normal. Chem largely unremarkable. Ua neg for uti. Preg neg.   CT reviewed/interpreted by me - enteritis. Left ovarian cyst.  U/s reviewed/interpreted by me - ovarian cyst, no torsion.  Discussed imaging w pt.   Augmentin  po.  Rx provided.   Pt comfortable, no abd pain or nv. Pt currently appears stable for ED d/c.   Rec close pcp f/u and /b/gyn f/u.  Return precautions provided.       Final diagnoses:  None    ED Discharge Orders     None          Bernard Drivers, MD 06/17/24 1413

## 2024-06-24 ENCOUNTER — Encounter: Payer: Self-pay | Admitting: Family Medicine

## 2024-06-24 ENCOUNTER — Ambulatory Visit: Payer: Self-pay

## 2024-06-24 ENCOUNTER — Ambulatory Visit: Admitting: Family Medicine

## 2024-06-24 VITALS — BP 138/86 | HR 83 | Temp 98.5°F | Ht 63.0 in | Wt 197.0 lb

## 2024-06-24 DIAGNOSIS — R6881 Early satiety: Secondary | ICD-10-CM | POA: Diagnosis not present

## 2024-06-24 DIAGNOSIS — R1013 Epigastric pain: Secondary | ICD-10-CM

## 2024-06-24 LAB — LIPASE: Lipase: 77 U/L — ABNORMAL HIGH (ref 7–60)

## 2024-06-24 MED ORDER — PANTOPRAZOLE SODIUM 40 MG PO TBEC: 40.0000 mg | DELAYED_RELEASE_TABLET | Freq: Every day | ORAL | 3 refills | Status: DC

## 2024-06-24 NOTE — Progress Notes (Signed)
 Subjective:    Patient ID: Bethany Gomez, female    DOB: 12-01-1990, 33 y.o.   MRN: [id number]  Back Pain   Patient is a very sweet 33 year old African-American female who presents with 3 weeks of early satiety.  She states that she feels bloated and full all the time.  She feels like she cannot eat anything else.  Now she is having pain radiating into her back.  The pain is located in the epigastric area and is radiating into her middle of her back.  The back pain is not exacerbated by movement.  She does not feel that it is her back at all.  Seems to be tied to her stomach.  She was dealing with constipation however recently she went to the emergency room and was prescribed an antibiotic for enteritis.  The antibiotic has her going to the bathroom now.  Her nausea that caused her to go to the emergency room has improved since she started taking the antibiotic and also since she started having regular bowel movements however the early satiety the bloating and the back pain persist.  Urine pregnancy test was negative.  CT scan showed mild enteritis and a 3 cm left adnexal mass as.  She has an appointment to see her gynecologist in October.  Otherwise CT scan and lab work were unremarkable. Past Medical History:  Diagnosis Date   Headaches, cluster    History of gestational hypertension 02/09/2017   History of prior pregnancy with IUGR newborn 03/20/2017   3lb14oz @ 37wks      Hypertension    Mono exposure    Premenstrual dysphoric disorder    UTI (lower urinary tract infection)    Past Surgical History:  Procedure Laterality Date   NO PAST SURGERIES     WISDOM TOOTH EXTRACTION Bilateral    Current Outpatient Medications on File Prior to Visit  Medication Sig Dispense Refill   amLODipine  (NORVASC ) 10 MG tablet Take 1 tablet by mouth once daily 180 tablet 0   amoxicillin -clavulanate (AUGMENTIN ) 875-125 MG tablet Take 1 tablet by mouth every 12 (twelve) hours. 10 tablet 0   ASHWAGANDHA PO  Take by mouth.     cetirizine  (ZYRTEC ) 10 MG tablet Take 1 tablet (10 mg total) by mouth daily. 30 tablet 5   cholecalciferol (VITAMIN D3) 25 MCG (1000 UNIT) tablet Take 1,000 Units by mouth daily.     Cyanocobalamin (VITAMIN B-12 PO) Take by mouth.     cyclobenzaprine  (FLEXERIL ) 5 MG tablet Take 1 tablet (5 mg total) by mouth 3 (three) times daily as needed for muscle spasms. 30 tablet 1   losartan -hydrochlorothiazide (HYZAAR) 50-12.5 MG tablet Take 1 tablet by mouth once daily 180 tablet 0   Norethindrone -Ethinyl Estradiol-Fe Biphas (LO LOESTRIN FE ) 1 MG-10 MCG / 10 MCG tablet Take 1 tablet by mouth daily. Take 1 daily by mouth 84 tablet 4   ondansetron  (ZOFRAN -ODT) 8 MG disintegrating tablet Take 1 tablet (8 mg total) by mouth every 8 (eight) hours as needed for nausea or vomiting. 10 tablet 0   Vitamin D , Ergocalciferol , (DRISDOL ) 1.25 MG (50000 UNIT) CAPS capsule Take 1 capsule (50,000 Units total) by mouth every 7 (seven) days. 27 capsule 1   No current facility-administered medications on file prior to visit.   Allergies  Allergen Reactions   Latex Hives, Itching and Other (See Comments)    Reaction:  Burning   Social History   Socioeconomic History   Marital status: Single  Spouse name: Not on file   Number of children: 1   Years of education: Not on file   Highest education level: Some college, no degree  Occupational History   Occupation: Mail order pharmacist    Comment: [employer]  Tobacco Use   Smoking status: Every Day    Current packs/day: 0.50    Average packs/day: 0.5 packs/day for 5.0 years (2.5 ttl pk-yrs)    Types: Cigarettes   Smokeless tobacco: Never  Vaping Use   Vaping status: Never Used  Substance and Sexual Activity   Alcohol use: Yes    Comment: rarely in social setting; maybe once per month   Drug use: Yes    Types: Marijuana    Comment: socially   Sexual activity: Not Currently    Birth control/protection: Condom  Other Topics Concern   Not  on file  Social History Narrative   Lives with her child, not currently.    Social Drivers of Corporate investment banker Strain: Low Risk  (12/22/2022)   Overall Financial Resource Strain (CARDIA)    Difficulty of Paying Living Expenses: Not very hard  Food Insecurity: No Food Insecurity (12/22/2022)   Hunger Vital Sign    Worried About Running Out of Food in the Last Year: Never true    Ran Out of Food in the Last Year: Never true  Transportation Needs: No Transportation Needs (12/22/2022)   PRAPARE - Administrator, Civil Service (Medical): No    Lack of Transportation (Non-Medical): No  Recent Concern: Transportation Needs - Unmet Transportation Needs (10/18/2022)   PRAPARE - Transportation    Lack of Transportation (Medical): Yes    Lack of Transportation (Non-Medical): Yes  Physical Activity: Insufficiently Active (12/22/2022)   Exercise Vital Sign    Days of Exercise per Week: 4 days    Minutes of Exercise per Session: 30 min  Stress: Stress Concern Present (12/22/2022)   [employer] of Occupational Health - Occupational Stress Questionnaire    Feeling of Stress : To some extent  Social Connections: Socially Isolated (12/22/2022)   Social Connection and Isolation Panel    Frequency of Communication with Friends and Family: More than three times a week    Frequency of Social Gatherings with Friends and Family: Once a week    Attends Religious Services: Never    Database administrator or Organizations: No    Attends Engineer, structural: Not on file    Marital Status: Never married  Intimate Partner Violence: Not At Risk (10/18/2022)   Humiliation, Afraid, Rape, and Kick questionnaire    Fear of Current or Ex-Partner: No    Emotionally Abused: No    Physically Abused: No    Sexually Abused: No      Review of Systems  Musculoskeletal:  Positive for back pain.  All other systems reviewed and are negative.      Objective:   Physical  Exam Constitutional:      Appearance: Normal appearance. She is obese. She is not ill-appearing or toxic-appearing.  Cardiovascular:     Rate and Rhythm: Normal rate and regular rhythm.     Pulses: Normal pulses.     Heart sounds: Normal heart sounds.  Pulmonary:     Effort: Pulmonary effort is normal. No respiratory distress.     Breath sounds: Normal breath sounds. No wheezing or rales.  Abdominal:     General: Bowel sounds are normal. There is no distension.  Palpations: Abdomen is soft.     Tenderness: There is no abdominal tenderness. There is no guarding or rebound.  Musculoskeletal:     Lumbar back: Tenderness present. No spasms or bony tenderness. Normal range of motion.  Neurological:     Mental Status: She is alert.           Assessment & Plan:  Epigastric pain - Plan: Lipase  Early satiety Patient has epigastric abdominal discomfort, bloating, and early satiety.  Differential diagnosis includes peptic ulcer disease, gastritis, gastroparesis, biliary dyskinesia, IBS with constipation.  She has requested a GI consultation which I believe is appropriate given that this is going on for several weeks.  Meanwhile I will empirically start her on pantoprazole 40 mg a day to cover possible gastritis or peptic ulcer disease.  Also recommended starting MiraLAX in case constipation is causing the bloating and the back pain and early satiety.  Consider Linzess if the MiraLAX is helpful.  Check lipase given the fact she is having epigastric abdominal pain.  Patient denies any alcohol use.

## 2024-06-24 NOTE — Telephone Encounter (Signed)
 FYI Only or Action Required?: FYI only for provider.  Patient was last seen in primary care on 04/01/2024 by Bethany Gomez, [name], FNP.  Called Nurse Triage reporting Back Pain.  Symptoms began yesterday.  Interventions attempted: Nothing.  Symptoms are: gradually worsening.  Triage Disposition: See PCP When Office is Open (Within 3 Days)  Patient/caregiver understands and will follow disposition?: Yes  **Appt. Scheduled for 9/29**       Copied from CRM #[id number]. Topic: Clinical - Red Word Triage >> Jun 24, 2024  9:13 AM Larissa RAMAN wrote: [facility] that prompted transfer to Nurse Triage: back pain    ----------------------------------------------------------------------- From previous Reason for Contact - Cancel/Reschedule: Patient/patient representative is calling to cancel or reschedule an appointment. Refer to attachments for appointment information. Reason for Disposition  [1] MODERATE back pain (e.g., interferes with normal activities) AND [2] present > 3 days  Answer Assessment - Initial Assessment Questions 1. ONSET: When did the pain begin? (e.g., minutes, hours, days)     X 1 day   2. LOCATION: Where does it hurt? (upper, mid or lower back)     Mid, and lower   3. SEVERITY: How bad is the pain?  (e.g., Scale 1-10; mild, moderate, or severe)     6/10   4. PATTERN: Is the pain constant? (e.g., yes, no; constant, intermittent)      Constant   5. RADIATION: Does the pain shoot into your legs or somewhere else?     No   6. CAUSE:  What do you think is causing the back pain?      Unknown   7. BACK OVERUSE:  Any recent lifting of heavy objects, strenuous work or exercise?     No   8. MEDICINES: What have you taken so far for the pain? (e.g., nothing, acetaminophen , NSAIDS)     No   9. NEUROLOGIC SYMPTOMS: Do you have any weakness, numbness, or problems with bowel/bladder control?     No   10. OTHER SYMPTOMS: Do you have any other symptoms?  (e.g., fever, abdomen pain, burning with urination, blood in urine)       Abdomen pain   11. PREGNANCY: Is there any chance you are pregnant? When was your last menstrual period?       No  Was seen in ED last week for back pain, was dx. With inflamed intestine. Currently taking amoxicillin ; no relief noted. Appt. Scheduled for 9/29 at [facility] location, she also scheduled a hospital follow up visit for 10/8.  Protocols used: Back Pain-A-AH

## 2024-06-25 ENCOUNTER — Telehealth: Payer: Self-pay

## 2024-06-25 ENCOUNTER — Ambulatory Visit: Payer: Self-pay | Admitting: Family Medicine

## 2024-06-25 NOTE — Telephone Encounter (Signed)
 Copied from CRM #[id number]. Topic: Clinical - Medical Advice >> Jun 25, 2024  3:51 PM Amy B wrote: Reason for CRM: Patient requests a call back to discuss her results followup.  She was seen in the office yesterday.  She states she has some questions.

## 2024-06-25 NOTE — Telephone Encounter (Signed)
 Copied from CRM #[id number]. Topic: Referral - Request for Referral >> Jun 25, 2024 11:42 AM Winona SAUNDERS wrote: Dr. Duanne has requested a referral for Gastroenterology and pt is interested in going to [facility]

## 2024-06-27 ENCOUNTER — Ambulatory Visit: Admitting: Family Medicine

## 2024-06-27 ENCOUNTER — Encounter: Payer: Self-pay | Admitting: Family Medicine

## 2024-06-27 ENCOUNTER — Encounter: Payer: Self-pay | Admitting: Gastroenterology

## 2024-06-27 VITALS — BP 138/83 | HR 103 | Resp 16 | Ht 63.5 in | Wt 197.1 lb

## 2024-06-27 DIAGNOSIS — R1013 Epigastric pain: Secondary | ICD-10-CM | POA: Diagnosis not present

## 2024-06-27 DIAGNOSIS — R058 Other specified cough: Secondary | ICD-10-CM

## 2024-06-27 MED ORDER — CETIRIZINE HCL 10 MG PO TABS: 10.0000 mg | ORAL_TABLET | Freq: Every day | ORAL | 5 refills | Status: DC

## 2024-06-27 NOTE — Patient Instructions (Addendum)
 I appreciate the opportunity to provide care to you today!  For a Healthier YOU, I Recommend: Reducing your intake of sugar, sodium, carbohydrates, and saturated fats. Increasing your fiber intake by incorporating more whole grains, fruits, and vegetables into your meals. Setting healthy goals with a focus on lowering your consumption of carbs, sugar, and unhealthy fats. Adding variety to your diet by including a wide range of fruits and vegetables. Cutting back on soda and limiting processed foods as much as possible. Staying active: In addition to taking your weight loss medication, aim for at least 150 minutes of moderate-intensity physical activity each week for optimal results.    Please follow up if your symptoms worsen or fail to improve.     Attached with your AVS, you will find valuable resources for self-education. I highly recommend dedicating some time to thoroughly examine them.   Please continue to a heart-healthy diet and increase your physical activities. Try to exercise for at least five days a week.    It was a pleasure to see you and I look forward to continuing to work together on your health and well-being. Please do not hesitate to call the office if you need care or have questions about your care.  In case of emergency, please visit the Emergency Department for urgent care, or contact our clinic at [phone] to schedule an appointment. We're here to help you!   Have a wonderful day and week. With Gratitude, Cammeron Greis MSN, FNP-BC

## 2024-06-27 NOTE — Progress Notes (Signed)
 Acute Office Visit  Subjective:    Patient ID: Bethany Gomez, female    DOB: 21-Sep-1991, 33 y.o.   MRN: [id number]  Chief Complaint  Patient presents with   Follow-up    Follow up form seeing Dr. Duanne on 09/29 for epigastric pain     HPI Patient is here today for a follow-up of epigastric pain. She has an appointment with GI on July 11, 2024. She reports that her symptoms have not subsided. No pain reported today.    Past Medical History:  Diagnosis Date   Headaches, cluster    History of gestational hypertension 02/09/2017   History of prior pregnancy with IUGR newborn 03/20/2017   3lb14oz @ 37wks      Hypertension    Mono exposure    Premenstrual dysphoric disorder    UTI (lower urinary tract infection)     Past Surgical History:  Procedure Laterality Date   NO PAST SURGERIES     WISDOM TOOTH EXTRACTION Bilateral     Family History  Problem Relation Age of Onset   Diabetes Mother    Fibromyalgia Mother    Hypertension Mother    Congestive Heart Failure Father    Narcolepsy Father    Aneurysm Father    Congestive Heart Failure Paternal Uncle    Diabetes Maternal Grandmother    Congestive Heart Failure Maternal Grandmother    Diabetes Maternal Grandfather    Congestive Heart Failure Maternal Grandfather    Diabetes Paternal Grandmother    Diabetes Paternal Grandfather    Congestive Heart Failure Paternal Grandfather    Colon cancer Neg Hx    Breast cancer Neg Hx     Social History   Socioeconomic History   Marital status: Single    Spouse name: Not on file   Number of children: 1   Years of education: Not on file   Highest education level: Some college, no degree  Occupational History   Occupation: Scientist, product/process development    Comment: [employer]  Tobacco Use   Smoking status: Every Day    Current packs/day: 0.50    Average packs/day: 0.5 packs/day for 5.0 years (2.5 ttl pk-yrs)    Types: Cigarettes   Smokeless tobacco: Never  Vaping Use    Vaping status: Never Used  Substance and Sexual Activity   Alcohol use: Yes    Comment: rarely in social setting; maybe once per month   Drug use: Yes    Types: Marijuana    Comment: socially   Sexual activity: Not Currently    Birth control/protection: Condom  Other Topics Concern   Not on file  Social History Narrative   Lives with her child, not currently.    Social Drivers of Corporate investment banker Strain: Low Risk  (12/22/2022)   Overall Financial Resource Strain (CARDIA)    Difficulty of Paying Living Expenses: Not very hard  Food Insecurity: No Food Insecurity (12/22/2022)   Hunger Vital Sign    Worried About Running Out of Food in the Last Year: Never true    Ran Out of Food in the Last Year: Never true  Transportation Needs: No Transportation Needs (12/22/2022)   PRAPARE - Administrator, Civil Service (Medical): No    Lack of Transportation (Non-Medical): No  Recent Concern: Transportation Needs - Unmet Transportation Needs (10/18/2022)   PRAPARE - Administrator, Civil Service (Medical): Yes    Lack of Transportation (Non-Medical): Yes  Physical Activity: Insufficiently  Active (12/22/2022)   Exercise Vital Sign    Days of Exercise per Week: 4 days    Minutes of Exercise per Session: 30 min  Stress: Stress Concern Present (12/22/2022)   [employer] of Occupational Health - Occupational Stress Questionnaire    Feeling of Stress : To some extent  Social Connections: Socially Isolated (12/22/2022)   Social Connection and Isolation Panel    Frequency of Communication with Friends and Family: More than three times a week    Frequency of Social Gatherings with Friends and Family: Once a week    Attends Religious Services: Never    Database administrator or Organizations: No    Attends Engineer, structural: Not on file    Marital Status: Never married  Intimate Partner Violence: Not At Risk (10/18/2022)   Humiliation, Afraid, Rape,  and Kick questionnaire    Fear of Current or Ex-Partner: No    Emotionally Abused: No    Physically Abused: No    Sexually Abused: No    Outpatient Medications Prior to Visit  Medication Sig Dispense Refill   amLODipine  (NORVASC ) 10 MG tablet Take 1 tablet by mouth once daily 180 tablet 0   amoxicillin -clavulanate (AUGMENTIN ) 875-125 MG tablet Take 1 tablet by mouth every 12 (twelve) hours. 10 tablet 0   ASHWAGANDHA PO Take by mouth.     cholecalciferol (VITAMIN D3) 25 MCG (1000 UNIT) tablet Take 1,000 Units by mouth daily.     Cyanocobalamin (VITAMIN B-12 PO) Take by mouth.     cyclobenzaprine  (FLEXERIL ) 5 MG tablet Take 1 tablet (5 mg total) by mouth 3 (three) times daily as needed for muscle spasms. 30 tablet 1   losartan -hydrochlorothiazide (HYZAAR) 50-12.5 MG tablet Take 1 tablet by mouth once daily 180 tablet 0   Norethindrone -Ethinyl Estradiol-Fe Biphas (LO LOESTRIN FE ) 1 MG-10 MCG / 10 MCG tablet Take 1 tablet by mouth daily. Take 1 daily by mouth 84 tablet 4   ondansetron  (ZOFRAN -ODT) 8 MG disintegrating tablet Take 1 tablet (8 mg total) by mouth every 8 (eight) hours as needed for nausea or vomiting. 10 tablet 0   pantoprazole (PROTONIX) 40 MG tablet Take 1 tablet (40 mg total) by mouth daily. 30 tablet 3   Vitamin D , Ergocalciferol , (DRISDOL ) 1.25 MG (50000 UNIT) CAPS capsule Take 1 capsule (50,000 Units total) by mouth every 7 (seven) days. 27 capsule 1   cetirizine  (ZYRTEC ) 10 MG tablet Take 1 tablet (10 mg total) by mouth daily. 30 tablet 5   No facility-administered medications prior to visit.    Allergies  Allergen Reactions   Latex Hives, Itching and Other (See Comments)    Reaction:  Burning    Review of Systems  Constitutional:  Negative for chills and fever.  Eyes:  Negative for visual disturbance.  Respiratory:  Negative for chest tightness and shortness of breath.   Neurological:  Negative for dizziness and headaches.       Objective:    Physical  Exam HENT:     Head: Normocephalic.     Mouth/Throat:     Mouth: Mucous membranes are moist.  Cardiovascular:     Rate and Rhythm: Normal rate.     Heart sounds: Normal heart sounds.  Pulmonary:     Effort: Pulmonary effort is normal.     Breath sounds: Normal breath sounds.  Neurological:     Mental Status: She is alert.     BP 138/83   Pulse (!) 103   Resp  16   Ht 5' 3.5 (1.613 m)   Wt 197 lb 1.9 oz (89.4 kg)   LMP 05/27/2024 (Exact Date)   SpO2 95%   BMI 34.37 kg/m  Wt Readings from Last 3 Encounters:  06/27/24 197 lb 1.9 oz (89.4 kg)  06/24/24 197 lb (89.4 kg)  06/17/24 200 lb (90.7 kg)       Assessment & Plan:  Epigastric pain Assessment & Plan: Encouraged to continue the treatment regimen until she follows up with GI. Encouraged to avoid acidic, fatty, and spicy foods. Blended diet encouraged.    Allergic cough -     Cetirizine  HCl; Take 1 tablet (10 mg total) by mouth daily.  Dispense: 30 tablet; Refill: 5  Note: This chart has been completed using Engineer, civil (consulting) software, and while attempts have been made to ensure accuracy, certain words and phrases may not be transcribed as intended.    Cinthya Bors  Z Bacchus, FNP

## 2024-06-29 DIAGNOSIS — R1013 Epigastric pain: Secondary | ICD-10-CM | POA: Insufficient documentation

## 2024-06-29 NOTE — Assessment & Plan Note (Signed)
 Encouraged to continue the treatment regimen until she follows up with GI. Encouraged to avoid acidic, fatty, and spicy foods. Blended diet encouraged.

## 2024-07-03 ENCOUNTER — Ambulatory Visit: Admitting: Family Medicine

## 2024-07-11 ENCOUNTER — Encounter: Payer: Self-pay | Admitting: Gastroenterology

## 2024-07-11 ENCOUNTER — Other Ambulatory Visit: Payer: Self-pay | Admitting: *Deleted

## 2024-07-11 ENCOUNTER — Encounter (INDEPENDENT_AMBULATORY_CARE_PROVIDER_SITE_OTHER): Payer: Self-pay

## 2024-07-11 ENCOUNTER — Telehealth: Payer: Self-pay | Admitting: *Deleted

## 2024-07-11 ENCOUNTER — Ambulatory Visit: Admitting: Gastroenterology

## 2024-07-11 VITALS — BP 126/87 | HR 88 | Temp 99.0°F | Ht 63.0 in | Wt 199.4 lb

## 2024-07-11 DIAGNOSIS — K5900 Constipation, unspecified: Secondary | ICD-10-CM

## 2024-07-11 DIAGNOSIS — R140 Abdominal distension (gaseous): Secondary | ICD-10-CM

## 2024-07-11 DIAGNOSIS — R1013 Epigastric pain: Secondary | ICD-10-CM | POA: Diagnosis not present

## 2024-07-11 DIAGNOSIS — E559 Vitamin D deficiency, unspecified: Secondary | ICD-10-CM

## 2024-07-11 DIAGNOSIS — E669 Obesity, unspecified: Secondary | ICD-10-CM

## 2024-07-11 DIAGNOSIS — K529 Noninfective gastroenteritis and colitis, unspecified: Secondary | ICD-10-CM

## 2024-07-11 MED ORDER — LINACLOTIDE 145 MCG PO CAPS: 145.0000 ug | ORAL_CAPSULE | Freq: Every day | ORAL | Status: DC

## 2024-07-11 NOTE — Progress Notes (Signed)
 Gastroenterology Office Note    Referring Provider: Duanne Butler DASEN, MD Primary Care Physician:  Dr. Duanne Primary GI: Dr Cindie   Chief Complaint   Chief Complaint  Patient presents with   Abdominal Pain    States that when she eats she gets full fast and then her stomach feels like she just ate at a buffet.      History of Present Illness   UNNAMED HINO is a 33 y.o. female presenting today at the request of Dr. Duanne due to epigastric pain. No prior GI evaluation.   Prior history of symptoms off and on over the years with early satiety and abdominal pain postprandially but just passed it off.   One month ago started having recurrent symptoms of early satiety, has back pain, feels very full. Has adapted food intake so as not to eat so much. Feels bloated and distended with eating. Epigastric discomfort that radiated to back. A few times of solid food dysphagia. Intermittent indigestion/reflux with symptoms. Associated nausea. Prescribed pantoprazole by PCP. Symptoms improving since that time. Still feeling full. No NSAIDs.   Constipation: had been taking Miralax without ideal results. Not predictable. Would have diarrhea with miralax then good results. Has BM daily. However, since having issue with stomach, feels not as productive stools and could empty more. No rectal bleeding. Doesn't drink lots of water. Drinks because she knows she should.   Difficult losing weight. Stuck at 180. Wlll lose weight down to 180 and then stop. Even though not eating much, still gaining weight.   Vit D 17.8 in Aug 2025, previously as low as 10. 2, had been prescribed Vit D 50,000 units weekly. Not taking any longer, taking OTC.   Had been to the [facility] and then felt full, had abdominal pain and then had several loose stools. Ate some chicken tenders. No one else got sick. CT with enteritis Sept 2025.   Would like colonoscopy   No FH colon cancer or polyps that she is aware No IBD  that she is aware    CT abd/pelvis with contrast small bowel loops IMPRESSION: 1. Mild wall thickening throughout several mid to distal small bowel loops, suggesting mild nonspecific infectious or inflammatory enteritis. No bowel obstruction. 2. Hypodense 3.1 cm left adnexal lesion, indeterminate. Recommend further evaluation with pelvic ultrasound. 3. Mildly heterogeneous and slightly irregular uterus, cannot exclude small uterine fibroids. 4. Small volume simple free fluid in the pelvic cul-de-sac. 5.  Aortic Atherosclerosis (ICD10-I70.0).      Past Medical History:  Diagnosis Date   Headaches, cluster    History of gestational hypertension 02/09/2017   History of prior pregnancy with IUGR newborn 03/20/2017   3lb14oz @ 37wks      Hypertension    Mono exposure    Premenstrual dysphoric disorder    UTI (lower urinary tract infection)     Past Surgical History:  Procedure Laterality Date   NO PAST SURGERIES     WISDOM TOOTH EXTRACTION Bilateral     Current Outpatient Medications  Medication Sig Dispense Refill   amLODipine  (NORVASC ) 10 MG tablet Take 1 tablet by mouth once daily 180 tablet 0   amoxicillin -clavulanate (AUGMENTIN ) 875-125 MG tablet Take 1 tablet by mouth every 12 (twelve) hours. 10 tablet 0   ASHWAGANDHA PO Take by mouth.     cetirizine  (ZYRTEC ) 10 MG tablet Take 1 tablet (10 mg total) by mouth daily. 30 tablet 5   cholecalciferol (VITAMIN D3) 25 MCG (1000 UNIT)  tablet Take 1,000 Units by mouth daily.     Cyanocobalamin (VITAMIN B-12 PO) Take by mouth.     cyclobenzaprine  (FLEXERIL ) 5 MG tablet Take 1 tablet (5 mg total) by mouth 3 (three) times daily as needed for muscle spasms. 30 tablet 1   losartan -hydrochlorothiazide (HYZAAR) 50-12.5 MG tablet Take 1 tablet by mouth once daily 180 tablet 0   Norethindrone -Ethinyl Estradiol-Fe Biphas (LO LOESTRIN FE ) 1 MG-10 MCG / 10 MCG tablet Take 1 tablet by mouth daily. Take 1 daily by mouth 84 tablet 4    ondansetron  (ZOFRAN -ODT) 8 MG disintegrating tablet Take 1 tablet (8 mg total) by mouth every 8 (eight) hours as needed for nausea or vomiting. 10 tablet 0   pantoprazole (PROTONIX) 40 MG tablet Take 1 tablet (40 mg total) by mouth daily. 30 tablet 3   Vitamin D , Ergocalciferol , (DRISDOL ) 1.25 MG (50000 UNIT) CAPS capsule Take 1 capsule (50,000 Units total) by mouth every 7 (seven) days. 27 capsule 1   No current facility-administered medications for this visit.    Allergies as of 07/11/2024 - Review Complete 07/11/2024  Allergen Reaction Noted   Latex Hives, Itching, and Other (See Comments) 09/10/2011    Family History  Problem Relation Age of Onset   Diabetes Mother    Fibromyalgia Mother    Hypertension Mother    Congestive Heart Failure Father    Narcolepsy Father    Aneurysm Father    Congestive Heart Failure Paternal Uncle    Diabetes Maternal Grandmother    Congestive Heart Failure Maternal Grandmother    Diabetes Maternal Grandfather    Congestive Heart Failure Maternal Grandfather    Diabetes Paternal Grandmother    Diabetes Paternal Grandfather    Congestive Heart Failure Paternal Grandfather    Colon cancer Neg Hx    Breast cancer Neg Hx     Social History   Socioeconomic History   Marital status: Single    Spouse name: Not on file   Number of children: 1   Years of education: Not on file   Highest education level: Some college, no degree  Occupational History   Occupation: Scientist, product/process development    Comment: [employer]  Tobacco Use   Smoking status: Every Day    Current packs/day: 0.50    Average packs/day: 0.5 packs/day for 5.0 years (2.5 ttl pk-yrs)    Types: Cigarettes   Smokeless tobacco: Never  Vaping Use   Vaping status: Never Used  Substance and Sexual Activity   Alcohol use: Yes    Comment: rarely in social setting; maybe once per month   Drug use: Yes    Types: Marijuana    Comment: socially   Sexual activity: Not Currently    Birth  control/protection: Condom  Other Topics Concern   Not on file  Social History Narrative   Lives with her child, not currently.    Social Drivers of Corporate Investment Banker Strain: Low Risk  (12/22/2022)   Overall Financial Resource Strain (CARDIA)    Difficulty of Paying Living Expenses: Not very hard  Food Insecurity: No Food Insecurity (12/22/2022)   Hunger Vital Sign    Worried About Running Out of Food in the Last Year: Never true    Ran Out of Food in the Last Year: Never true  Transportation Needs: No Transportation Needs (12/22/2022)   PRAPARE - Administrator, Civil Service (Medical): No    Lack of Transportation (Non-Medical): No  Recent Concern: Transportation Needs -  Unmet Transportation Needs (10/18/2022)   PRAPARE - Transportation    Lack of Transportation (Medical): Yes    Lack of Transportation (Non-Medical): Yes  Physical Activity: Insufficiently Active (12/22/2022)   Exercise Vital Sign    Days of Exercise per Week: 4 days    Minutes of Exercise per Session: 30 min  Stress: Stress Concern Present (12/22/2022)   [employer] of Occupational Health - Occupational Stress Questionnaire    Feeling of Stress : To some extent  Social Connections: Socially Isolated (12/22/2022)   Social Connection and Isolation Panel    Frequency of Communication with Friends and Family: More than three times a week    Frequency of Social Gatherings with Friends and Family: Once a week    Attends Religious Services: Never    Database Administrator or Organizations: No    Attends Engineer, Structural: Not on file    Marital Status: Never married  Intimate Partner Violence: Not At Risk (10/18/2022)   Humiliation, Afraid, Rape, and Kick questionnaire    Fear of Current or Ex-Partner: No    Emotionally Abused: No    Physically Abused: No    Sexually Abused: No     Review of Systems   Gen: Denies any fever, chills, fatigue, weight loss, lack of appetite.   CV: Denies chest pain, heart palpitations, peripheral edema, syncope.  Resp: Denies shortness of breath at rest or with exertion. Denies wheezing or cough.  GI: Denies dysphagia or odynophagia. Denies jaundice, hematemesis, fecal incontinence. GU : Denies urinary burning, urinary frequency, urinary hesitancy MS: Denies joint pain, muscle weakness, cramps, or limitation of movement.  Derm: Denies rash, itching, dry skin Psych: Denies depression, anxiety, memory loss, and confusion Heme: Denies bruising, bleeding, and enlarged lymph nodes.   Physical Exam   BP 126/87 (BP Location: Right Arm, Patient Position: Sitting, Cuff Size: Large)   Pulse 88   Temp 99 F (37.2 C) (Oral)   Ht 5' 3 (1.6 m)   Wt 199 lb 6.4 oz (90.4 kg)   LMP 05/27/2024 (Exact Date)   SpO2 98%   BMI 35.32 kg/m  General:   Alert and oriented. Pleasant and cooperative. Well-nourished and well-developed.  Head:  Normocephalic and atraumatic. Eyes:  Without icterus Ears:  Normal auditory acuity. Lungs:  Clear to auscultation bilaterally.  Heart:  S1, S2 present without murmurs appreciated.  Abdomen:  +BS, soft, non-tender and non-distended. No HSM noted. No guarding or rebound. No masses appreciated.  Rectal:  Deferred  Msk:  Symmetrical without gross deformities. Normal posture. Extremities:  Without edema. Neurologic:  Alert and  oriented x4;  grossly normal neurologically. Skin:  Intact without significant lesions or rashes. Psych:  Alert and cooperative. Normal mood and affect.  Lab Results  Component Value Date   ALT 23 06/17/2024   AST 18 06/17/2024   ALKPHOS 49 06/17/2024   BILITOT 0.5 06/17/2024   Lab Results  Component Value Date   NA 139 06/17/2024   CL 108 06/17/2024   K 4.2 06/17/2024   CO2 20 (L) 06/17/2024   BUN 18 06/17/2024   CREATININE 0.72 06/17/2024   GFRNONAA >60 06/17/2024   CALCIUM 8.7 (L) 06/17/2024   ALBUMIN 3.6 06/17/2024   GLUCOSE 93 06/17/2024   Lab Results  Component  Value Date   WBC 7.7 06/17/2024   HGB 12.7 06/17/2024   HCT 40.5 06/17/2024   MCV 86.9 06/17/2024   PLT 429 (H) 06/17/2024   Lab Results  Component Value  Date   LIPASE 78 (H) 06/24/2024     Assessment   Bethany Gomez is a 33 y.o. female presenting today at the request of Dr. Duanne due to epigastric pain.   Dyspepsia: chronically over the years with early satiety, postprandial abdominal pain, short-lived. Now has one month of recurrent symptoms including early satiety, back pain,  bloating postprandial, epigastric pain radiating to back, some GERD symptoms, and notes mild improvement with pantoprazole that was started by PCP. Differentials including gastritis, PUD, unable to exclude biliary etiology, doubt pancreatitis as CT imaging normal. Mildly elevated lipase at 77 is nonspecific and can be elevated in other etiologies. Constipation could also be contributing. Will start with labs, diagnostic EGD, and may need US  gallblader.  Dysphagia: solid food, suspect uncontrolled GERD contributing, possible esophagitis, web, ring, etc. Dilation at time of EGD.  Constipation: failing Miralax and unpredictable. Trial Linzess 145 mcg daily.   Difficulty losing weight:BMI 35.  frustrating to patient and would like referral for dedicated evaluation.   Vit D deficiency: recheck today and anticipate further weekly dosing.   Enteritis on CT in Sept 2025: I suspect this was r/t acute gastroenteritis as had eaten at the [facility] and subsequently became sick. Doubt underlying IBD.   She would like a colonoscopy; no overt GI bleeding, no FH colon cancer or polyps.      PLAN   Continue pantoprazole once a day  Start Linzess 145 mcg daily  Labs: CRP, sed rate, alpha gal, celiac panel  Proceed with colonoscopy/EGD/dlation by Dr. Cindie  in near future: the risks, benefits, and alternatives have been discussed with the patient in detail. The patient states understanding and desires to  proceed.  Pregnancy screen prior  Referral to [facility]  3 month follow-up   Therisa MICAEL Stager, PhD, ANP-BC [facility]

## 2024-07-11 NOTE — Telephone Encounter (Signed)
 LMOVM to return call  Ileocolonoscopy/EGD w/Dr.Carver, asa 2 UPT

## 2024-07-11 NOTE — Progress Notes (Signed)
 Medication Samples have been provided to the patient.  Drug name: Linzess       Strength: 145 mcg        Qty: 3  LOT: [id number]  Exp.Date: 12/24/2024  Dosing instructions: Take 1 capsule daily 30 mins before breakfast  The patient has been instructed regarding the correct time, dose, and frequency of taking this medication, including desired effects and most common side effects.   Madelin CHRISTELLA Boom 12:42 PM 07/11/2024

## 2024-07-11 NOTE — Patient Instructions (Signed)
 Continue pantoprazole once a day.   I have started you on Linzess at the middle dose for constipation. Linzess works best when taken once a day every day, on an empty stomach, at least 30 minutes before your first meal of the day.  When Linzess is taken daily as directed:  *Constipation relief is typically felt in about a week *IBS-C patients may begin to experience relief from belly pain and overall abdominal symptoms (pain, discomfort, and bloating) in about 1 week,   with symptoms typically improving over 12 weeks.  Diarrhea may occur in the first 2 weeks -keep taking it.  The diarrhea should go away and you should start having normal, complete, full bowel movements. It may be helpful to start treatment when you can be near the comfort of your own bathroom, such as a weekend.   Let me know how you do with this, so we can send in a prescription!  Please have blood work done; I will be in touch on MyChart with results  We are arranging a colonoscopy/endoscopy with Dr. Cindie in the near future!  I am referring you to [facility]  We will see you in 3 months or sooner if needed!  It was a pleasure to see you today. I want to create trusting relationships with patients and provide genuine, compassionate, and quality care. I truly value your feedback, so please be on the lookout for a survey regarding your visit with me today. I appreciate your time in completing this!         Therisa MICAEL Stager, PhD, ANP-BC [facility]

## 2024-07-17 NOTE — Telephone Encounter (Signed)
 LMOVM to return call.

## 2024-07-18 ENCOUNTER — Other Ambulatory Visit: Payer: Self-pay | Admitting: *Deleted

## 2024-07-18 DIAGNOSIS — R1013 Epigastric pain: Secondary | ICD-10-CM

## 2024-07-18 DIAGNOSIS — R194 Change in bowel habit: Secondary | ICD-10-CM

## 2024-07-18 MED ORDER — PEG 3350-KCL-NA BICARB-NACL 420 G PO SOLR: 4000.0000 mL | Freq: Once | ORAL | 0 refills | Status: AC

## 2024-07-18 NOTE — Addendum Note (Signed)
 Addended by: GAYLENE MADELIN CROME on: 07/18/2024 11:19 AM   Modules accepted: Orders

## 2024-07-21 LAB — SEDIMENTATION RATE: Sed Rate: 48 mm/h — ABNORMAL HIGH (ref 0–32)

## 2024-07-21 LAB — ALPHA-GAL PANEL
Allergen Lamb IgE: <0.1 kU/L
Beef IgE: <0.1 kU/L
IgE (Immunoglobulin E), Serum: 107 [IU]/mL (ref 6–495)
O215-IgE Alpha-Gal: <0.1 kU/L
Pork IgE: <0.1 kU/L

## 2024-07-21 LAB — CELIAC DISEASE PANEL
Endomysial IgA: NEGATIVE
IgA/Immunoglobulin A, Serum: 251 mg/dL (ref 87–352)
Transglutaminase IgA: <2 U/mL (ref 0–3)

## 2024-07-21 LAB — C-REACTIVE PROTEIN: CRP: 9 mg/L (ref 0–10)

## 2024-07-21 LAB — VITAMIN D 25 HYDROXY (VIT D DEFICIENCY, FRACTURES): Vit D, 25-Hydroxy: 16.3 ng/mL — ABNORMAL LOW (ref 30.0–100.0)

## 2024-07-24 ENCOUNTER — Ambulatory Visit: Admitting: Adult Health

## 2024-07-24 ENCOUNTER — Encounter: Payer: Self-pay | Admitting: Adult Health

## 2024-07-24 VITALS — BP 129/84 | HR 78 | Ht 63.5 in | Wt 199.5 lb

## 2024-07-24 DIAGNOSIS — N83202 Unspecified ovarian cyst, left side: Secondary | ICD-10-CM

## 2024-07-24 NOTE — Progress Notes (Signed)
  Subjective:     Patient ID: Bethany Gomez, female   DOB: 01/28/1991, 33 y.o.   MRN: [id number]  HPI Bethany Gomez is a 33 year old black female, single, G1P1001 back in follow up on taking lo Loestrin , but did not start yet, having GI issues. She had US   06/17/24 that  showed left follicular cyst and she wants to discuss that.     Component Value Date/Time   DIAGPAP (A) 10/18/2022 1128    - Atypical squamous cells of undetermined significance (ASC-US )   HPVHIGH Negative 10/18/2022 1128   ADEQPAP  10/18/2022 1128    Satisfactory for evaluation; transformation zone component PRESENT.   PCP is Meade Gerlach NP Review of Systems Not having sex currently Having GI issues, has pain, has seen GI Reviewed past medical,surgical, social and family history. Reviewed medications and allergies.     Objective:   Physical Exam BP 129/84 (BP Location: Left Arm, Patient Position: Sitting, Cuff Size: Normal)   Pulse 78   Ht 5' 3.5 (1.613 m)   Wt 199 lb 8 oz (90.5 kg)   LMP 06/18/2024   BMI 34.79 kg/m     Skin warm and dry.Lungs: clear to ausculation bilaterally. Cardiovascular: regular rate and rhythm.  Review US  with her: Uterus was normal, right ovary was normal has, 3.2 cm left ovarian cyst, likely follicular.   Fall risk is low  Upstream - 07/24/24 0954       Pregnancy Intention Screening   Does the patient want to become pregnant in the next year? No    Does the patient's partner want to become pregnant in the next year? No    Would the patient like to discuss contraceptive options today? No      Contraception Wrap Up   Current Method Abstinence    End Method Abstinence;Oral Contraceptive          Assessment:     1. Cyst of left ovary, follicular cyst seen on US  (Primary) No follow up needed, it should resolve on its own She says she may start lo Loestrin  in near future, and when she does will call for 3 month follow up then.     Plan:     Follow up prn

## 2024-08-05 ENCOUNTER — Ambulatory Visit: Admitting: Family Medicine

## 2024-08-07 ENCOUNTER — Other Ambulatory Visit: Payer: Self-pay

## 2024-08-07 ENCOUNTER — Encounter (HOSPITAL_COMMUNITY): Payer: Self-pay

## 2024-08-07 ENCOUNTER — Encounter (HOSPITAL_COMMUNITY)
Admission: RE | Admit: 2024-08-07 | Discharge: 2024-08-07 | Disposition: A | Discharge status: Home / Self Care | Source: Ambulatory Visit | Attending: Internal Medicine | Admitting: Internal Medicine

## 2024-08-07 VITALS — Ht 63.5 in | Wt 199.5 lb

## 2024-08-07 DIAGNOSIS — Z01818 Encounter for other preprocedural examination: Secondary | ICD-10-CM

## 2024-08-07 HISTORY — DX: Nausea with vomiting, unspecified: R11.2

## 2024-08-12 ENCOUNTER — Ambulatory Visit (HOSPITAL_COMMUNITY)
Admission: RE | Admit: 2024-08-12 | Discharge: 2024-08-12 | Disposition: A | Discharge status: Home / Self Care | Attending: Internal Medicine | Admitting: Internal Medicine

## 2024-08-12 ENCOUNTER — Ambulatory Visit (HOSPITAL_COMMUNITY): Admitting: Anesthesiology

## 2024-08-12 ENCOUNTER — Encounter (HOSPITAL_COMMUNITY)
Admission: RE | Disposition: A | Discharge status: Home / Self Care | Payer: Self-pay | Source: Home / Self Care | Attending: Internal Medicine

## 2024-08-12 ENCOUNTER — Other Ambulatory Visit: Payer: Self-pay

## 2024-08-12 ENCOUNTER — Encounter (HOSPITAL_COMMUNITY): Payer: Self-pay | Admitting: Internal Medicine

## 2024-08-12 DIAGNOSIS — K208 Other esophagitis without bleeding: Secondary | ICD-10-CM | POA: Diagnosis not present

## 2024-08-12 DIAGNOSIS — K648 Other hemorrhoids: Secondary | ICD-10-CM

## 2024-08-12 DIAGNOSIS — I10 Essential (primary) hypertension: Secondary | ICD-10-CM | POA: Insufficient documentation

## 2024-08-12 DIAGNOSIS — K2210 Ulcer of esophagus without bleeding: Secondary | ICD-10-CM | POA: Diagnosis not present

## 2024-08-12 DIAGNOSIS — R194 Change in bowel habit: Secondary | ICD-10-CM

## 2024-08-12 DIAGNOSIS — F17210 Nicotine dependence, cigarettes, uncomplicated: Secondary | ICD-10-CM | POA: Insufficient documentation

## 2024-08-12 DIAGNOSIS — K635 Polyp of colon: Secondary | ICD-10-CM

## 2024-08-12 DIAGNOSIS — K2970 Gastritis, unspecified, without bleeding: Secondary | ICD-10-CM | POA: Diagnosis not present

## 2024-08-12 DIAGNOSIS — K5730 Diverticulosis of large intestine without perforation or abscess without bleeding: Secondary | ICD-10-CM

## 2024-08-12 DIAGNOSIS — D125 Benign neoplasm of sigmoid colon: Secondary | ICD-10-CM

## 2024-08-12 DIAGNOSIS — R6881 Early satiety: Secondary | ICD-10-CM | POA: Diagnosis not present

## 2024-08-12 DIAGNOSIS — Z8719 Personal history of other diseases of the digestive system: Secondary | ICD-10-CM | POA: Insufficient documentation

## 2024-08-12 DIAGNOSIS — K449 Diaphragmatic hernia without obstruction or gangrene: Secondary | ICD-10-CM

## 2024-08-12 DIAGNOSIS — Z01818 Encounter for other preprocedural examination: Secondary | ICD-10-CM

## 2024-08-12 DIAGNOSIS — R1013 Epigastric pain: Secondary | ICD-10-CM | POA: Diagnosis present

## 2024-08-12 HISTORY — PX: COLONOSCOPY: SHX5424

## 2024-08-12 HISTORY — PX: ESOPHAGOGASTRODUODENOSCOPY: SHX5428

## 2024-08-12 LAB — POCT PREGNANCY, URINE: Preg Test, Ur: NEGATIVE

## 2024-08-12 SURGERY — COLONOSCOPY
Anesthesia: General

## 2024-08-12 MED ORDER — PROPOFOL 500 MG/50ML IV EMUL
INTRAVENOUS | Status: DC | PRN
  Administered 2024-08-12: 150 ug/kg/min via INTRAVENOUS

## 2024-08-12 MED ORDER — LIDOCAINE 2% (20 MG/ML) 5 ML SYRINGE
INTRAMUSCULAR | Status: DC | PRN
  Administered 2024-08-12: 80 mg via INTRAVENOUS

## 2024-08-12 MED ORDER — LACTATED RINGERS IV SOLN: INTRAVENOUS | Status: DC

## 2024-08-12 MED ORDER — PROPOFOL 10 MG/ML IV BOLUS
INTRAVENOUS | Status: DC | PRN
  Administered 2024-08-12 (×3): 50 mg via INTRAVENOUS
  Administered 2024-08-12: 100 mg via INTRAVENOUS

## 2024-08-12 MED ORDER — PANTOPRAZOLE SODIUM 40 MG PO TBEC: 40.0000 mg | DELAYED_RELEASE_TABLET | Freq: Two times a day (BID) | ORAL | 11 refills | Status: AC

## 2024-08-12 NOTE — Discharge Instructions (Addendum)
 EGD Discharge instructions Please read the instructions outlined below and refer to this sheet in the next few weeks. These discharge instructions provide you with general information on caring for yourself after you leave the hospital. Your doctor may also give you specific instructions. While your treatment has been planned according to the most current medical practices available, unavoidable complications occasionally occur. If you have any problems or questions after discharge, please call your doctor. ACTIVITY You may resume your regular activity but move at a slower pace for the next 24 hours.  Take frequent rest periods for the next 24 hours.  Walking will help expel (get rid of) the air and reduce the bloated feeling in your abdomen.  No driving for 24 hours (because of the anesthesia (medicine) used during the test).  You may shower.  Do not sign any important legal documents or operate any machinery for 24 hours (because of the anesthesia used during the test).  NUTRITION Drink plenty of fluids.  You may resume your normal diet.  Begin with a light meal and progress to your normal diet.  Avoid alcoholic beverages for 24 hours or as instructed by your caregiver.  MEDICATIONS You may resume your normal medications unless your caregiver tells you otherwise.  WHAT YOU CAN EXPECT TODAY You may experience abdominal discomfort such as a feeling of fullness or "gas" pains.  FOLLOW-UP Your doctor will discuss the results of your test with you.  SEEK IMMEDIATE MEDICAL ATTENTION IF ANY OF THE FOLLOWING OCCUR: Excessive nausea (feeling sick to your stomach) and/or vomiting.  Severe abdominal pain and distention (swelling).  Trouble swallowing.  Temperature over 101 F (37.8 C).  Rectal bleeding or vomiting of blood.      Colonoscopy Discharge Instructions  Read the instructions outlined below and refer to this sheet in the next few weeks. These discharge instructions provide you  with general information on caring for yourself after you leave the hospital. Your doctor may also give you specific instructions. While your treatment has been planned according to the most current medical practices available, unavoidable complications occasionally occur.   ACTIVITY You may resume your regular activity, but move at a slower pace for the next 24 hours.  Take frequent rest periods for the next 24 hours.  Walking will help get rid of the air and reduce the bloated feeling in your belly (abdomen).  No driving for 24 hours (because of the medicine (anesthesia) used during the test).   Do not sign any important legal documents or operate any machinery for 24 hours (because of the anesthesia used during the test).  NUTRITION Drink plenty of fluids.  You may resume your normal diet as instructed by your doctor.  Begin with a light meal and progress to your normal diet. Heavy or fried foods are harder to digest and may make you feel sick to your stomach (nauseated).  Avoid alcoholic beverages for 24 hours or as instructed.  MEDICATIONS You may resume your normal medications unless your doctor tells you otherwise.  WHAT YOU CAN EXPECT TODAY Some feelings of bloating in the abdomen.  Passage of more gas than usual.  Spotting of blood in your stool or on the toilet paper.  IF YOU HAD POLYPS REMOVED DURING THE COLONOSCOPY: No aspirin products for 7 days or as instructed.  No alcohol for 7 days or as instructed.  Eat a soft diet for the next 24 hours.  FINDING OUT THE RESULTS OF YOUR TEST Not all test results  are available during your visit. If your test results are not back during the visit, make an appointment with your caregiver to find out the results. Do not assume everything is normal if you have not heard from your caregiver or the medical facility. It is important for you to follow up on all of your test results.  SEEK IMMEDIATE MEDICAL ATTENTION IF: You have more than a  spotting of blood in your stool.  Your belly is swollen (abdominal distention).  You are nauseated or vomiting.  You have a temperature over 101.  You have abdominal pain or discomfort that is severe or gets worse throughout the day.   Your EGD revealed mild amount inflammation in your stomach.  I took biopsies of this to rule out infection with a bacteria called H. pylori. You also had a small hiatal hernia as well as evidence of reflux esophagitis.  I took samples of your esophagus as well.  Small bowel was normal.  I am going to increase your pantoprazole to twice daily for the next 12 weeks at which point you can decrease back down to once daily as tolerated.  Overall, your colon appeared very healthy.  I did not see any active inflammation indicative of underlying inflammatory bowel disease such as Crohn's disease or ulcerative colitis throughout your colon or end portion of your small bowel.    Your colonoscopy revealed 2 small, likely benign polyp(s) which I removed successfully. Await pathology results, my office will contact you. I recommend repeating colonoscopy at age 53 for colon cancer screening purposes, depending on pathology results.  You also have diverticulosis and internal hemorrhoids. I would recommend increasing fiber in your diet or adding OTC Benefiber/Metamucil. Be sure to drink at least 4 to 6 glasses of water daily.   Follow-up with GI as previously scheduled.    I hope you have a great rest of your week!  Carlin POUR. Cindie, D.O. Gastroenterology and Hepatology [facility]

## 2024-08-12 NOTE — H&P (Addendum)
 Primary Care Physician:  Edman Meade PEDLAR, FNP Primary Gastroenterologist:  Dr. Cindie  Pre-Procedure History & Physical: HPI:  Bethany Gomez is a 33 y.o. female is here for an EGD with possible dilation due to history of dysphagia, epigastric pain and colonoscopy for history of enteritis, change in bowel habits.   Past Medical History:  Diagnosis Date   Headaches, cluster    History of gestational hypertension 02/09/2017   History of prior pregnancy with IUGR newborn 03/20/2017   3lb14oz @ 37wks      Hypertension    Mono exposure    PONV (postoperative nausea and vomiting)    Premenstrual dysphoric disorder    UTI (lower urinary tract infection)     Past Surgical History:  Procedure Laterality Date   NO PAST SURGERIES     WISDOM TOOTH EXTRACTION Bilateral     Prior to Admission medications   Medication Sig Start Date End Date Taking? Authorizing Provider  amLODipine  (NORVASC ) 10 MG tablet Take 1 tablet by mouth once daily 04/08/24  Yes Bacchus, [name] Z, FNP  linaclotide [facility]) 145 MCG CAPS capsule Take 1 capsule (145 mcg total) by mouth daily before breakfast. 07/11/24  Yes Shirlean Therisa ORN, NP  losartan -hydrochlorothiazide (HYZAAR) 50-12.5 MG tablet Take 1 tablet by mouth once daily 06/10/24  Yes Bacchus, Meade PEDLAR, FNP  pantoprazole (PROTONIX) 40 MG tablet Take 1 tablet (40 mg total) by mouth daily. 06/24/24  Yes Duanne Butler DASEN, MD  ASHWAGANDHA PO Take by mouth.    [provider]  cetirizine  (ZYRTEC ) 10 MG tablet Take 1 tablet (10 mg total) by mouth daily. 06/27/24   Bacchus, [name] Z, FNP  Cyanocobalamin (VITAMIN B-12 PO) Take by mouth.    [provider]  cyclobenzaprine  (FLEXERIL ) 5 MG tablet Take 1 tablet (5 mg total) by mouth 3 (three) times daily as needed for muscle spasms. 04/01/24   Bacchus, Meade PEDLAR, FNP  Norethindrone -Ethinyl Estradiol-Fe Biphas (LO LOESTRIN FE ) 1 MG-10 MCG / 10 MCG tablet Take 1 tablet by mouth daily. Take 1 daily by mouth Patient  not taking: Reported on 07/24/2024 04/23/24   Signa Delon LABOR, NP  ondansetron  (ZOFRAN -ODT) 8 MG disintegrating tablet Take 1 tablet (8 mg total) by mouth every 8 (eight) hours as needed for nausea or vomiting. 06/17/24   Bernard Drivers, MD    Allergies as of 07/17/2024 - Review Complete 07/11/2024  Allergen Reaction Noted   Latex Hives, Itching, and Other (See Comments) 09/10/2011    Family History  Problem Relation Age of Onset   Diabetes Mother    Fibromyalgia Mother    Hypertension Mother    Congestive Heart Failure Father    Narcolepsy Father    Aneurysm Father    Congestive Heart Failure Paternal Uncle    Diabetes Maternal Grandmother    Congestive Heart Failure Maternal Grandmother    Diabetes Maternal Grandfather    Congestive Heart Failure Maternal Grandfather    Diabetes Paternal Grandmother    Diabetes Paternal Grandfather    Congestive Heart Failure Paternal Grandfather    Colon cancer Neg Hx    Breast cancer Neg Hx     Social History   Socioeconomic History   Marital status: Single    Spouse name: Not on file   Number of children: 1   Years of education: Not on file   Highest education level: Some college, no degree  Occupational History   Occupation: Scientist, product/process development    Comment: [employer]  Tobacco Use  Smoking status: Every Day    Current packs/day: 0.50    Average packs/day: 0.5 packs/day for 5.0 years (2.5 ttl pk-yrs)    Types: Cigarettes   Smokeless tobacco: Never  Vaping Use   Vaping status: Never Used  Substance and Sexual Activity   Alcohol use: Yes    Comment: rarely in social setting; maybe once per month   Drug use: Yes    Types: Marijuana    Comment: socially   Sexual activity: Not Currently    Birth control/protection: Condom  Other Topics Concern   Not on file  Social History Narrative   Lives with her child, not currently.    Social Drivers of Corporate Investment Banker Strain: Low Risk  (12/22/2022)   Overall Financial  Resource Strain (CARDIA)    Difficulty of Paying Living Expenses: Not very hard  Food Insecurity: No Food Insecurity (12/22/2022)   Hunger Vital Sign    Worried About Running Out of Food in the Last Year: Never true    Ran Out of Food in the Last Year: Never true  Transportation Needs: No Transportation Needs (12/22/2022)   PRAPARE - Administrator, Civil Service (Medical): No    Lack of Transportation (Non-Medical): No  Recent Concern: Transportation Needs - Unmet Transportation Needs (10/18/2022)   PRAPARE - Transportation    Lack of Transportation (Medical): Yes    Lack of Transportation (Non-Medical): Yes  Physical Activity: Insufficiently Active (12/22/2022)   Exercise Vital Sign    Days of Exercise per Week: 4 days    Minutes of Exercise per Session: 30 min  Stress: Stress Concern Present (12/22/2022)   [employer] of Occupational Health - Occupational Stress Questionnaire    Feeling of Stress : To some extent  Social Connections: Socially Isolated (12/22/2022)   Social Connection and Isolation Panel    Frequency of Communication with Friends and Family: More than three times a week    Frequency of Social Gatherings with Friends and Family: Once a week    Attends Religious Services: Never    Database Administrator or Organizations: No    Attends Engineer, Structural: Not on file    Marital Status: Never married  Intimate Partner Violence: Not At Risk (10/18/2022)   Humiliation, Afraid, Rape, and Kick questionnaire    Fear of Current or Ex-Partner: No    Emotionally Abused: No    Physically Abused: No    Sexually Abused: No    Review of Systems: General: Negative for fever, chills, fatigue, weakness. Eyes: Negative for vision changes.  ENT: Negative for hoarseness, difficulty swallowing , nasal congestion. CV: Negative for chest pain, angina, palpitations, dyspnea on exertion, peripheral edema.  Respiratory: Negative for dyspnea at rest, dyspnea on  exertion, cough, sputum, wheezing.  GI: See history of present illness. GU:  Negative for dysuria, hematuria, urinary incontinence, urinary frequency, nocturnal urination.  MS: Negative for joint pain, low back pain.  Derm: Negative for rash or itching.  Neuro: Negative for weakness, abnormal sensation, seizure, frequent headaches, memory loss, confusion.  Psych: Negative for anxiety, depression Endo: Negative for unusual weight change.  Heme: Negative for bruising or bleeding. Allergy: Negative for rash or hives.  Physical Exam: Vital signs in last 24 hours: Temp:  [98.5 F (36.9 C)] 98.5 F (36.9 C) (11/17 0724) Pulse Rate:  [79] 79 (11/17 0724) Resp:  [17] 17 (11/17 0724) BP: (151)/(98) 151/98 (11/17 0724) SpO2:  [100 %] 100 % (11/17 0724) Weight:  [  90.5 kg] 90.5 kg (11/17 0724)   General:   Alert,  Well-developed, well-nourished, pleasant and cooperative in NAD Head:  Normocephalic and atraumatic. Eyes:  Sclera clear, no icterus.   Conjunctiva pink. Ears:  Normal auditory acuity. Nose:  No deformity, discharge,  or lesions. Msk:  Symmetrical without gross deformities. Normal posture. Extremities:  Without clubbing or edema. Neurologic:  Alert and  oriented x4;  grossly normal neurologically. Skin:  Intact without significant lesions or rashes. Psych:  Alert and cooperative. Normal mood and affect.   Impression/Plan: Bethany Gomez is here for an EGD with possible dilation due to history of dysphagia, epigastric pain and colonoscopy for history of enteritis, change in bowel habits.   Risks, benefits, limitations, imponderables and alternatives regarding procedure have been reviewed with the patient. Questions have been answered. All parties agreeable.

## 2024-08-12 NOTE — Anesthesia Preprocedure Evaluation (Signed)
 Anesthesia Evaluation  Patient identified by MRN, date of birth, ID band Patient awake    Reviewed: Allergy & Precautions, H&P , NPO status , Patient's Chart, lab work & pertinent test results, reviewed documented beta blocker date and time   Airway Mallampati: II  TM Distance: >3 FB Neck ROM: full    Dental no notable dental hx. (+) Dental Advisory Given, Teeth Intact   Pulmonary Current Smoker   Pulmonary exam normal breath sounds clear to auscultation       Cardiovascular Exercise Tolerance: Good hypertension, Normal cardiovascular exam Rhythm:regular Rate:Normal     Neuro/Psych  Headaches PSYCHIATRIC DISORDERS  Depression       GI/Hepatic negative GI ROS, Neg liver ROS,,,  Endo/Other  negative endocrine ROS    Renal/GU negative Renal ROS  negative genitourinary   Musculoskeletal   Abdominal   Peds  Hematology negative hematology ROS (+)   Anesthesia Other Findings   Reproductive/Obstetrics negative OB ROS                              Anesthesia Physical Anesthesia Plan  ASA: 2  Anesthesia Plan: General   Post-op Pain Management: Minimal or no pain anticipated   Induction: Intravenous  PONV Risk Score and Plan: Propofol infusion  Airway Management Planned: Natural Airway and Nasal Cannula  Additional Equipment: None  Intra-op Plan:   Post-operative Plan:   Informed Consent: I have reviewed the patients History and Physical, chart, labs and discussed the procedure including the risks, benefits and alternatives for the proposed anesthesia with the patient or authorized representative who has indicated his/her understanding and acceptance.     Dental Advisory Given  Plan Discussed with: CRNA  Anesthesia Plan Comments:          Anesthesia Quick Evaluation

## 2024-08-12 NOTE — Op Note (Signed)
 [facility] Patient Name: Bethany Gomez Procedure Date: 08/12/2024 8:19 AM MRN: [id number] Date of Birth: August 11, 1991 Attending MD: Carlin POUR. Cindie , [place], [id number] CSN: [id number] Age: 33 Admit Type: Outpatient Procedure:                Colonoscopy Indications:              Change in bowel habits Providers:                Carlin POUR. Cindie, DO, Tammy Vaught, RN, Chad                            Wilson, Technician Referring MD:             Carlin POUR. Cindie, DO Medicines:                See the Anesthesia note for documentation of the                            administered medications Complications:            No immediate complications. Estimated Blood Loss:     Estimated blood loss was minimal. Procedure:                Pre-Anesthesia Assessment:                           - The anesthesia plan was to use monitored                            anesthesia care (MAC).                           After obtaining informed consent, the colonoscope                            was passed under direct vision. Throughout the                            procedure, the patient's blood pressure, pulse, and                            oxygen saturations were monitored continuously. The                            [id number]-[id number] ([id number]) Peds Colon was introduced                            through the anus and advanced to the the terminal                            ileum, with identification of the appendiceal                            orifice and IC valve. The colonoscopy was performed                            without difficulty.  The patient tolerated the                            procedure well. The quality of the bowel                            preparation was evaluated using the BBPS [facility]                            Bowel Preparation Scale) with scores of: Right                            Colon = 3, Transverse Colon = 3 and Left Colon = 3                            (entire mucosa seen well  with no residual staining,                            small fragments of stool or opaque liquid). The                            total BBPS score equals 9. Scope In: 8:44:33 AM Scope Out: 8:58:08 AM Scope Withdrawal Time: 0 hours 10 minutes 56 seconds  Total Procedure Duration: 0 hours 13 minutes 35 seconds  Findings:      Non-bleeding internal hemorrhoids were found.      Scattered medium-mouthed and small-mouthed diverticula were found in the       entire colon.      Two sessile polyps were found in the sigmoid colon. The polyps were 3 to       4 mm in size. These polyps were removed with a cold snare. Resection and       retrieval were complete.      The terminal ileum appeared normal.      The exam was otherwise without abnormality. Impression:               - Non-bleeding internal hemorrhoids.                           - Diverticulosis in the entire examined colon.                           - Two 3 to 4 mm polyps in the sigmoid colon,                            removed with a cold snare. Resected and retrieved.                           - The examined portion of the ileum was normal.                           - The examination was otherwise normal. Moderate Sedation:      Per Anesthesia Care Recommendation:           - Patient has a contact number available for  emergencies. The signs and symptoms of potential                            delayed complications were discussed with the                            patient. Return to normal activities tomorrow.                            Written discharge instructions were provided to the                            patient.                           - Resume previous diet.                           - Continue present medications.                           - Await pathology results.                           - Repeat colonoscopy at age 62 or sooner if higher                            risk for screening  purposes.                           - Return to GI clinic in 3 months. Procedure Code(s):        --- Professional ---                           [phone], Colonoscopy, flexible; with removal of                            tumor(s), polyp(s), or other lesion(s) by snare                            technique Diagnosis Code(s):        --- Professional ---                           K64.8, Other hemorrhoids                           D12.5, Benign neoplasm of sigmoid colon                           R19.4, Change in bowel habit                           K57.30, Diverticulosis of large intestine without                            perforation or abscess without  bleeding CPT copyright 2022 American Medical Association. All rights reserved. The codes documented in this report are preliminary and upon coder review may  be revised to meet current compliance requirements. Carlin POUR. Cindie, DO Carlin POUR. Bethany Kernen, DO 08/12/2024 9:02:01 AM This report has been signed electronically. Number of Addenda: 0

## 2024-08-12 NOTE — Op Note (Signed)
 [facility] Patient Name: Bethany Gomez Procedure Date: 08/12/2024 8:22 AM MRN: [id number] Date of Birth: 13-Mar-1991 Attending MD: Carlin POUR. Cindie , [place], [id number] CSN: [id number] Age: 33 Admit Type: Outpatient Procedure:                Upper GI endoscopy Indications:              Epigastric abdominal pain, Heartburn, Early satiety Providers:                Carlin POUR. Cindie, DO, Tammy Vaught, RN, Chad                            Wilson, Technician Referring MD:             Carlin POUR. Cindie, DO Medicines:                See the Anesthesia note for documentation of the                            administered medications Complications:            No immediate complications. Estimated Blood Loss:     Estimated blood loss was minimal. Procedure:                Pre-Anesthesia Assessment:                           - The anesthesia plan was to use monitored                            anesthesia care (MAC).                           After obtaining informed consent, the endoscope was                            passed under direct vision. Throughout the                            procedure, the patient's blood pressure, pulse, and                            oxygen saturations were monitored continuously. The                            [id number] ([id number]) Upper was introduced through                            the mouth, and advanced to the second part of                            duodenum. The upper GI endoscopy was accomplished                            without difficulty. The patient tolerated the  procedure well. Scope In: 8:35:11 AM Scope Out: 8:39:45 AM Total Procedure Duration: 0 hours 4 minutes 34 seconds  Findings:      LA Grade A (one or more mucosal breaks less than 5 mm, not extending       between tops of 2 mucosal folds) esophagitis with no bleeding was found       in the distal esophagus. Biopsies were taken with a cold forceps for        histology.      A 2-3 cm hiatal hernia was present.      Patchy mild inflammation was found in the entire examined stomach.       Biopsies were taken with a cold forceps for Helicobacter pylori testing.      The duodenal bulb, first portion of the duodenum and second portion of       the duodenum were normal. Impression:               - LA Grade A erosive esophagitis with no bleeding.                            Biopsied.                           - 2 cm hiatal hernia.                           - Gastritis. Biopsied.                           - Normal duodenal bulb, first portion of the                            duodenum and second portion of the duodenum. Moderate Sedation:      Per Anesthesia Care Recommendation:           - Patient has a contact number available for                            emergencies. The signs and symptoms of potential                            delayed complications were discussed with the                            patient. Return to normal activities tomorrow.                            Written discharge instructions were provided to the                            patient.                           - Resume previous diet.                           - Continue present medications.                           -  Await pathology results.                           - Use Protonix (pantoprazole) 40 mg PO BID for 12                            weeks then decrease down to once daily as tolerated.                           - Return to GI clinic in 3 months. Procedure Code(s):        --- Professional ---                           [phone], Esophagogastroduodenoscopy, flexible,                            transoral; with biopsy, single or multiple Diagnosis Code(s):        --- Professional ---                           K20.80, Other esophagitis without bleeding                           K44.9, Diaphragmatic hernia without obstruction or                            gangrene                            K29.70, Gastritis, unspecified, without bleeding                           R10.13, Epigastric pain                           R12, Heartburn                           R68.81, Early satiety CPT copyright 2022 American Medical Association. All rights reserved. The codes documented in this report are preliminary and upon coder review may  be revised to meet current compliance requirements. Carlin POUR. Cindie, DO Carlin POUR. Jenina Moening, DO 08/12/2024 8:42:09 AM This report has been signed electronically. Number of Addenda: 0

## 2024-08-12 NOTE — Transfer of Care (Signed)
 Immediate Anesthesia Transfer of Care Note  Patient: Bethany Gomez  Procedure(s) Performed: COLONOSCOPY EGD (ESOPHAGOGASTRODUODENOSCOPY)  Patient Location: Short Stay  Anesthesia Type:General  Level of Consciousness: awake  Airway & Oxygen Therapy: Patient Spontanous Breathing  Post-op Assessment: Report given to RN  Post vital signs: Reviewed and stable  Last Vitals:  Vitals Value Taken Time  BP 109/96 08/12/24 09:16  Temp 36.8 C 08/12/24 09:09  Pulse 97 08/12/24 09:09  Resp 11 08/12/24 09:09  SpO2 100 % 08/12/24 09:09    Last Pain:  Vitals:   08/12/24 0909  TempSrc: Oral  PainSc: 0-No pain         Complications: No notable events documented.

## 2024-08-12 NOTE — Anesthesia Postprocedure Evaluation (Signed)
 Anesthesia Post Note  Patient: Bethany Gomez  Procedure(s) Performed: COLONOSCOPY EGD (ESOPHAGOGASTRODUODENOSCOPY)  Patient location during evaluation: Phase II Anesthesia Type: General Level of consciousness: awake and alert Pain management: pain level controlled Vital Signs Assessment: post-procedure vital signs reviewed and stable Respiratory status: spontaneous breathing, nonlabored ventilation and respiratory function stable Cardiovascular status: stable Anesthetic complications: no   There were no known notable events for this encounter.   Last Vitals:  Vitals:   08/12/24 0909 08/12/24 0916  BP: 102/60 (!) 109/96  Pulse: 97   Resp: 11   Temp: 36.8 C   SpO2: 100%     Last Pain:  Vitals:   08/12/24 0909  TempSrc: Oral  PainSc: 0-No pain                 Zelina Jimerson L Kourtlynn Trevor

## 2024-08-14 ENCOUNTER — Ambulatory Visit: Payer: Self-pay | Admitting: Gastroenterology

## 2024-08-14 ENCOUNTER — Encounter (HOSPITAL_COMMUNITY): Payer: Self-pay | Admitting: Internal Medicine

## 2024-08-14 LAB — SURGICAL PATHOLOGY

## 2024-08-14 MED ORDER — VITAMIN D (ERGOCALCIFEROL) 1.25 MG (50000 UNIT) PO CAPS: 50000.0000 [IU] | ORAL_CAPSULE | ORAL | 0 refills | Status: AC

## 2024-08-26 ENCOUNTER — Other Ambulatory Visit: Payer: Self-pay

## 2024-08-26 DIAGNOSIS — R1013 Epigastric pain: Secondary | ICD-10-CM

## 2024-08-26 DIAGNOSIS — E559 Vitamin D deficiency, unspecified: Secondary | ICD-10-CM

## 2024-09-17 ENCOUNTER — Ambulatory Visit: Payer: Self-pay | Admitting: Internal Medicine

## 2024-09-27 ENCOUNTER — Ambulatory Visit: Payer: Self-pay | Admitting: Family Medicine

## 2024-10-16 ENCOUNTER — Ambulatory Visit: Admitting: Gastroenterology

## 2024-10-16 ENCOUNTER — Encounter: Payer: Self-pay | Admitting: Gastroenterology

## 2024-10-16 VITALS — BP 169/109 | HR 89 | Temp 97.5°F | Ht 63.5 in | Wt 208.6 lb

## 2024-10-16 DIAGNOSIS — R6881 Early satiety: Secondary | ICD-10-CM | POA: Diagnosis not present

## 2024-10-16 DIAGNOSIS — R140 Abdominal distension (gaseous): Secondary | ICD-10-CM | POA: Diagnosis not present

## 2024-10-16 DIAGNOSIS — E559 Vitamin D deficiency, unspecified: Secondary | ICD-10-CM | POA: Diagnosis not present

## 2024-10-16 DIAGNOSIS — K581 Irritable bowel syndrome with constipation: Secondary | ICD-10-CM | POA: Diagnosis not present

## 2024-10-16 DIAGNOSIS — K2100 Gastro-esophageal reflux disease with esophagitis, without bleeding: Secondary | ICD-10-CM | POA: Insufficient documentation

## 2024-10-16 MED ORDER — LINACLOTIDE 145 MCG PO CAPS: 145.0000 ug | ORAL_CAPSULE | Freq: Every day | ORAL | 0 refills | Status: AC

## 2024-10-16 MED ORDER — LINACLOTIDE 145 MCG PO CAPS: 145.0000 ug | ORAL_CAPSULE | Freq: Every day | ORAL | 3 refills | Status: AC

## 2024-10-16 NOTE — Progress Notes (Signed)
 "   Gastroenterology Office Note     Primary Care Physician:  Edman Meade PEDLAR, FNP  Primary Gastroenterologist: Dr Cindie   Chief Complaint   Chief Complaint  Patient presents with   Follow-up    Patient here today for a follow up from recent colonoscopy done on 08/12/2024. Patient says she gets full easily, and only eats around one meal per day. Patient says she had a bm yesterday and had a small amount of bright red blood on the tissue. She says the colonoscopy had showed she had a hemorrhoid. She says she has some irritation in the area and at times it itches. Denies any pain with this. Denies any other gi related issues.        History of Present Illness   Bethany Gomez is a 34 y.o. female presenting today with a history of dyspepsia, constipation, self-limiting enteritis on CT in Sept 2025, undergoing colonoscopy/EGD in interim from last visit in Oct 2025 as below.   Continues to note early satiety. Small amount of bright red blood after BM yesterday. Rectal itching intermittently. Feels bloated. Frustrated that she is gaining weight and now at highest weight she remembers. [facility] but still needs to make an appt.  BM daily, sometimes incomplete. Not as productive as she would like. Linzess  145 mcg actually helped but she ran out. Takes pantoprazole  daily to BID as needed. Occasional nausea.   Found to be Vit D deficient and was on weekly dosing. Needs updated labs. Alpha gal panel negative, celiac serologies negative. Sed rate elevated at 48 but CRP normal.      EGD Nov 2025  LA Grade A erosive esophagitis with no bleeding.                            Biopsied.                           - 2 cm hiatal hernia.                           - Gastritis. Biopsied.                           - Normal duodenal bulb, first portion of the                            duodenum and second portion of the duodenum.  Colonoscopy Nov 2025  Non-bleeding  internal hemorrhoids.                           - Diverticulosis in the entire examined colon.                           - Two 3 to 4 mm polyps in the sigmoid colon,                            removed with a cold snare. Resected and retrieved.                           - The examined portion of the  ileum was normal.                           - The examination was otherwise normal.  No FH IBD No FH colon cancer or polyps that he is aware   Past Surgical History:  Procedure Laterality Date   COLONOSCOPY N/A 08/12/2024   Procedure: COLONOSCOPY;  Surgeon: Cindie Carlin POUR, DO;  [facility]: [facility];  Service: Endoscopy;  Laterality: N/A;  8:30 am, asa 2  ILEO   ESOPHAGOGASTRODUODENOSCOPY N/A 08/12/2024   Procedure: EGD (ESOPHAGOGASTRODUODENOSCOPY);  Surgeon: Cindie Carlin POUR, DO;  Location: [facility];  Service: Endoscopy;  Laterality: N/A;   NO PAST SURGERIES     WISDOM TOOTH EXTRACTION Bilateral     Current Outpatient Medications  Medication Sig Dispense Refill   cyclobenzaprine  (FLEXERIL ) 5 MG tablet Take 1 tablet (5 mg total) by mouth 3 (three) times daily as needed for muscle spasms. 30 tablet 1   losartan -hydrochlorothiazide (HYZAAR) 50-12.5 MG tablet Take 1 tablet by mouth once daily 180 tablet 0   Norethindrone -Ethinyl Estradiol-Fe Biphas (LO LOESTRIN FE ) 1 MG-10 MCG / 10 MCG tablet Take 1 tablet by mouth daily. Take 1 daily by mouth 84 tablet 4   pantoprazole  (PROTONIX ) 40 MG tablet Take 1 tablet (40 mg total) by mouth 2 (two) times daily. 60 tablet 11   Vitamin D , Ergocalciferol , (DRISDOL ) 1.25 MG (50000 UNIT) CAPS capsule Take 1 capsule (50,000 Units total) by mouth every 7 (seven) days. 12 capsule 0   amLODipine  (NORVASC ) 10 MG tablet Take 1 tablet by mouth once daily (Patient not taking: Reported on 10/16/2024) 180 tablet 0   cetirizine  (ZYRTEC ) 10 MG tablet Take 1 tablet (10 mg total) by mouth daily. (Patient not taking: Reported on 10/16/2024) 30 tablet 5    linaclotide  (LINZESS ) 145 MCG CAPS capsule Take 1 capsule (145 mcg total) by mouth daily before breakfast. (Patient not taking: Reported on 10/16/2024)     No current facility-administered medications for this visit.    Allergies as of 10/16/2024 - Review Complete 10/16/2024  Allergen Reaction Noted   Latex Hives, Itching, and Other (See Comments) 09/10/2011    Family History  Problem Relation Age of Onset   Diabetes Mother    Fibromyalgia Mother    Hypertension Mother    Congestive Heart Failure Father    Narcolepsy Father    Aneurysm Father    Congestive Heart Failure Paternal Uncle    Diabetes Maternal Grandmother    Congestive Heart Failure Maternal Grandmother    Diabetes Maternal Grandfather    Congestive Heart Failure Maternal Grandfather    Diabetes Paternal Grandmother    Diabetes Paternal Grandfather    Congestive Heart Failure Paternal Grandfather    Colon cancer Neg Hx    Breast cancer Neg Hx     Social History   Socioeconomic History   Marital status: Single    Spouse name: Not on file   Number of children: 1   Years of education: Not on file   Highest education level: Some college, no degree  Occupational History   Occupation: Scientist, product/process development    Comment: [employer]  Tobacco Use   Smoking status: Every Day    Current packs/day: 0.50    Average packs/day: 0.5 packs/day for 5.0 years (2.5 ttl pk-yrs)    Types: Cigarettes   Smokeless tobacco: Never  Vaping Use   Vaping status: Never Used  Substance and Sexual Activity  Alcohol use: Yes    Comment: rarely in social setting; maybe once per month   Drug use: Yes    Types: Marijuana    Comment: socially   Sexual activity: Not Currently    Birth control/protection: Condom  Other Topics Concern   Not on file  Social History Narrative   Lives with her child, not currently.    Social Drivers of Health   Tobacco Use: High Risk (10/16/2024)   Patient History    Smoking Tobacco Use: Every Day     Smokeless Tobacco Use: Never    Passive Exposure: Not on file  Financial Resource Strain: Low Risk (12/22/2022)   Overall Financial Resource Strain (CARDIA)    Difficulty of Paying Living Expenses: Not very hard  Food Insecurity: No Food Insecurity (12/22/2022)   Hunger Vital Sign    Worried About Running Out of Food in the Last Year: Never true    Ran Out of Food in the Last Year: Never true  Transportation Needs: No Transportation Needs (12/22/2022)   PRAPARE - Administrator, Civil Service (Medical): No    Lack of Transportation (Non-Medical): No  Recent Concern: Transportation Needs - Unmet Transportation Needs (10/18/2022)   PRAPARE - Transportation    Lack of Transportation (Medical): Yes    Lack of Transportation (Non-Medical): Yes  Physical Activity: Insufficiently Active (12/22/2022)   Exercise Vital Sign    Days of Exercise per Week: 4 days    Minutes of Exercise per Session: 30 min  Stress: Stress Concern Present (12/22/2022)   [employer] of Occupational Health - Occupational Stress Questionnaire    Feeling of Stress : To some extent  Social Connections: Socially Isolated (12/22/2022)   Social Connection and Isolation Panel    Frequency of Communication with Friends and Family: More than three times a week    Frequency of Social Gatherings with Friends and Family: Once a week    Attends Religious Services: Never    Database Administrator or Organizations: No    Attends Engineer, Structural: Not on file    Marital Status: Never married  Intimate Partner Violence: Not At Risk (10/18/2022)   Humiliation, Afraid, Rape, and Kick questionnaire    Fear of Current or Ex-Partner: No    Emotionally Abused: No    Physically Abused: No    Sexually Abused: No  Depression (PHQ2-9): Medium Risk (06/27/2024)   Depression (PHQ2-9)    PHQ-2 Score: 5  Alcohol Screen: Low Risk (12/22/2022)   Alcohol Screen    Last Alcohol Screening Score (AUDIT): 2  Housing: Low  Risk (12/22/2022)   Housing    Last Housing Risk Score: 0  Utilities: Not At Risk (10/18/2022)   AHC Utilities    Threatened with loss of utilities: No  Health Literacy: Not on file     Review of Systems   Gen: Denies any fever, chills, fatigue, weight loss, lack of appetite.  CV: Denies chest pain, heart palpitations, peripheral edema, syncope.  Resp: Denies shortness of breath at rest or with exertion. Denies wheezing or cough.  GI: Denies dysphagia or odynophagia. Denies jaundice, hematemesis, fecal incontinence. GU : Denies urinary burning, urinary frequency, urinary hesitancy MS: Denies joint pain, muscle weakness, cramps, or limitation of movement.  Derm: Denies rash, itching, dry skin Psych: Denies depression, anxiety, memory loss, and confusion Heme: Denies bruising, bleeding, and enlarged lymph nodes.   Physical Exam   BP (!) 147/101 (BP Location: Left Arm, Patient Position: Sitting,  Cuff Size: Large)   Pulse 83   Temp (!) 97.5 F (36.4 C) (Temporal)   Ht 5' 3.5 (1.613 m)   Wt 208 lb 9.6 oz (94.6 kg)   BMI 36.37 kg/m  General:   Alert and oriented. Pleasant and cooperative. Well-nourished and well-developed.  Head:  Normocephalic and atraumatic. Eyes:  Without icterus Abdomen:  +BS, soft, non-tender and non-distended. No HSM noted. No guarding or rebound. No masses appreciated.  Rectal:  Deferred  Msk:  Symmetrical without gross deformities. Normal posture. Extremities:  Without edema. Neurologic:  Alert and  oriented x4;  grossly normal neurologically. Skin:  Intact without significant lesions or rashes. Psych:  Alert and cooperative. Normal mood and affect.   Assessment    IBS-C  Bloating: unable to rule out gastroparesis, does not appear biliary, negative celiac serologies and alpha gal panel.   Hx of enteritis: doubt dealing with underlying IBD and suspect self-limiting. If continued early satiety, bloating, could pursue CTE to further evaluate small  bowel.   HTN: didn't take oral meds today, asymptomatic, taking when gets home  VIt D deficiency   PLAN   PPI once daily GES in future Linzess  145 mcg daily. If persistent bloating or concerns and GES negative, could update CTE due to hx of enteritis but highly doubt dealing with IBD Recheck Vit D, may need additional weekly dosing ongoing 3 month follow-up   Therisa MICAEL Stager, PhD, ANP-BC [facility]    "

## 2024-10-16 NOTE — Patient Instructions (Addendum)
 Take pantoprazole  once daily, 30 minutes before breakfast.  We are arranging the gastric emptying study. We can consider a dedicated CT scan if worsening bloating any pain, etc.   I have given you samples of Linzess  145 mcg to get back on and sent this to your pharmacy!  Please have Vitamin D  lab drawn. We may need to extend the higher dosage for [facility].  The number for [facility]:  [phone]  We will see you in 3 months!   I enjoyed seeing you again today! I value our relationship and want to provide genuine, compassionate, and quality care. You may receive a survey regarding your visit with me, and I welcome your feedback! Thanks so much for taking the time to complete this. I look forward to seeing you again.      Therisa MICAEL Stager, PhD, ANP-BC [facility]

## 2024-10-23 ENCOUNTER — Telehealth (INDEPENDENT_AMBULATORY_CARE_PROVIDER_SITE_OTHER): Payer: Self-pay

## 2024-10-23 NOTE — Telephone Encounter (Signed)
 PA on Availity for GES:  Reference Number [id number]  Status PENDED  Review Reason 1 Initial Utilization Review In Progress.

## 2024-10-29 ENCOUNTER — Encounter: Payer: Self-pay | Admitting: *Deleted

## 2024-10-29 NOTE — Telephone Encounter (Signed)
 PA on Availity for GES: Certification Number [id number] Review Reason 1 Certification Not Required for this Service

## 2024-11-05 ENCOUNTER — Other Ambulatory Visit (HOSPITAL_COMMUNITY)

## 2024-11-07 ENCOUNTER — Ambulatory Visit: Payer: Self-pay | Admitting: Internal Medicine
